# Patient Record
Sex: Female | Born: 1945 | Race: White | Hispanic: No | Smoking: Never smoker
Health system: Southern US, Community
[De-identification: ages and names within clinical notes are randomized; demographics above are authoritative.]

## PROBLEM LIST (undated history)

## (undated) DIAGNOSIS — R32 Unspecified urinary incontinence: Secondary | ICD-10-CM

## (undated) DIAGNOSIS — F329 Major depressive disorder, single episode, unspecified: Secondary | ICD-10-CM

## (undated) DIAGNOSIS — G43909 Migraine, unspecified, not intractable, without status migrainosus: Secondary | ICD-10-CM

## (undated) DIAGNOSIS — Z8052 Family history of malignant neoplasm of bladder: Secondary | ICD-10-CM

## (undated) DIAGNOSIS — F419 Anxiety disorder, unspecified: Secondary | ICD-10-CM

## (undated) DIAGNOSIS — I10 Essential (primary) hypertension: Secondary | ICD-10-CM

## (undated) DIAGNOSIS — N390 Urinary tract infection, site not specified: Secondary | ICD-10-CM

## (undated) DIAGNOSIS — R300 Dysuria: Secondary | ICD-10-CM

## (undated) DIAGNOSIS — Z803 Family history of malignant neoplasm of breast: Secondary | ICD-10-CM

## (undated) DIAGNOSIS — I82409 Acute embolism and thrombosis of unspecified deep veins of unspecified lower extremity: Secondary | ICD-10-CM

## (undated) DIAGNOSIS — Z86718 Personal history of other venous thrombosis and embolism: Secondary | ICD-10-CM

## (undated) DIAGNOSIS — F32A Depression, unspecified: Secondary | ICD-10-CM

## (undated) DIAGNOSIS — C763 Malignant neoplasm of pelvis: Secondary | ICD-10-CM

## (undated) HISTORY — DX: Anxiety disorder, unspecified: F41.9

## (undated) HISTORY — DX: Acute embolism and thrombosis of unspecified deep veins of unspecified lower extremity: I82.409

## (undated) HISTORY — PX: TONSILECTOMY, ADENOIDECTOMY, BILATERAL MYRINGOTOMY AND TUBES: SHX2538

## (undated) HISTORY — DX: Urinary tract infection, site not specified: N39.0

## (undated) HISTORY — DX: Major depressive disorder, single episode, unspecified: F32.9

## (undated) HISTORY — DX: Unspecified urinary incontinence: R32

## (undated) HISTORY — DX: Dysuria: R30.0

## (undated) HISTORY — DX: Personal history of other venous thrombosis and embolism: Z86.718

## (undated) HISTORY — DX: Malignant neoplasm of pelvis: C76.3

## (undated) HISTORY — DX: Migraine, unspecified, not intractable, without status migrainosus: G43.909

## (undated) HISTORY — DX: Depression, unspecified: F32.A

## (undated) HISTORY — DX: Family history of malignant neoplasm of breast: Z80.3

## (undated) HISTORY — DX: Family history of malignant neoplasm of bladder: Z80.52

---

## 2008-01-03 HISTORY — PX: KNEE SURGERY: SHX244

## 2008-02-23 ENCOUNTER — Emergency Department: Payer: Self-pay | Admitting: Internal Medicine

## 2009-11-01 ENCOUNTER — Ambulatory Visit: Payer: Self-pay | Admitting: Family Medicine

## 2011-05-25 ENCOUNTER — Ambulatory Visit: Payer: Self-pay | Admitting: Family Medicine

## 2012-12-04 ENCOUNTER — Ambulatory Visit: Payer: Self-pay | Admitting: Family Medicine

## 2012-12-13 ENCOUNTER — Ambulatory Visit: Payer: Self-pay | Admitting: Family Medicine

## 2012-12-19 ENCOUNTER — Encounter: Payer: Self-pay | Admitting: General Surgery

## 2012-12-19 ENCOUNTER — Ambulatory Visit (INDEPENDENT_AMBULATORY_CARE_PROVIDER_SITE_OTHER): Payer: 59 | Admitting: General Surgery

## 2012-12-19 VITALS — BP 190/100 | HR 80 | Resp 14 | Ht 63.0 in | Wt 161.0 lb

## 2012-12-19 DIAGNOSIS — R920 Mammographic microcalcification found on diagnostic imaging of breast: Secondary | ICD-10-CM

## 2012-12-19 NOTE — Progress Notes (Signed)
Patient ID: Kathy Morton, female   DOB: [birth year], 67 y.o.   MRN: [id number]  Chief Complaint  Patient presents with  . Breast Problem    mammogram    HPI Kathy Morton is a 67 y.o. female.  who presents for a breast evaluation. The most recent mammogram was done on 12-13-12. Her previous mammogram was 2011. Patient does not perform regular self breast checks and does not get regular mammograms done.  She states she can't feel anything in the left breast and they told her it was calcium deposits.  HPI  Past Medical History  Diagnosis Date  . Anxiety   . Depression   . Migraine     Past Surgical History  Procedure Laterality Date  . Knee surgery Left 2010    Family History  Problem Relation Age of Onset  . Breast cancer Maternal Grandmother 80  . Heart attack Father   . Cancer Mother     bladder    Social History History  Substance Use Topics  . Smoking status: Never Smoker   . Smokeless tobacco: Never Used  . Alcohol Use: No    No Known Allergies  Current Outpatient Prescriptions  Medication Sig Dispense Refill  . clonazePAM (KLONOPIN) 1 MG tablet Take 1 tablet by mouth daily.      Marland Kitchen FLUoxetine (PROZAC) 20 MG tablet Take 1 tablet by mouth daily at 2 am.      . nitrofurantoin, macrocrystal-monohydrate, (MACROBID) 100 MG capsule Take 1 capsule by mouth daily at 3 pm.      . polyethylene glycol powder (GLYCOLAX/MIRALAX) powder       . QUEtiapine (SEROQUEL) 100 MG tablet Take 1 tablet by mouth daily at 3 pm.      . SUMAtriptan (IMITREX) 100 MG tablet Take 100 mg by mouth every 2 (two) hours as needed for migraine or headache. May repeat in 2 hours if headache persists or recurs.       No current facility-administered medications for this visit.    Review of Systems Review of Systems  Constitutional: Negative.   Respiratory: Negative.   Cardiovascular: Negative.     Blood pressure 190/100, pulse 80, resp. rate 14, height 5\' 3"  (1.6 m), weight 161 lb  (73.029 kg).  Physical Exam Physical Exam  Constitutional: She is oriented to person, place, and time. She appears well-developed and well-nourished.  Eyes: Conjunctivae are normal. No scleral icterus.  Neck: Neck supple.  Cardiovascular: Normal rate, regular rhythm and normal heart sounds.   Pulmonary/Chest: Effort normal and breath sounds normal. Right breast exhibits no inverted nipple, no mass, no nipple discharge, no skin change and no tenderness. Left breast exhibits no inverted nipple, no mass, no nipple discharge, no skin change and no tenderness.  Lymphadenopathy:    She has no cervical adenopathy.    She has no axillary adenopathy.  Neurological: She is alert and oriented to person, place, and time.  Skin: Skin is warm and dry.    Data Reviewed Mammogram reviewed shows new cluster of mildly suspicious of calcifications left breast.  Assessment    Physical exam is unremarkable.     Plan   Discussed stereotactic biopsy.  The stereotactic procedure was reviewed with the patient. The potential for bleeding, infection and pain was reviewed. At this time, the benefits outweigh the risk, and the patient is amenable to proceed.       SANKAR,SEEPLAPUTHUR G 12/20/2012, 5:49 AM

## 2012-12-19 NOTE — Patient Instructions (Signed)
Stereotactic Breast Biopsy   A stereotactic breast biopsy is a procedure in which mammography is used in the collection of a sample of breast tissue. Mammography is a type of X-ray exam of the breasts that produces an image called a mammogram. The mammogram allows your health care provider to precisely locate the area of the breast from which a tissue sample will be taken. The tissue is then examined under a microscope to see if cancerous cells are present. A breast biopsy is done when:   · A lump, abnormality, or mass is seen in the breast on a breast X-ray (mammogram).    · Small calcium deposits (calcifications) are seen in the breast.    · The shape or appearance of the breasts changes.    · The shape or appearance of the nipples changes. You may have unusual or bloody discharge coming from the nipples, or you may have crusting, retraction, or dimpling of the nipples.  A breast biopsy can indicate if you need surgery or other treatment.    LET YOUR HEALTH CARE PROVIDER KNOW ABOUT:  · Any allergies you have.  · All medicines you are taking, including vitamins, herbs, eye drops, creams, and over-the-counter medicines.  · Previous problems you or members of your family have had with the use of anesthetics.  · Any blood disorders you have.  · Previous surgeries you have had.  · Medical conditions you have.  RISKS AND COMPLICATIONS  Generally, stereotactic breast biopsy is a safe procedure. However, as with any procedure, complications can occur. Possible complications include:  · Infection at the needle-insertion site.    · Bleeding or bruising after surgery.  · The breast may become altered or deformed as a result of the procedure.  · The needle may go through the chest wall into the lung area.    BEFORE THE PROCEDURE  · Wear a supportive bra to the procedure.  · You will be asked to remove jewelry, dentures, eyeglasses, metal objects, or clothing that might interfere with the X-ray images. You may want to leave  some of these objects at home.  · Arrange for someone to drive you home after the procedure if desired.  PROCEDURE   A stereotactic breast biopsy is done while you are awake. During the procedure, relax as much as possible. Let your health care provider know if you are uncomfortable, anxious, or in pain. Usually, the only discomfort felt during the procedure is caused by staying in one position for the length of the procedure. This discomfort can be reduced by carefully placed cushions.  Most of the time the biopsy is done using a table with openings on it. You will be asked to lie facedown on the table and place your breasts through the openings. Your breast is compressed between metal plates to get good X-ray images. Your skin will be cleaned, and a numbing medicine (local anesthetic) will be injected. A small cut (incision) will be made in your breast. The tip of the biopsy needle will be directed through the incision. Several small pieces of suspicious tissue will be taken. Then, a final set of X-ray images will be obtained. If they show that the suspicious tissue has been mostly or completely removed, a small clip will be left at the biopsy site. This is done so that the biopsy site can be easily located if the results of the biopsy show that the tissue is cancerous.   After the procedure, the incision will be stitched (sutured) or taped and covered   with a bandage (dressing). Your health care provider may apply a pressure dressing and an ice pack to prevent bleeding and swelling in the breast.   A stereotactic breast biopsy can take 30 minutes or more.  AFTER THE PROCEDURE   If you are doing well and have no problems, you will be allowed to go home.   Document Released: 09/17/2002 Document Revised: 08/21/2012 Document Reviewed: 07/18/2012  [employer]® Patient Information ©2014 [employer].

## 2012-12-20 ENCOUNTER — Encounter: Payer: Self-pay | Admitting: General Surgery

## 2012-12-20 ENCOUNTER — Other Ambulatory Visit: Payer: Self-pay | Admitting: *Deleted

## 2012-12-20 DIAGNOSIS — R920 Mammographic microcalcification found on diagnostic imaging of breast: Secondary | ICD-10-CM

## 2012-12-20 NOTE — Progress Notes (Signed)
Patient has been scheduled for a left breast stereotactic biopsy at [facility] for 01-06-13 at 2 pm. She will check-in at the [facility] at 1:30 pm. This patient is aware of date, time, and instructions. Patient verbalizes understanding.

## 2012-12-25 ENCOUNTER — Ambulatory Visit: Payer: Self-pay | Admitting: General Surgery

## 2013-01-06 ENCOUNTER — Ambulatory Visit: Payer: Self-pay | Admitting: General Surgery

## 2013-01-06 DIAGNOSIS — R920 Mammographic microcalcification found on diagnostic imaging of breast: Secondary | ICD-10-CM

## 2013-01-06 HISTORY — PX: BREAST BIOPSY: SHX20

## 2013-01-07 LAB — PATHOLOGY REPORT

## 2013-01-08 ENCOUNTER — Telehealth: Payer: Self-pay | Admitting: *Deleted

## 2013-01-08 ENCOUNTER — Encounter: Payer: Self-pay | Admitting: General Surgery

## 2013-01-08 NOTE — Telephone Encounter (Signed)
Notified patient as instructed, patient pleased. Discussed follow-up appointments, patient agrees. Placed in recalls.  

## 2013-01-08 NOTE — Telephone Encounter (Signed)
Message copied by Carson Myrtle on Wed Jan 08, 2013  4:00 PM ------      Message from: Christene Lye      Created: Wed Jan 08, 2013  2:26 PM       Please let pt pt know the pathology was normal. Set up left breast diag mammogram in 5 mos-appt after ------

## 2013-01-13 ENCOUNTER — Ambulatory Visit (INDEPENDENT_AMBULATORY_CARE_PROVIDER_SITE_OTHER): Payer: 59 | Admitting: *Deleted

## 2013-01-13 DIAGNOSIS — R920 Mammographic microcalcification found on diagnostic imaging of breast: Secondary | ICD-10-CM

## 2013-01-13 NOTE — Progress Notes (Signed)
Patient here today for follow up post left breast biopsy. Dressing removed, steristrip in place and patient aware it may come off in one week. Large bruising noted. The patient is aware that a heating pad may be used for comfort as needed.  Aware of pathology. Patient placed in recalls for follow up in June.

## 2013-06-24 ENCOUNTER — Encounter: Payer: Self-pay | Admitting: General Surgery

## 2013-07-02 ENCOUNTER — Ambulatory Visit: Payer: 59 | Admitting: General Surgery

## 2013-07-08 ENCOUNTER — Ambulatory Visit (INDEPENDENT_AMBULATORY_CARE_PROVIDER_SITE_OTHER): Payer: 59 | Admitting: General Surgery

## 2013-07-08 ENCOUNTER — Encounter: Payer: Self-pay | Admitting: General Surgery

## 2013-07-08 VITALS — BP 126/72 | HR 74 | Resp 12 | Ht 63.0 in | Wt 171.0 lb

## 2013-07-08 DIAGNOSIS — R920 Mammographic microcalcification found on diagnostic imaging of breast: Secondary | ICD-10-CM

## 2013-07-08 NOTE — Progress Notes (Signed)
Patient ID: Kathy Morton, female   DOB: March 30, 1945, 68 y.o.   MRN: [id number]  Chief Complaint  Patient presents with  . Follow-up    mammogram    HPI Kathy Morton is a 68 y.o. female who presents for a breast evaluation. The most recent left breast mammogram was done on 06/20/13. Patient does perform regular self breast checks and gets regular mammograms done.  60mos ago she has core vbiopsy left breast-benign finding of sclerosing adenosis  HPI  Past Medical History  Diagnosis Date  . Anxiety   . Depression   . Migraine     Past Surgical History  Procedure Laterality Date  . Knee surgery Left 2010    Family History  Problem Relation Age of Onset  . Breast cancer Maternal Grandmother 80  . Heart attack Father   . Cancer Mother     bladder    Social History History  Substance Use Topics  . Smoking status: Never Smoker   . Smokeless tobacco: Never Used  . Alcohol Use: No    No Known Allergies  Current Outpatient Prescriptions  Medication Sig Dispense Refill  . clonazePAM (KLONOPIN) 1 MG tablet Take 1 tablet by mouth daily.      Marland Kitchen FLUoxetine (PROZAC) 20 MG tablet Take 1 tablet by mouth daily at 2 am.      . nitrofurantoin, macrocrystal-monohydrate, (MACROBID) 100 MG capsule Take 1 capsule by mouth daily at 3 pm.      . polyethylene glycol powder (GLYCOLAX/MIRALAX) powder       . QUEtiapine (SEROQUEL) 100 MG tablet Take 1 tablet by mouth daily at 3 pm.      . SUMAtriptan (IMITREX) 100 MG tablet Take 100 mg by mouth every 2 (two) hours as needed for migraine or headache. May repeat in 2 hours if headache persists or recurs.       No current facility-administered medications for this visit.    Review of Systems Review of Systems  Constitutional: Negative.   Respiratory: Negative.   Cardiovascular: Negative.     Blood pressure 126/72, pulse 74, resp. rate 12, height 5\' 3"  (1.6 m), weight 171 lb (77.565 kg).  Physical Exam Physical Exam   Constitutional: She is oriented to person, place, and time. She appears well-nourished.  Eyes: Conjunctivae are normal. No scleral icterus.  Neck: Neck supple.  Cardiovascular: Normal rate, regular rhythm and normal heart sounds.   Pulmonary/Chest: Effort normal and breath sounds normal. Right breast exhibits no inverted nipple, no mass, no nipple discharge, no skin change and no tenderness. Left breast exhibits no inverted nipple, no mass, no nipple discharge, no skin change and no tenderness.  Abdominal: Soft. Bowel sounds are normal. There is no tenderness.  Lymphadenopathy:    She has no cervical adenopathy.    She has no axillary adenopathy.  Neurological: She is alert and oriented to person, place, and time.  Skin: Skin is warm and dry.    Data Reviewed Mammogram reviewed. Stable.  Assessment    Stable exam, FCD, remote FH of breast cancer    Plan    The patient has been asked to return to the office in six month with a bilateral diagnotic mammogram.       Holleigh Crihfield G 07/08/2013, 3:38 PM

## 2013-07-08 NOTE — Patient Instructions (Signed)
The patient has been asked to return to the office in six month with a bilateral diagnotic mammogram.

## 2013-09-01 ENCOUNTER — Ambulatory Visit: Payer: Self-pay | Admitting: Internal Medicine

## 2013-11-03 ENCOUNTER — Encounter: Payer: Self-pay | Admitting: General Surgery

## 2014-02-09 ENCOUNTER — Encounter: Payer: Self-pay | Admitting: General Surgery

## 2014-02-09 ENCOUNTER — Ambulatory Visit (INDEPENDENT_AMBULATORY_CARE_PROVIDER_SITE_OTHER): Payer: Medicare Other | Admitting: General Surgery

## 2014-02-09 VITALS — BP 132/78 | HR 78 | Resp 15 | Ht 63.0 in | Wt 183.0 lb

## 2014-02-09 DIAGNOSIS — Z803 Family history of malignant neoplasm of breast: Secondary | ICD-10-CM

## 2014-02-09 DIAGNOSIS — N6022 Fibroadenosis of left breast: Secondary | ICD-10-CM

## 2014-02-09 NOTE — Patient Instructions (Signed)
Continue self breast exams. Call office for any new breast issues or concerns. 

## 2014-02-09 NOTE — Progress Notes (Signed)
Patient ID: Kathy Morton, female   DOB: 08/13/1945, 69 y.o.   MRN: [id number]  Chief Complaint  Patient presents with  . Follow-up    mammogram     HPI Kathy Morton is a 69 y.o. female  who presents for a breast evaluation. The most recent mammogram was done on 02/06/14. Patient does perform regular self breast checks and gets regular mammograms done. She reports no new breast problems.     HPI  Past Medical History  Diagnosis Date  . Anxiety   . Depression   . Migraine     Past Surgical History  Procedure Laterality Date  . Knee surgery Left 2010    torn meniscus  . Breast biopsy Left 01/06/13    benign finding of sclerosing adenosis    Family History  Problem Relation Age of Onset  . Breast cancer Maternal Grandmother 80  . Heart attack Father   . Cancer Mother     bladder    Social History History  Substance Use Topics  . Smoking status: Never Smoker   . Smokeless tobacco: Never Used  . Alcohol Use: No    No Known Allergies  Current Outpatient Prescriptions  Medication Sig Dispense Refill  . clonazePAM (KLONOPIN) 1 MG tablet Take 1 tablet by mouth daily.    Marland Kitchen FLUoxetine (PROZAC) 20 MG tablet Take 1 tablet by mouth daily at 2 am.    . glucosamine-chondroitin 500-400 MG tablet Take 1 tablet by mouth daily.    . QUEtiapine (SEROQUEL) 100 MG tablet Take 1 tablet by mouth daily at 3 pm.    . SUMAtriptan (IMITREX) 100 MG tablet Take 100 mg by mouth every 2 (two) hours as needed for migraine or headache. May repeat in 2 hours if headache persists or recurs.     No current facility-administered medications for this visit.    Review of Systems Review of Systems  Constitutional: Negative.   HENT: Negative.   Respiratory: Negative.     Blood pressure 132/78, pulse 78, resp. rate 15, height 5\' 3"  (1.6 m), weight 183 lb (83.008 kg).  Physical Exam Physical Exam  Constitutional: She is oriented to person, place, and time. She appears well-developed and  well-nourished.  Eyes: Conjunctivae are normal. No scleral icterus.  Neck: Neck supple. No thyromegaly present.  Cardiovascular: Normal rate, regular rhythm and normal heart sounds.   Pulmonary/Chest: Effort normal and breath sounds normal. Right breast exhibits no inverted nipple, no mass, no nipple discharge, no skin change and no tenderness. Left breast exhibits no inverted nipple, no mass, no nipple discharge, no skin change and no tenderness.  Abdominal: Soft. Bowel sounds are normal.  Lymphadenopathy:    She has no cervical adenopathy.    She has no axillary adenopathy.  Neurological: She is alert and oriented to person, place, and time.  Skin: Skin is warm and dry.    Data Reviewed Mammogram reviewed  Assessment    Stable Exam. 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 Family history of breast CA.    Plan    Schedule follow up in one year with screening mammogram.        SANKAR,SEEPLAPUTHUR G 02/09/2014, 11:18 AM

## 2014-02-10 ENCOUNTER — Encounter: Payer: Self-pay | Admitting: General Surgery

## 2014-04-27 ENCOUNTER — Ambulatory Visit (INDEPENDENT_AMBULATORY_CARE_PROVIDER_SITE_OTHER): Payer: Medicare Other

## 2014-04-27 ENCOUNTER — Ambulatory Visit (INDEPENDENT_AMBULATORY_CARE_PROVIDER_SITE_OTHER): Payer: Medicare Other | Admitting: Podiatry

## 2014-04-27 VITALS — BP 176/94 | HR 68 | Resp 16 | Ht 64.0 in | Wt 179.0 lb

## 2014-04-27 DIAGNOSIS — M722 Plantar fascial fibromatosis: Secondary | ICD-10-CM

## 2014-04-27 MED ORDER — METHYLPREDNISOLONE 4 MG PO TBPK: ORAL_TABLET | ORAL | Status: DC

## 2014-04-27 MED ORDER — MELOXICAM 15 MG PO TABS: 15.0000 mg | ORAL_TABLET | Freq: Every day | ORAL | Status: DC

## 2014-04-27 NOTE — Patient Instructions (Signed)

## 2014-04-27 NOTE — Progress Notes (Signed)
   Subjective:    Patient ID: Kathy Morton, female    DOB: [birth year], 69 y.o.   MRN: [id number]  HPI right plantar heel pain , it has been going on for about 3-4 weeks now, came on suddenly.  Had a heel spur thirty years ago. Have old orthotics.     Review of Systems  All other systems reviewed and are negative.      Objective:   Physical Exam: I have reviewed her past mental history medications allergies surgery social history and review of systems. Pulses are strongly palpable bilateral. Neurologic sensorium is intact percent lasting monofilament. Deep tendon reflexes are intact bilaterally muscle strength is 5 over 5 dorsiflexion plantar flexors and inverters everters all intrinsic musculature is intact. Orthopedic evaluation demonstrates all joints distal to the ankle full range of motion without crepitation. Cutaneous evaluation of Mr. supple well-hydrated cutis. She has pain on palpation medial calcaneal tubercle of the right heel. Radiographs confirm soft tissue increase in density of plantar fascial calcaneal insertion site.        Assessment & Plan:  Assessment: Plantar fasciitis right.  Plan: We discussed etiology pathology conservative versus surgical therapies. We discussed appropriate shoe gear stretching exercises ice therapy issue gear modifications area we injected the right heel today with Kenalog and local anesthetic. Placed her in my splint and a plantar fascial brace. Her on a Medrol Dosepak to be followed by meloxicam. I'll follow-up with her in 1 month.

## 2014-11-09 ENCOUNTER — Encounter: Payer: Self-pay | Admitting: Family Medicine

## 2014-11-09 ENCOUNTER — Ambulatory Visit (INDEPENDENT_AMBULATORY_CARE_PROVIDER_SITE_OTHER): Payer: Medicare Other | Admitting: Family Medicine

## 2014-11-09 VITALS — BP 154/104 | HR 93 | Temp 98.3°F | Resp 16 | Ht 64.0 in | Wt 182.6 lb

## 2014-11-09 DIAGNOSIS — R030 Elevated blood-pressure reading, without diagnosis of hypertension: Secondary | ICD-10-CM

## 2014-11-09 DIAGNOSIS — G43909 Migraine, unspecified, not intractable, without status migrainosus: Secondary | ICD-10-CM | POA: Insufficient documentation

## 2014-11-09 DIAGNOSIS — Z7189 Other specified counseling: Secondary | ICD-10-CM

## 2014-11-09 DIAGNOSIS — Z23 Encounter for immunization: Secondary | ICD-10-CM

## 2014-11-09 DIAGNOSIS — F4310 Post-traumatic stress disorder, unspecified: Secondary | ICD-10-CM | POA: Diagnosis not present

## 2014-11-09 DIAGNOSIS — Z8249 Family history of ischemic heart disease and other diseases of the circulatory system: Secondary | ICD-10-CM | POA: Diagnosis not present

## 2014-11-09 DIAGNOSIS — G43709 Chronic migraine without aura, not intractable, without status migrainosus: Secondary | ICD-10-CM | POA: Diagnosis not present

## 2014-11-09 DIAGNOSIS — Z7689 Persons encountering health services in other specified circumstances: Secondary | ICD-10-CM

## 2014-11-09 DIAGNOSIS — E669 Obesity, unspecified: Secondary | ICD-10-CM | POA: Diagnosis not present

## 2014-11-09 DIAGNOSIS — Z86718 Personal history of other venous thrombosis and embolism: Secondary | ICD-10-CM | POA: Insufficient documentation

## 2014-11-09 DIAGNOSIS — E6609 Other obesity due to excess calories: Secondary | ICD-10-CM | POA: Insufficient documentation

## 2014-11-09 DIAGNOSIS — R5382 Chronic fatigue, unspecified: Secondary | ICD-10-CM | POA: Diagnosis not present

## 2014-11-09 DIAGNOSIS — IMO0001 Reserved for inherently not codable concepts without codable children: Secondary | ICD-10-CM

## 2014-11-09 DIAGNOSIS — Z6833 Body mass index (BMI) 33.0-33.9, adult: Secondary | ICD-10-CM | POA: Insufficient documentation

## 2014-11-09 MED ORDER — CLONAZEPAM 1 MG PO TABS: 1.0000 mg | ORAL_TABLET | Freq: Every day | ORAL | Status: DC

## 2014-11-09 NOTE — Patient Instructions (Signed)
Your goal blood pressure is 140/90 Work on low salt/sodium diet - goal <2.5gm (1,500mg ) per day. Eat a diet high in fruits/vegetables and whole grains.  Look into mediterranean and DASH diet. Goal activity is 174min/wk of moderate intensity exercise.  This can be split into 30 minute chunks.  If you are not at this level, you can start with smaller 10-15 min increments and slowly build up activity. Look at [email or web address] for more resources
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
## 2014-11-09 NOTE — Assessment & Plan Note (Signed)
Continue current imitrex PRN for migraines.

## 2014-11-09 NOTE — Assessment & Plan Note (Signed)
40 years ago post partum. No additional issues.
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
## 2014-11-09 NOTE — Assessment & Plan Note (Signed)
Discussed weight loss strategies. Pt declined [facility]. Pt would like weight loss medication- risks benefits reviewed. Check TSH, lipid, B12.

## 2014-11-12 ENCOUNTER — Telehealth: Payer: Self-pay | Admitting: Family Medicine

## 2014-11-12 LAB — TSH: TSH: 2.16 u[IU]/mL (ref 0.450–4.500)

## 2014-11-12 LAB — COMPREHENSIVE METABOLIC PANEL
A/G RATIO: 1.8 (ref 1.1–2.5)
ALBUMIN: 4.7 g/dL (ref 3.6–4.8)
ALK PHOS: 112 IU/L (ref 39–117)
ALT: 14 IU/L (ref 0–32)
AST: 19 IU/L (ref 0–40)
BUN/Creatinine Ratio: 17 (ref 11–26)
BUN: 13 mg/dL (ref 8–27)
Bilirubin Total: <0.2 mg/dL (ref 0.0–1.2)
CHLORIDE: 100 mmol/L (ref 97–106)
CO2: 21 mmol/L (ref 18–29)
Calcium: 9.6 mg/dL (ref 8.7–10.3)
Creatinine, Ser: 0.76 mg/dL (ref 0.57–1.00)
GFR calc Af Amer: 93 mL/min/{1.73_m2} (ref >59)
GFR calc non Af Amer: 80 mL/min/{1.73_m2} (ref >59)
GLOBULIN, TOTAL: 2.6 g/dL (ref 1.5–4.5)
Glucose: 95 mg/dL (ref 65–99)
Potassium: 4 mmol/L (ref 3.5–5.2)
SODIUM: 141 mmol/L (ref 136–144)
Total Protein: 7.3 g/dL (ref 6.0–8.5)

## 2014-11-12 LAB — LIPID PANEL
CHOLESTEROL TOTAL: 242 mg/dL — AB (ref 100–199)
Chol/HDL Ratio: 4.5 ratio units — ABNORMAL HIGH (ref 0.0–4.4)
HDL: 54 mg/dL (ref >39)
LDL Calculated: 145 mg/dL — ABNORMAL HIGH (ref 0–99)
Triglycerides: 215 mg/dL — ABNORMAL HIGH (ref 0–149)
VLDL Cholesterol Cal: 43 mg/dL — ABNORMAL HIGH (ref 5–40)

## 2014-11-12 LAB — VITAMIN D 25 HYDROXY (VIT D DEFICIENCY, FRACTURES): Vit D, 25-Hydroxy: 29.4 ng/mL — ABNORMAL LOW (ref 30.0–100.0)

## 2014-11-12 LAB — VITAMIN B12: Vitamin B-12: 261 pg/mL (ref 211–946)

## 2014-11-12 NOTE — Telephone Encounter (Signed)
Pt requested a call explaining her lab results in detail.  Her call back number is [phone]

## 2014-11-12 NOTE — Telephone Encounter (Signed)
Explain cholesterol # to patient thoroughly. She was concerned and called back for more detail.

## 2014-12-07 ENCOUNTER — Ambulatory Visit: Payer: Medicare Other | Admitting: Family Medicine

## 2014-12-14 ENCOUNTER — Encounter: Payer: Self-pay | Admitting: Family Medicine

## 2014-12-14 ENCOUNTER — Telehealth: Payer: Self-pay | Admitting: Family Medicine

## 2014-12-14 ENCOUNTER — Ambulatory Visit (INDEPENDENT_AMBULATORY_CARE_PROVIDER_SITE_OTHER): Payer: Medicare Other | Admitting: Family Medicine

## 2014-12-14 VITALS — BP 160/98 | HR 92 | Temp 98.2°F | Resp 16 | Ht 64.0 in | Wt 180.2 lb

## 2014-12-14 DIAGNOSIS — E669 Obesity, unspecified: Secondary | ICD-10-CM | POA: Diagnosis not present

## 2014-12-14 DIAGNOSIS — G4700 Insomnia, unspecified: Secondary | ICD-10-CM

## 2014-12-14 DIAGNOSIS — K5909 Other constipation: Secondary | ICD-10-CM

## 2014-12-14 DIAGNOSIS — I10 Essential (primary) hypertension: Secondary | ICD-10-CM | POA: Diagnosis not present

## 2014-12-14 DIAGNOSIS — E785 Hyperlipidemia, unspecified: Secondary | ICD-10-CM | POA: Insufficient documentation

## 2014-12-14 DIAGNOSIS — K5900 Constipation, unspecified: Secondary | ICD-10-CM | POA: Diagnosis not present

## 2014-12-14 MED ORDER — LIRAGLUTIDE -WEIGHT MANAGEMENT 18 MG/3ML ~~LOC~~ SOPN: 3.0000 mg | PEN_INJECTOR | Freq: Every day | SUBCUTANEOUS | Status: DC

## 2014-12-14 MED ORDER — MELATONIN 3 MG PO CAPS: 2.0000 | ORAL_CAPSULE | Freq: Every day | ORAL | Status: DC

## 2014-12-14 MED ORDER — ATORVASTATIN CALCIUM 20 MG PO TABS: 20.0000 mg | ORAL_TABLET | Freq: Every day | ORAL | Status: DC

## 2014-12-14 MED ORDER — LUBIPROSTONE 24 MCG PO CAPS: 24.0000 ug | ORAL_CAPSULE | Freq: Two times a day (BID) | ORAL | Status: DC

## 2014-12-14 MED ORDER — LISINOPRIL 10 MG PO TABS: 10.0000 mg | ORAL_TABLET | Freq: Every day | ORAL | Status: DC

## 2014-12-14 NOTE — Progress Notes (Signed)
Subjective:    Patient ID: Kathy Morton, female    DOB: 08-13-1945, 69 y.o.   MRN: [id number]  HPI: Kathy Morton is a 69 y.o. female presenting on 12/14/2014 for Hypertension; Hyperlipidemia; and Obesity   Hypertension This is a new problem. The current episode started 1 to 4 weeks ago. The problem has been waxing and waning since onset. Pertinent negatives include no anxiety, chest pain, headaches, neck pain, orthopnea, palpitations, peripheral edema, shortness of breath or sweats. Risk factors for coronary artery disease include dyslipidemia and post-menopausal state. Past treatments include nothing. There is no history of chronic renal disease.  Hyperlipidemia This is a new problem. Recent lipid tests were reviewed and are high. Exacerbating diseases include obesity. She has no history of chronic renal disease or diabetes. Pertinent negatives include no chest pain, focal sensory loss, focal weakness, leg pain, myalgias or shortness of breath. She is currently on no antihyperlipidemic treatment. There are no compliance problems.  Risk factors for coronary artery disease include post-menopausal.    Pt presents for follow-up of BP. She has been checking BP at home- anywhere from 170/100 to 108-76.  Avg diastolic 123456. No CP, SOB, visual changes.   Obesity: 40 lb weight gain in the past few years. Is struggling with diet and exercise to keep the weight off. TSH is normal. B12 is noraml but low. SHe is exercising regularly and recently bought a treadmill.   Cholesterol medication- discuss starting statin. Cholesterol is high.  Chronic constipation- taking miralax. Would like to discuss amitiza to help with symptoms. 1 Bm every 2-3 days with use of miralax daily.  Insomnia- early morning awakenings. Is trying sleep hygiene.    Past Medical History  Diagnosis Date  . Anxiety   . Depression   . Migraine   . History of blood clots   . Incontinence     Current Outpatient  Prescriptions on File Prior to Visit  Medication Sig  . clonazePAM (KLONOPIN) 1 MG tablet Take 1 tablet (1 mg total) by mouth daily.  . polyethylene glycol powder (GLYCOLAX/MIRALAX) powder TAKE 17 GRAM MIXED WITH 8 OZ. WATER, JUICE, SODA, COFFEE OR TEA BY ORAL ROUTE ONCE DAILY  . QUEtiapine (SEROQUEL) 100 MG tablet Take 1 tablet by mouth daily at 3 pm.  . SUMAtriptan (IMITREX) 100 MG tablet Take 100 mg by mouth every 2 (two) hours as needed for migraine or headache. May repeat in 2 hours if headache persists or recurs.  . valACYclovir (VALTREX) 1000 MG tablet Take 2,000 mg by mouth every 12 (twelve) hours.   No current facility-administered medications on file prior to visit.    Review of Systems  Constitutional: Positive for unexpected weight change. Negative for fever and chills.  HENT: Negative.   Respiratory: Negative for shortness of breath.   Cardiovascular: Negative for chest pain, palpitations, orthopnea and leg swelling.  Gastrointestinal: Positive for constipation. Negative for abdominal pain and abdominal distention.  Endocrine: Negative for cold intolerance, heat intolerance, polydipsia, polyphagia and polyuria.  Genitourinary: Negative.   Musculoskeletal: Negative.  Negative for myalgias and neck pain.  Neurological: Negative for dizziness, focal weakness, numbness and headaches.  Psychiatric/Behavioral: Positive for sleep disturbance.   Per HPI unless specifically indicated above     Objective:    BP 160/98 mmHg  Pulse 92  Temp(Src) 98.2 F (36.8 C) (Oral)  Resp 16  Ht 5\' 4"  (1.626 m)  Wt 180 lb 3.2 oz (81.738 kg)  BMI 30.92 kg/m2  Wt Readings  from Last 3 Encounters:  12/14/14 180 lb 3.2 oz (81.738 kg)  11/09/14 182 lb 9.6 oz (82.827 kg)  04/27/14 179 lb (81.194 kg)    Physical Exam  Constitutional: She is oriented to person, place, and time. She appears well-developed and well-nourished.  HENT:  Head: Normocephalic and atraumatic.  Neck: Neck supple.    Cardiovascular: Normal rate, regular rhythm and normal heart sounds.  Exam reveals no gallop and no friction rub.   No murmur heard. Pulmonary/Chest: Effort normal and breath sounds normal. She has no wheezes. She exhibits no tenderness.  Abdominal: Soft. Normal appearance and bowel sounds are normal. She exhibits no distension and no mass. There is no tenderness. There is no rebound and no guarding.  Musculoskeletal: Normal range of motion. She exhibits no edema or tenderness.  Lymphadenopathy:    She has no cervical adenopathy.  Neurological: She is alert and oriented to person, place, and time.  Skin: Skin is warm and dry.   Results for orders placed or performed in visit on 11/09/14  Lipid panel  Result Value Ref Range   Cholesterol, Total 242 (H) 100 - 199 mg/dL   Triglycerides 215 (H) 0 - 149 mg/dL   HDL 54 >39 mg/dL   VLDL Cholesterol Cal 43 (H) 5 - 40 mg/dL   LDL Calculated 145 (H) 0 - 99 mg/dL   Chol/HDL Ratio 4.5 (H) 0.0 - 4.4 ratio units  Comprehensive metabolic panel  Result Value Ref Range   Glucose 95 65 - 99 mg/dL   BUN 13 8 - 27 mg/dL   Creatinine, Ser 0.76 0.57 - 1.00 mg/dL   GFR calc non Af Amer 80 >59 mL/min/1.73   GFR calc Af Amer 93 >59 mL/min/1.73   BUN/Creatinine Ratio 17 11 - 26   Sodium 141 136 - 144 mmol/L   Potassium 4.0 3.5 - 5.2 mmol/L   Chloride 100 97 - 106 mmol/L   CO2 21 18 - 29 mmol/L   Calcium 9.6 8.7 - 10.3 mg/dL   Total Protein 7.3 6.0 - 8.5 g/dL   Albumin 4.7 3.6 - 4.8 g/dL   Globulin, Total 2.6 1.5 - 4.5 g/dL   Albumin/Globulin Ratio 1.8 1.1 - 2.5   Bilirubin Total <0.2 0.0 - 1.2 mg/dL   Alkaline Phosphatase 112 39 - 117 IU/L   AST 19 0 - 40 IU/L   ALT 14 0 - 32 IU/L  TSH  Result Value Ref Range   TSH 2.160 0.450 - 4.500 uIU/mL  Vit D  25 hydroxy (rtn osteoporosis monitoring)  Result Value Ref Range   Vit D, 25-Hydroxy 29.4 (L) 30.0 - 100.0 ng/mL  Vitamin B12  Result Value Ref Range   Vitamin B-12 261 211 - 946 pg/mL       Assessment & Plan:   Problem List Items Addressed This Visit      Cardiovascular and Mediastinum   Hypertension - Primary    Start lisinopril today 10mg . Pt will continue to monitor BP at home. DASH diet and exercise reviewed.  Alarm symptoms reviewed. RTC 6 weeks- check BMET      Relevant Medications   lisinopril (PRINIVIL,ZESTRIL) 10 MG tablet   atorvastatin (LIPITOR) 20 MG tablet     Digestive   Chronic constipation    Trial of Amitza to help with constipation.       Relevant Medications   lubiprostone (AMITIZA) 24 MCG capsule     Other   Obesity    Trial of Saxenda for weight  loss x 6 weeks. Encouraged oral vitamin B12 and vitamin D for energy booster.  Consider referral to [facility].       Relevant Medications   Liraglutide -Weight Management (SAXENDA) 18 MG/3ML SOPN   Hyperlipidemia    Start Statin today. Risks and benefits reviewed. Diet and lifestyle changes reviewed.  Side effects of statins reviewed. Check Lipid and CMET in 6 weeks.       Relevant Medications   lisinopril (PRINIVIL,ZESTRIL) 10 MG tablet   atorvastatin (LIPITOR) 20 MG tablet   Insomnia    Continue seroquel to help with sleep. Augment with melatonin to reduce early morning awakenings.  Consider low dose anti-depressant to help with insomnia. Suspect underlying depression/anxiety.          Meds ordered this encounter  Medications  . lisinopril (PRINIVIL,ZESTRIL) 10 MG tablet    Sig: Take 1 tablet (10 mg total) by mouth daily.    Dispense:  90 tablet    Refill:  3    Order Specific Question:  Supervising Provider    Answer:  Arlis Porta [phone]  . atorvastatin (LIPITOR) 20 MG tablet    Sig: Take 1 tablet (20 mg total) by mouth daily.    Dispense:  90 tablet    Refill:  3    Order Specific Question:  Supervising Provider    Answer:  Arlis Porta [phone]  . Liraglutide -Weight Management (SAXENDA) 18 MG/3ML SOPN    Sig: Inject 3 mg into the skin daily.     Dispense:  18 mL    Refill:  11    Order Specific Question:  Supervising Provider    Answer:  Arlis Porta [phone]  . lubiprostone (AMITIZA) 24 MCG capsule    Sig: Take 1 capsule (24 mcg total) by mouth 2 (two) times daily with a meal.    Dispense:  60 capsule    Refill:  11    Order Specific Question:  Supervising Provider    Answer:  Arlis Porta [phone]  . Melatonin 3 MG CAPS    Sig: Take 2 capsules (6 mg total) by mouth daily. Take 1-2 capsules at bedtime.    Refill:  0    Order Specific Question:  Supervising Provider    Answer:  Arlis Porta [phone]      Follow up plan: Return in about 6 weeks (around 01/25/2015) for BP check, hyperlipidemia. Marland Kitchen

## 2014-12-14 NOTE — Assessment & Plan Note (Signed)
Continue seroquel to help with sleep. Augment with melatonin to reduce early morning awakenings.  Consider low dose anti-depressant to help with insomnia. Suspect underlying depression/anxiety.

## 2014-12-14 NOTE — Assessment & Plan Note (Signed)
Start lisinopril today 10mg . Pt will continue to monitor BP at home. DASH diet and exercise reviewed.  Alarm symptoms reviewed. RTC 6 weeks- check BMET

## 2014-12-14 NOTE — Assessment & Plan Note (Signed)
Start Statin today. Risks and benefits reviewed. Diet and lifestyle changes reviewed.  Side effects of statins reviewed. Check Lipid and CMET in 6 weeks.

## 2014-12-14 NOTE — Patient Instructions (Addendum)
[email or web address]  Your goal blood pressure is 150/90. Work on low salt/sodium diet - goal <1.5gm (1,500mg ) per day. Eat a diet high in fruits/vegetables and whole grains.  Look into mediterranean and DASH diet. Goal activity is 152min/wk of moderate intensity exercise.  This can be split into 30 minute chunks.  If you are not at this level, you can start with smaller 10-15 min increments and slowly build up activity. Look at [place].org for more resources  Please seek immediate medical attention at ER or Urgent Care if you develop: Chest pain, pressure or tightness. Shortness of breath accompanied by nausea or diaphoresis Visual changes Numbness or tingling on one side of the body Facial droop Altered mental status Or any concerning symptoms.    Try Melatonin for sleep- take 1-2 capsules at bedtime.

## 2014-12-14 NOTE — Telephone Encounter (Signed)
Pt has questions about the weight loss injection the you prescribed.  Please call [phone]

## 2014-12-14 NOTE — Assessment & Plan Note (Signed)
Trial of Amitza to help with constipation.

## 2014-12-14 NOTE — Assessment & Plan Note (Signed)
Trial of Saxenda for weight loss x 6 weeks. Encouraged oral vitamin B12 and vitamin D for energy booster.  Consider referral to [facility].

## 2014-12-15 NOTE — Telephone Encounter (Signed)
Pt called in regards to the copay for [place] it was $1000. Have told her unfortunately there is nothing we can do to help get her co-pay reduced. Encouraged her to work with pharmacy help desk with her insurance. Thanks! AK

## 2014-12-15 NOTE — Telephone Encounter (Signed)
Called pt. LMTCB 

## 2014-12-18 NOTE — Telephone Encounter (Signed)
Called pt to inform her that coverage for Saxenda was denied.

## 2014-12-22 ENCOUNTER — Encounter: Payer: Self-pay | Admitting: *Deleted

## 2015-02-01 ENCOUNTER — Ambulatory Visit (INDEPENDENT_AMBULATORY_CARE_PROVIDER_SITE_OTHER): Payer: Medicare Other | Admitting: Family Medicine

## 2015-02-01 VITALS — BP 133/85 | HR 99 | Temp 98.5°F | Resp 16 | Ht 64.0 in | Wt 183.0 lb

## 2015-02-01 DIAGNOSIS — I10 Essential (primary) hypertension: Secondary | ICD-10-CM | POA: Diagnosis not present

## 2015-02-01 DIAGNOSIS — E669 Obesity, unspecified: Secondary | ICD-10-CM

## 2015-02-01 MED ORDER — NALTREXONE-BUPROPION HCL ER 8-90 MG PO TB12: ORAL_TABLET | ORAL | Status: DC

## 2015-02-01 NOTE — Assessment & Plan Note (Signed)
Controlled in office. Check BMET for potassium levels. Recheck 3 mos.

## 2015-02-01 NOTE — Patient Instructions (Addendum)
Let's try the contrave for weight loss.  Week one: 1 pill in the AM Week two: 1 in AM and 1 in PM Week three: 2 pills in the AM, 1 pill in the PM Week four: 2 pills in the AM, 2 pills in the PM.     Please try to meet the goal of 150 minutes of exercise per week.  This is generally 30-40 minutes of moderate activity 3-4 times per week. Consider a calorie goal of 1800 per day. Eat mainly fruits, vegetables, and lean proteins (chicken or fish).  A great resource for healthy living and weight loss is [employer]gov

## 2015-02-01 NOTE — Progress Notes (Signed)
Subjective:    Patient ID: Kathy Morton, female    DOB: [birth year], 70 y.o.   MRN: [id number]  HPI: Kathy Morton is a 70 y.o. female presenting on 02/01/2015 for Hypertension and Obesity   HPI  Pt presents for hypertension follow-up. Overall doing well on 10mg  daily. No side effects. No CP, SOB,   She is very concerned about her weight. She wanted to try a weight loss medication at her last visit but she could not afford it. She has declined to go to [facility] multiple times. Exercise- 1 mile walking daily on treadmill. Diet: She is not trying anything for diet. When she does try- soup for dinner, salad for lunch, and toast for breakfast. Goal is 160 lbs. She is interested in weight loss medication.  No history of seizures.    Past Medical History  Diagnosis Date  . Anxiety   . Depression   . Migraine   . History of blood clots   . Incontinence     Current Outpatient Prescriptions on File Prior to Visit  Medication Sig  . atorvastatin (LIPITOR) 20 MG tablet Take 1 tablet (20 mg total) by mouth daily.  . clonazePAM (KLONOPIN) 1 MG tablet Take 1 tablet (1 mg total) by mouth daily.  Marland Kitchen lisinopril (PRINIVIL,ZESTRIL) 10 MG tablet Take 1 tablet (10 mg total) by mouth daily.  Marland Kitchen lubiprostone (AMITIZA) 24 MCG capsule Take 1 capsule (24 mcg total) by mouth 2 (two) times daily with a meal.  . Melatonin 3 MG CAPS Take 2 capsules (6 mg total) by mouth daily. Take 1-2 capsules at bedtime.  . polyethylene glycol powder (GLYCOLAX/MIRALAX) powder TAKE 17 GRAM MIXED WITH 8 OZ. WATER, JUICE, SODA, COFFEE OR TEA BY ORAL ROUTE ONCE DAILY  . QUEtiapine (SEROQUEL) 100 MG tablet Take 1 tablet by mouth daily at 3 pm.  . SUMAtriptan (IMITREX) 100 MG tablet Take 100 mg by mouth every 2 (two) hours as needed for migraine or headache. May repeat in 2 hours if headache persists or recurs.  . valACYclovir (VALTREX) 1000 MG tablet Take 2,000 mg by mouth every 12 (twelve) hours.   No current  facility-administered medications on file prior to visit.    Review of Systems  Constitutional: Negative for fever and chills.  HENT: Negative.   Respiratory: Negative for cough, chest tightness and wheezing.   Cardiovascular: Negative for chest pain and leg swelling.  Gastrointestinal: Negative for nausea, vomiting, abdominal pain, diarrhea and constipation.  Endocrine: Negative.  Negative for cold intolerance, heat intolerance, polydipsia, polyphagia and polyuria.  Genitourinary: Negative for dysuria and difficulty urinating.  Musculoskeletal: Negative.   Neurological: Negative for dizziness, light-headedness and numbness.  Psychiatric/Behavioral: Negative.    Per HPI unless specifically indicated above     Objective:    BP 133/85 mmHg  Pulse 99  Temp(Src) 98.5 F (36.9 C) (Oral)  Resp 16  Ht 5\' 4"  (1.626 m)  Wt 183 lb (83.008 kg)  BMI 31.40 kg/m2  Wt Readings from Last 3 Encounters:  02/01/15 183 lb (83.008 kg)  12/14/14 180 lb 3.2 oz (81.738 kg)  11/09/14 182 lb 9.6 oz (82.827 kg)    Physical Exam Results for orders placed or performed in visit on 11/09/14  Lipid panel  Result Value Ref Range   Cholesterol, Total 242 (H) 100 - 199 mg/dL   Triglycerides 215 (H) 0 - 149 mg/dL   HDL 54 >39 mg/dL   VLDL Cholesterol Cal 43 (H) 5 - 40 mg/dL  LDL Calculated 145 (H) 0 - 99 mg/dL   Chol/HDL Ratio 4.5 (H) 0.0 - 4.4 ratio units  Comprehensive metabolic panel  Result Value Ref Range   Glucose 95 65 - 99 mg/dL   BUN 13 8 - 27 mg/dL   Creatinine, Ser 0.76 0.57 - 1.00 mg/dL   GFR calc non Af Amer 80 >59 mL/min/1.73   GFR calc Af Amer 93 >59 mL/min/1.73   BUN/Creatinine Ratio 17 11 - 26   Sodium 141 136 - 144 mmol/L   Potassium 4.0 3.5 - 5.2 mmol/L   Chloride 100 97 - 106 mmol/L   CO2 21 18 - 29 mmol/L   Calcium 9.6 8.7 - 10.3 mg/dL   Total Protein 7.3 6.0 - 8.5 g/dL   Albumin 4.7 3.6 - 4.8 g/dL   Globulin, Total 2.6 1.5 - 4.5 g/dL   Albumin/Globulin Ratio 1.8 1.1 -  2.5   Bilirubin Total <0.2 0.0 - 1.2 mg/dL   Alkaline Phosphatase 112 39 - 117 IU/L   AST 19 0 - 40 IU/L   ALT 14 0 - 32 IU/L  TSH  Result Value Ref Range   TSH 2.160 0.450 - 4.500 uIU/mL  Vit D  25 hydroxy (rtn osteoporosis monitoring)  Result Value Ref Range   Vit D, 25-Hydroxy 29.4 (L) 30.0 - 100.0 ng/mL  Vitamin B12  Result Value Ref Range   Vitamin B-12 261 211 - 946 pg/mL      Assessment & Plan:   Problem List Items Addressed This Visit      Cardiovascular and Mediastinum   Hypertension    Controlled in office. Check BMET for potassium levels. Recheck 3 mos.       Relevant Orders   Basic Metabolic Panel (BMET)     Other   Obesity - Primary    Pt would like to try Contrave for weight loss. Discussed risks vs. Benefits. Encouraged diet and exercise changes.  Dosing reviewed. Pt to follow-up in 6 weeks.       Relevant Medications   Naltrexone-Bupropion HCl ER 8-90 MG TB12      Meds ordered this encounter  Medications  . Naltrexone-Bupropion HCl ER 8-90 MG TB12    Sig: Take 1 tab in the AM for 1 week. Take 2 tabs in the AM for 1 week. Take 2 in the AM a 1 PM for 1 week. Take 2 in the AM and 2 in the PM.    Dispense:  120 tablet    Refill:  1    Order Specific Question:  Supervising Provider    Answer:  Arlis Porta [phone]      Follow up plan: Return in about 5 weeks (around 03/08/2015) for weight loss. Marland Kitchen

## 2015-02-01 NOTE — Assessment & Plan Note (Signed)
Pt would like to try Contrave for weight loss. Discussed risks vs. Benefits. Encouraged diet and exercise changes.  Dosing reviewed. Pt to follow-up in 6 weeks.

## 2015-02-08 ENCOUNTER — Telehealth: Payer: Self-pay

## 2015-02-08 MED ORDER — POLYETHYLENE GLYCOL 3350 17 GM/SCOOP PO POWD: 17.0000 g | Freq: Every day | ORAL | Status: DC

## 2015-02-08 NOTE — Telephone Encounter (Signed)
Medication sent. Please let patient know it is there. Thanks!

## 2015-02-08 NOTE — Telephone Encounter (Signed)
Patient called requesting generic miralax to be sent to [facility]. Please advise patient.  She stated that she requested this and it was denied.

## 2015-02-09 ENCOUNTER — Encounter: Payer: Self-pay | Admitting: General Surgery

## 2015-02-15 ENCOUNTER — Ambulatory Visit (INDEPENDENT_AMBULATORY_CARE_PROVIDER_SITE_OTHER): Payer: Medicare Other | Admitting: General Surgery

## 2015-02-15 ENCOUNTER — Encounter: Payer: Self-pay | Admitting: General Surgery

## 2015-02-15 VITALS — BP 124/72 | HR 70 | Resp 16 | Ht 64.0 in | Wt 178.0 lb

## 2015-02-15 DIAGNOSIS — N6022 Fibroadenosis of left breast: Secondary | ICD-10-CM | POA: Diagnosis not present

## 2015-02-15 DIAGNOSIS — Z803 Family history of malignant neoplasm of breast: Secondary | ICD-10-CM | POA: Diagnosis not present

## 2015-02-15 NOTE — Patient Instructions (Signed)
Patient to return as needed. Continue self breast exams. Call office for any new breast issues or concerns.'  

## 2015-02-15 NOTE — Progress Notes (Signed)
Patient ID: Kathy Morton, female   DOB: 05/15/1945, 70 y.o.   MRN: [id number]  Chief Complaint  Patient presents with  . Follow-up    mammogram    HPI Kathy Morton is a 70 y.o. female.  who presents for a breast evaluation. The most recent mammogram was done on 02-08-15. Patient does perform regular self breast checks and gets regular mammograms done.   I have reviewed the history of present illness with the patient. HPI  Past Medical History  Diagnosis Date  . Anxiety   . Depression   . Migraine   . History of blood clots   . Incontinence     Past Surgical History  Procedure Laterality Date  . Knee surgery Left 2010    torn meniscus  . 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 Tonsilectomy, adenoidectomy, bilateral myringotomy and tubes      Family History  Problem Relation Age of Onset  . Breast cancer Maternal Grandmother 80  . Heart attack Father   . Cancer Mother     bladder    Social History Social History  Substance Use Topics  . Smoking status: Never Smoker   . Smokeless tobacco: Never Used  . Alcohol Use: No    No Known Allergies  Current Outpatient Prescriptions  Medication Sig Dispense Refill  . atorvastatin (LIPITOR) 20 MG tablet Take 1 tablet (20 mg total) by mouth daily. 90 tablet 3  . clonazePAM (KLONOPIN) 1 MG tablet Take 1 tablet (1 mg total) by mouth daily. 60 tablet 0  . lisinopril (PRINIVIL,ZESTRIL) 10 MG tablet Take 1 tablet (10 mg total) by mouth daily. 90 tablet 3  . Melatonin 3 MG CAPS Take 2 capsules (6 mg total) by mouth daily. Take 1-2 capsules at bedtime.  0  . Naltrexone-Bupropion HCl ER 8-90 MG TB12 Take 1 tab in the AM for 1 week. Take 2 tabs in the AM for 1 week. Take 2 in the AM a 1 PM for 1 week. Take 2 in the AM and 2 in the PM. 120 tablet 1  . polyethylene glycol powder (GLYCOLAX/MIRALAX) powder Take 17 g by mouth daily. 3350 g 11  . QUEtiapine (SEROQUEL) 100 MG tablet Take 1 tablet by mouth  daily at 3 pm.    . SUMAtriptan (IMITREX) 100 MG tablet Take 100 mg by mouth every 2 (two) hours as needed for migraine or headache. May repeat in 2 hours if headache persists or recurs.    . valACYclovir (VALTREX) 1000 MG tablet Take 2,000 mg by mouth every 12 (twelve) hours.  0   No current facility-administered medications for this visit.    Review of Systems Review of Systems  Constitutional: Negative.   Respiratory: Negative.   Cardiovascular: Negative.     Blood pressure 124/72, pulse 70, resp. rate 16, height 5\' 4"  (1.626 m), weight 178 lb (80.74 kg).  Physical Exam Physical Exam  Constitutional: She is oriented to person, place, and time. She appears well-developed and well-nourished.  Eyes: Conjunctivae are normal. No scleral icterus.  Neck: Neck supple.  Cardiovascular: Normal rate, regular rhythm and normal heart sounds.   Pulmonary/Chest: Effort normal and breath sounds normal. Right breast exhibits no inverted nipple, no mass, no nipple discharge and no skin change. Left breast exhibits no inverted nipple, no mass, no nipple discharge, no skin change and no tenderness.  Abdominal: Soft. Bowel sounds are normal. There is no tenderness.  Lymphadenopathy:  She has no cervical adenopathy.    She has no axillary adenopathy.  Neurological: She is alert and oriented to person, place, and time.  Skin: Skin is warm and dry.    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 Call office for any new breast issues or concerns.  PCP:  Luciana Axe This information has been scribed by Sebastian Ache.    SANKAR,SEEPLAPUTHUR G 02/15/2015, 1:51 PM

## 2015-03-03 ENCOUNTER — Ambulatory Visit (INDEPENDENT_AMBULATORY_CARE_PROVIDER_SITE_OTHER): Payer: Medicare Other | Admitting: Family Medicine

## 2015-03-03 VITALS — BP 140/72 | HR 77 | Temp 97.8°F | Resp 16 | Ht 64.0 in | Wt 174.0 lb

## 2015-03-03 DIAGNOSIS — N390 Urinary tract infection, site not specified: Secondary | ICD-10-CM

## 2015-03-03 DIAGNOSIS — R319 Hematuria, unspecified: Secondary | ICD-10-CM | POA: Diagnosis not present

## 2015-03-03 LAB — POCT URINALYSIS DIPSTICK
BILIRUBIN UA: NEGATIVE
Glucose, UA: NEGATIVE
Ketones, UA: NEGATIVE
PH UA: 5
PROTEIN UA: NEGATIVE
Spec Grav, UA: 1.015
UROBILINOGEN UA: NEGATIVE

## 2015-03-03 MED ORDER — NITROFURANTOIN MONOHYD MACRO 100 MG PO CAPS: 100.0000 mg | ORAL_CAPSULE | Freq: Two times a day (BID) | ORAL | Status: DC

## 2015-03-03 NOTE — Patient Instructions (Signed)

## 2015-03-03 NOTE — Progress Notes (Signed)
Subjective:    Patient ID: Kathy Morton, female    DOB: 1945/11/06, 70 y.o.   MRN: [id number]  HPI: Kathy Morton is a 70 y.o. female presenting on 03/03/2015 for Urinary Tract Infection   HPI  Pt presents for possible UTI. Symptoms began with burning when she voids about 2-3 weeks ago.  No pelvic or flank pain. Has been taking AZO standard to help with pain- has been controlling her symptoms. Has tried increasing fluids. Reporting no freuquency or hesitancy. No blood in urine. No foul smell. Dark yellow. No fevers, no nausea or vomiting.   Past Medical History  Diagnosis Date  . Anxiety   . Depression   . Migraine   . History of blood clots   . Incontinence     Current Outpatient Prescriptions on File Prior to Visit  Medication Sig  . atorvastatin (LIPITOR) 20 MG tablet Take 1 tablet (20 mg total) by mouth daily.  . clonazePAM (KLONOPIN) 1 MG tablet Take 1 tablet (1 mg total) by mouth daily.  Marland Kitchen lisinopril (PRINIVIL,ZESTRIL) 10 MG tablet Take 1 tablet (10 mg total) by mouth daily.  . Melatonin 3 MG CAPS Take 2 capsules (6 mg total) by mouth daily. Take 1-2 capsules at bedtime.  . Naltrexone-Bupropion HCl ER 8-90 MG TB12 Take 1 tab in the AM for 1 week. Take 2 tabs in the AM for 1 week. Take 2 in the AM a 1 PM for 1 week. Take 2 in the AM and 2 in the PM.  . polyethylene glycol powder (GLYCOLAX/MIRALAX) powder Take 17 g by mouth daily.  . QUEtiapine (SEROQUEL) 100 MG tablet Take 1 tablet by mouth daily at 3 pm.  . SUMAtriptan (IMITREX) 100 MG tablet Take 100 mg by mouth every 2 (two) hours as needed for migraine or headache. May repeat in 2 hours if headache persists or recurs.  . valACYclovir (VALTREX) 1000 MG tablet Take 2,000 mg by mouth every 12 (twelve) hours.   No current facility-administered medications on file prior to visit.    Review of Systems  Constitutional: Negative for fever and chills.  Respiratory: Negative for chest tightness, shortness of breath and  wheezing.   Cardiovascular: Negative for chest pain, palpitations and leg swelling.  Gastrointestinal: Negative for nausea, vomiting and abdominal pain.  Genitourinary: Positive for dysuria and urgency. Negative for frequency, hematuria, flank pain, vaginal bleeding, vaginal discharge, difficulty urinating and vaginal pain.   Per HPI unless specifically indicated above     Objective:    BP 140/72 mmHg  Pulse 77  Temp(Src) 97.8 F (36.6 C) (Oral)  Resp 16  Ht 5\' 4"  (1.626 m)  Wt 174 lb (78.926 kg)  BMI 29.85 kg/m2  Wt Readings from Last 3 Encounters:  03/03/15 174 lb (78.926 kg)  02/15/15 178 lb (80.74 kg)  02/01/15 183 lb (83.008 kg)    Physical Exam  Constitutional: She is oriented to person, place, and time. She appears well-developed and well-nourished. No distress.  HENT:  Head: Normocephalic and atraumatic.  Cardiovascular: Normal rate and regular rhythm.  Exam reveals no gallop and no friction rub.   No murmur heard. Pulmonary/Chest: Effort normal and breath sounds normal. No respiratory distress.  Abdominal: Soft. Normal appearance. There is no splenomegaly or hepatomegaly. There is no tenderness. There is no CVA tenderness.  Neurological: She is alert and oriented to person, place, and time. No cranial nerve deficit. Coordination normal.  Skin: She is not diaphoretic.  Psychiatric: Her behavior is  normal.   Results for orders placed or performed in visit on 03/03/15  POCT urinalysis dipstick  Result Value Ref Range   Color, UA dark yellow    Clarity, UA cloudy    Glucose, UA negative    Bilirubin, UA negative    Ketones, UA negative    Spec Grav, UA 1.015    Blood, UA trace    pH, UA 5.0    Protein, UA negative    Urobilinogen, UA negative    Nitrite, UA large    Leukocytes, UA large (3+) (A) Negative      Assessment & Plan:   Problem List Items Addressed This Visit    None    Visit Diagnoses    Urinary tract infection with hematuria, site unspecified     -  Primary    Treat empirically for UTI given nitrites, leuks, and blood. UC pending. Macrobid given- CrCl 85.83 ml/min per Cockcroft Gault equation. Chose to avoid cipro in older adult. Alarm symptoms reviewed. Increase fluids. Continue AZO at home. Return if not improving.     Relevant Medications    nitrofurantoin, macrocrystal-monohydrate, (MACROBID) 100 MG capsule    Other Relevant Orders    POCT urinalysis dipstick (Completed)    Urine Culture       Meds ordered this encounter  Medications  . nitrofurantoin, macrocrystal-monohydrate, (MACROBID) 100 MG capsule    Sig: Take 1 capsule (100 mg total) by mouth 2 (two) times daily.    Dispense:  14 capsule    Refill:  0    Order Specific Question:  Supervising Provider    Answer:  Arlis Porta [id number]      Follow up plan: Return if symptoms worsen or fail to improve.

## 2015-03-06 LAB — URINE CULTURE

## 2015-03-11 LAB — BASIC METABOLIC PANEL
BUN/Creatinine Ratio: 16 (ref 11–26)
BUN: 11 mg/dL (ref 8–27)
CHLORIDE: 96 mmol/L (ref 96–106)
CO2: 19 mmol/L (ref 18–29)
CREATININE: 0.69 mg/dL (ref 0.57–1.00)
Calcium: 9.3 mg/dL (ref 8.7–10.3)
GFR calc Af Amer: 102 mL/min/{1.73_m2} (ref >59)
GFR calc non Af Amer: 88 mL/min/{1.73_m2} (ref >59)
GLUCOSE: 101 mg/dL — AB (ref 65–99)
POTASSIUM: 4.6 mmol/L (ref 3.5–5.2)
SODIUM: 135 mmol/L (ref 134–144)

## 2015-03-15 ENCOUNTER — Encounter: Payer: Self-pay | Admitting: Family Medicine

## 2015-03-15 ENCOUNTER — Ambulatory Visit (INDEPENDENT_AMBULATORY_CARE_PROVIDER_SITE_OTHER): Payer: Medicare Other | Admitting: Family Medicine

## 2015-03-15 VITALS — BP 128/82 | HR 88 | Temp 98.4°F | Resp 16 | Ht 64.0 in | Wt 169.0 lb

## 2015-03-15 DIAGNOSIS — G43709 Chronic migraine without aura, not intractable, without status migrainosus: Secondary | ICD-10-CM | POA: Diagnosis not present

## 2015-03-15 DIAGNOSIS — E669 Obesity, unspecified: Secondary | ICD-10-CM

## 2015-03-15 DIAGNOSIS — N3001 Acute cystitis with hematuria: Secondary | ICD-10-CM

## 2015-03-15 MED ORDER — SUMATRIPTAN SUCCINATE 100 MG PO TABS: ORAL_TABLET | ORAL | Status: DC

## 2015-03-15 NOTE — Assessment & Plan Note (Signed)
Refill imitrex. Pt is using about 3 per month. Plan to start prophylaxis if use increases.

## 2015-03-15 NOTE — Patient Instructions (Signed)
We will plan on following-up in 6 weeks on the contrave to determine how long we want to continue. We will also do a follow-up urine culture that day.   For voiding: Try to ensure you void every 3-4 hours to prevent a UTI. Drink plenty of fluids.

## 2015-03-15 NOTE — Assessment & Plan Note (Signed)
14 lb weight loss on contrave so far. She feels well and is doing well on medication. Encouraged pt to void every 3-4 hours due to feeling she voids less on Contrave.  Plan to continue for 6 weeks and follow-up at 12 week mark. D/C if weight loss of less than 5% body weight.

## 2015-03-15 NOTE — Progress Notes (Signed)
Subjective:    Patient ID: Kathy Morton, female    DOB: Oct 26, 1945, 70 y.o.   MRN: [id number]  HPI: Kathy Morton is a 70 y.o. female presenting on 03/15/2015 for Weight Check   HPI  Pt presents for follow-up weight loss. Has started contrave on 02/01/15.  Loss of 14lbs since starting medications. Doing well on Contrave- not eating as much. Some side effects- doesn't feel she voids as often. No blurred vision. No SI. Trying to eat 3 healthy meals and walking for exercise. Would like to continue for a full 12 weeks despite cost of medication.   Migraines: Requeting refill on imitrex. Last migraine on Saturday. Migraines- Using about 3 per month. Sometimes has to do 2 hours. No aura with migraines. Less severe. L sided pulsing pain. Nausea and photophobia.   Finishing Keflex for UTI. Will need follow-up UA/UC after therapy. Symptoms have resolved.  Mom has history of bladder cancer.   Past Medical History  Diagnosis Date  . Anxiety   . Depression   . Migraine   . History of blood clots   . Incontinence     Current Outpatient Prescriptions on File Prior to Visit  Medication Sig  . atorvastatin (LIPITOR) 20 MG tablet Take 1 tablet (20 mg total) by mouth daily.  . clonazePAM (KLONOPIN) 1 MG tablet Take 1 tablet (1 mg total) by mouth daily.  Marland Kitchen lisinopril (PRINIVIL,ZESTRIL) 10 MG tablet Take 1 tablet (10 mg total) by mouth daily.  . Melatonin 3 MG CAPS Take 2 capsules (6 mg total) by mouth daily. Take 1-2 capsules at bedtime.  . Naltrexone-Bupropion HCl ER 8-90 MG TB12 Take 1 tab in the AM for 1 week. Take 2 tabs in the AM for 1 week. Take 2 in the AM a 1 PM for 1 week. Take 2 in the AM and 2 in the PM.  . polyethylene glycol powder (GLYCOLAX/MIRALAX) powder Take 17 g by mouth daily.  . QUEtiapine (SEROQUEL) 100 MG tablet Take 1 tablet by mouth daily at 3 pm.  . valACYclovir (VALTREX) 1000 MG tablet Take 2,000 mg by mouth every 12 (twelve) hours.   No current facility-administered  medications on file prior to visit.    Review of Systems  Constitutional: Positive for activity change and appetite change. Negative for fever, chills and unexpected weight change.  HENT: Negative.   Respiratory: Negative for cough, chest tightness and wheezing.   Cardiovascular: Negative for chest pain and leg swelling.  Gastrointestinal: Negative for nausea, vomiting, abdominal pain, diarrhea and constipation.  Endocrine: Negative.  Negative for cold intolerance, heat intolerance, polydipsia, polyphagia and polyuria.  Genitourinary: Positive for decreased urine volume. Negative for dysuria, frequency, hematuria, difficulty urinating and pelvic pain.       Feels she voids less on contrave   Musculoskeletal: Negative.   Neurological: Negative for dizziness, light-headedness and numbness.  Psychiatric/Behavioral: Negative.    Per HPI unless specifically indicated above     Objective:    BP 128/82 mmHg  Pulse 88  Temp(Src) 98.4 F (36.9 C) (Oral)  Resp 16  Ht 5\' 4"  (1.626 m)  Wt 169 lb (76.658 kg)  BMI 28.99 kg/m2  Wt Readings from Last 3 Encounters:  03/15/15 169 lb (76.658 kg)  03/03/15 174 lb (78.926 kg)  02/15/15 178 lb (80.74 kg)    Physical Exam  Constitutional: She is oriented to person, place, and time. She appears well-developed and well-nourished.  HENT:  Head: Normocephalic and atraumatic.  Neck: Neck  supple.  Cardiovascular: Normal rate, regular rhythm and normal heart sounds.  Exam reveals no gallop and no friction rub.   No murmur heard. Pulmonary/Chest: Effort normal and breath sounds normal. She has no wheezes. She exhibits no tenderness.  Abdominal: Soft. Normal appearance and bowel sounds are normal. She exhibits no distension and no mass. There is no tenderness. There is no rebound and no guarding.  Musculoskeletal: Normal range of motion. She exhibits no edema or tenderness.  Lymphadenopathy:    She has no cervical adenopathy.  Neurological: She is  alert and oriented to person, place, and time.  Skin: Skin is warm and dry.   Results for orders placed or performed in visit on 03/03/15  Urine Culture  Result Value Ref Range   Urine Culture, Routine Final report (A)    Urine Culture result 1 Klebsiella pneumoniae (A)    ANTIMICROBIAL SUSCEPTIBILITY Comment   POCT urinalysis dipstick  Result Value Ref Range   Color, UA dark yellow    Clarity, UA cloudy    Glucose, UA negative    Bilirubin, UA negative    Ketones, UA negative    Spec Grav, UA 1.015    Blood, UA trace    pH, UA 5.0    Protein, UA negative    Urobilinogen, UA negative    Nitrite, UA large    Leukocytes, UA large (3+) (A) Negative      Assessment & Plan:   Problem List Items Addressed This Visit      Cardiovascular and Mediastinum   Migraines    Refill imitrex. Pt is using about 3 per month. Plan to start prophylaxis if use increases.       Relevant Medications   SUMAtriptan (IMITREX) 100 MG tablet     Other   Obesity - Primary    14 lb weight loss on contrave so far. She feels well and is doing well on medication. Encouraged pt to void every 3-4 hours due to feeling she voids less on Contrave.  Plan to continue for 6 weeks and follow-up at 12 week mark. D/C if weight loss of less than 5% body weight.        Other Visit Diagnoses    Acute cystitis with hematuria        Symptoms have resolved. Plan for follow-up UA to ensure hematuria resolved. Given Mom's symptoms of bladder cancer.        Meds ordered this encounter  Medications  . cephALEXin (KEFLEX) 500 MG capsule    Sig:   . SUMAtriptan (IMITREX) 100 MG tablet    Sig: Take 1/2 tablet at the onset of the headache. May repeat in 2 hours if need to resolve symptoms.    Dispense:  9 tablet    Refill:  11    Order Specific Question:  Supervising Provider    Answer:  Arlis Porta [phone]      Follow up plan: Return in about 6 weeks (around 04/26/2015) for Weight loss. Marland Kitchen

## 2015-04-06 ENCOUNTER — Other Ambulatory Visit: Payer: Self-pay | Admitting: Family Medicine

## 2015-04-12 ENCOUNTER — Other Ambulatory Visit: Payer: Self-pay | Admitting: Family Medicine

## 2015-04-21 ENCOUNTER — Telehealth: Payer: Self-pay | Admitting: Family Medicine

## 2015-04-21 NOTE — Telephone Encounter (Signed)
Patient made aware that this would be refilled on 04/26/15.

## 2015-04-21 NOTE — Telephone Encounter (Signed)
Pt needs a refill on klonopin sent to [facility].  Dosage should be 2 tablets daily.  Last time if was written for 1 daily.  Her call back number is [phone]

## 2015-04-21 NOTE — Telephone Encounter (Signed)
Please inform the patient we cannot electronically send refills for controlled medications and I typically do not fill them without an appt.She has an appt already on 4/24- I can refill it then. Thanks! AK

## 2015-04-26 ENCOUNTER — Ambulatory Visit (INDEPENDENT_AMBULATORY_CARE_PROVIDER_SITE_OTHER): Payer: Medicare Other | Admitting: Family Medicine

## 2015-04-26 ENCOUNTER — Encounter: Payer: Self-pay | Admitting: Family Medicine

## 2015-04-26 VITALS — BP 140/68 | HR 80 | Temp 98.8°F | Resp 16 | Ht 64.0 in | Wt 170.0 lb

## 2015-04-26 DIAGNOSIS — G4700 Insomnia, unspecified: Secondary | ICD-10-CM

## 2015-04-26 DIAGNOSIS — F4310 Post-traumatic stress disorder, unspecified: Secondary | ICD-10-CM

## 2015-04-26 DIAGNOSIS — N390 Urinary tract infection, site not specified: Secondary | ICD-10-CM

## 2015-04-26 DIAGNOSIS — E669 Obesity, unspecified: Secondary | ICD-10-CM | POA: Diagnosis not present

## 2015-04-26 LAB — POCT URINALYSIS DIPSTICK
BILIRUBIN UA: NEGATIVE
GLUCOSE UA: NEGATIVE
KETONES UA: NEGATIVE
PH UA: 5
Protein, UA: NEGATIVE
Spec Grav, UA: 1.015
Urobilinogen, UA: NEGATIVE

## 2015-04-26 MED ORDER — CLONAZEPAM 1 MG PO TABS: 1.0000 mg | ORAL_TABLET | Freq: Two times a day (BID) | ORAL | Status: DC | PRN

## 2015-04-26 MED ORDER — QUETIAPINE FUMARATE 100 MG PO TABS: 100.0000 mg | ORAL_TABLET | Freq: Every day | ORAL | Status: DC

## 2015-04-26 NOTE — Assessment & Plan Note (Signed)
Great effect with contrave but due to plateau, will taper off today. Pt instructed on how to taper off.  Encouraged continued diet changes and starting a formal exercise program.

## 2015-04-26 NOTE — Progress Notes (Signed)
Subjective:    Patient ID: Kathy Morton, female    DOB: [birth year], 70 y.o.   MRN: [id number]  HPI: Kathy Morton is a 70 y.o. female presenting on 04/26/2015 for Obesity   HPI  Pt presents for recheck of obesity. She has completed 3 mos of contrave. Doing well. Weight has plateaued. Starting weight is 183 down to 170. Drinking mainly water but still drinks diet sodas occasionally.   Urine- follow-up UA today from her previous UTI. Occasional burning but thought to be related to diet sodas. Noticed a foul odor to urine since starting contrave. Urine feels more concentrated. No low back pain or fever. Has history of chronic UTIs when she was younger.  Pt also requesting a refill on her PRN clonazepam. She takes for history of PTSD. Takes occasional. Last fill was 02/02/2015 of 60 tablets. Some days she takes 2 and some days she take 0. Feels anxious as times. No SI or HI.   Past Medical History  Diagnosis Date  . Anxiety   . Depression   . Migraine   . History of blood clots   . Incontinence     Current Outpatient Prescriptions on File Prior to Visit  Medication Sig  . atorvastatin (LIPITOR) 20 MG tablet Take 1 tablet (20 mg total) by mouth daily.  . cephALEXin (KEFLEX) 500 MG capsule   . CONTRAVE 8-90 MG TB12 TAKE 1TAB IN AM X7DAYS,2TABS IN AM X7DAYS,2TABS IN AM &1TAB AT 1PM X7DAYS,2 TABS IN AM AND 2TABS PM  . lisinopril (PRINIVIL,ZESTRIL) 10 MG tablet Take 1 tablet (10 mg total) by mouth daily.  . Melatonin 3 MG CAPS Take 2 capsules (6 mg total) by mouth daily. Take 1-2 capsules at bedtime.  . polyethylene glycol powder (GLYCOLAX/MIRALAX) powder Take 17 g by mouth daily.  . SUMAtriptan (IMITREX) 100 MG tablet Take 1/2 tablet at the onset of the headache. May repeat in 2 hours if need to resolve symptoms.  . valACYclovir (VALTREX) 1000 MG tablet Take 2,000 mg by mouth every 12 (twelve) hours.   No current facility-administered medications on file prior to visit.     Review of Systems  Constitutional: Negative for fever and chills.  HENT: Negative.   Respiratory: Negative for cough, chest tightness and wheezing.   Cardiovascular: Negative for chest pain and leg swelling.  Gastrointestinal: Negative for nausea, vomiting, abdominal pain, diarrhea and constipation.  Endocrine: Negative.  Negative for cold intolerance, heat intolerance, polydipsia, polyphagia and polyuria.  Genitourinary: Positive for dysuria. Negative for urgency, hematuria, decreased urine volume, vaginal discharge, difficulty urinating and pelvic pain.  Musculoskeletal: Negative.   Neurological: Negative for dizziness, light-headedness and numbness.  Psychiatric/Behavioral: Negative for suicidal ideas and sleep disturbance. The patient is nervous/anxious.    Per HPI unless specifically indicated above     Objective:    BP 140/68 mmHg  Pulse 80  Temp(Src) 98.8 F (37.1 C) (Oral)  Resp 16  Ht 5\' 4"  (1.626 m)  Wt 170 lb (77.111 kg)  BMI 29.17 kg/m2  Wt Readings from Last 3 Encounters:  04/26/15 170 lb (77.111 kg)  03/15/15 169 lb (76.658 kg)  03/03/15 174 lb (78.926 kg)    GAD 7 : Generalized Anxiety Score 04/26/2015  Nervous, Anxious, on Edge 2  Control/stop worrying 2  Worry too much - different things 0  Trouble relaxing 1  Restless 0  Easily annoyed or irritable 2  Afraid - awful might happen 0  Total GAD 7 Score 7  Anxiety  Difficulty Somewhat difficult    Physical Exam  Constitutional: She is oriented to person, place, and time. She appears well-developed and well-nourished.  HENT:  Head: Normocephalic and atraumatic.  Neck: Neck supple.  Cardiovascular: Normal rate, regular rhythm and normal heart sounds.  Exam reveals no gallop and no friction rub.   No murmur heard. Pulmonary/Chest: Effort normal and breath sounds normal. She has no wheezes. She exhibits no tenderness.  Abdominal: Soft. Normal appearance and bowel sounds are normal. She exhibits no  distension and no mass. There is no tenderness. There is no rebound, no guarding and no CVA tenderness.  Musculoskeletal: Normal range of motion. She exhibits no edema or tenderness.  Lymphadenopathy:    She has no cervical adenopathy.  Neurological: She is alert and oriented to person, place, and time.  Skin: Skin is warm and dry.  Psychiatric: She has a normal mood and affect. Her behavior is normal. Judgment and thought content normal.   Results for orders placed or performed in visit on 04/26/15  POCT Urinalysis Dipstick  Result Value Ref Range   Color, UA yellow    Clarity, UA clear    Glucose, UA negative    Bilirubin, UA negative    Ketones, UA negative    Spec Grav, UA 1.015    Blood, UA trace    pH, UA 5.0    Protein, UA negative    Urobilinogen, UA negative    Nitrite, UA large    Leukocytes, UA large (3+) (A) Negative      Assessment & Plan:   Problem List Items Addressed This Visit      Other   PTSD (post-traumatic stress disorder)    Renewed PRN clonazepam. Pt not willing to take daily medication for anxiety or depression at this point. Have discussed the need if symptoms become more frequent.       Relevant Medications   clonazePAM (KLONOPIN) 1 MG tablet   Obesity    Great effect with contrave but due to plateau, will taper off today. Pt instructed on how to taper off.  Encouraged continued diet changes and starting a formal exercise program.       Insomnia   Relevant Medications   QUEtiapine (SEROQUEL) 100 MG tablet    Other Visit Diagnoses    Chronic UTI    -  Primary    Still leukocytes and trace blood. Culture today. Trial of probiotic to help urine. Reduce coffee and tea. Consider Urology consult. Not treating today due to no overwhelming signs of infection.     Relevant Orders    POCT Urinalysis Dipstick (Completed)    CULTURE, URINE COMPREHENSIVE       Meds ordered this encounter  Medications  . QUEtiapine (SEROQUEL) 100 MG tablet    Sig:  Take 1 tablet (100 mg total) by mouth at bedtime.    Dispense:  90 tablet    Refill:  3    Order Specific Question:  Supervising Provider    Answer:  Arlis Porta [phone]  . clonazePAM (KLONOPIN) 1 MG tablet    Sig: Take 1 tablet (1 mg total) by mouth 2 (two) times daily as needed for anxiety.    Dispense:  60 tablet    Refill:  1    Order Specific Question:  Supervising Provider    Answer:  Arlis Porta [id number]      Follow up plan: Return in about 3 months (around 07/26/2015) for Hypertension. Marland Kitchen

## 2015-04-26 NOTE — Assessment & Plan Note (Signed)
Renewed PRN clonazepam. Pt not willing to take daily medication for anxiety or depression at this point. Have discussed the need if symptoms become more frequent.

## 2015-04-26 NOTE — Patient Instructions (Signed)
Great job on your [employer] loss!  I think we can stop taking the contrave at this point. To taper off:   Week 1: 2 AM and 1 PM Week 2: 1 AM and 1 PM Week 3: 1 AM and  0PM Week 4: STOP.  Urine- we will culture your urine to see if anything grows. Try adding a probiotic to see if that helps. Drink plenty of water.  Please call if you have symptoms.

## 2015-04-29 LAB — CULTURE, URINE COMPREHENSIVE

## 2015-05-10 ENCOUNTER — Telehealth: Payer: Self-pay | Admitting: Family Medicine

## 2015-05-10 DIAGNOSIS — N390 Urinary tract infection, site not specified: Secondary | ICD-10-CM

## 2015-05-10 NOTE — Telephone Encounter (Signed)
Patient states on Friday she had frequency and burning. Due to mothers bladder cancer she prefers to just go to urologist. Patient prefers to be seen in [place] though.

## 2015-05-10 NOTE — Telephone Encounter (Signed)
Pt  Called states that she need a referral to  Urological pt call back # is [phone]

## 2015-05-10 NOTE — Telephone Encounter (Signed)
Will do. Referral will be placed for [facility].

## 2015-05-10 NOTE — Telephone Encounter (Signed)
Will someone please call and discuss with patient. Had been mentioned at least visit with recurrent UTI symptoms. At last phone call her symptoms had resolved. Has anything changed? Our current plan was to recheck urine at next visit- and if she had persistent blood outside of infection to send to urology. I am happy to send her now if she prefer's but I just want to make sure nothing has changed. Thanks! AK

## 2015-05-18 ENCOUNTER — Ambulatory Visit (INDEPENDENT_AMBULATORY_CARE_PROVIDER_SITE_OTHER): Payer: Medicare Other | Admitting: Urology

## 2015-05-18 ENCOUNTER — Encounter: Payer: Self-pay | Admitting: Urology

## 2015-05-18 VITALS — BP 146/83 | HR 96 | Ht 64.0 in | Wt 176.5 lb

## 2015-05-18 DIAGNOSIS — R300 Dysuria: Secondary | ICD-10-CM

## 2015-05-18 DIAGNOSIS — N390 Urinary tract infection, site not specified: Secondary | ICD-10-CM | POA: Diagnosis not present

## 2015-05-18 DIAGNOSIS — N952 Postmenopausal atrophic vaginitis: Secondary | ICD-10-CM

## 2015-05-18 LAB — URINALYSIS, COMPLETE
BILIRUBIN UA: NEGATIVE
GLUCOSE, UA: NEGATIVE
KETONES UA: NEGATIVE
NITRITE UA: NEGATIVE
Protein, UA: NEGATIVE
RBC, UA: NEGATIVE
UUROB: 0.2 mg/dL (ref 0.2–1.0)
pH, UA: 6 (ref 5.0–7.5)

## 2015-05-18 LAB — BLADDER SCAN AMB NON-IMAGING: SCAN RESULT: 37

## 2015-05-18 LAB — MICROSCOPIC EXAMINATION
Epithelial Cells (non renal): NONE SEEN /hpf (ref 0–10)
RBC, UA: NONE SEEN /hpf (ref 0–?)

## 2015-05-18 NOTE — Progress Notes (Signed)
05/18/2015 10:16 AM   Kathy Morton [birth year] [id number]  Referring provider: Luciana Axe, NP [name], [name] [address]  Chief Complaint  Patient presents with  . Recurrent UTI    referred by Kathy Sorrow MD    HPI: Patient is a 70 year old Caucasian female with persistent dysuria who presents today as a referral from her primary care provider, Kathy Overton Mam, NP, for further evaluation and management.  Patient states that for the last 3 months she has had persistent intermittent dysuria. She states it does not occur with every void, but when it does occur, she has a sensation in her bladder and she knows when she goes to void it is going to be very uncomfortable.  She did have a positive urinary tract infection on 04/26/2015 with Klebsiella pneumonia.  She was treated with an antibiotic, but the symptoms did not resolve.  She has a remote history of gross hematuria with a urinary tract infection, but she does not endorse any recent gross hematuria.  She states that she does have a remote history of recurrent urinary tract infections, but this is when she was younger and sexually active.  She is taking AZO to relieve the dysuria.  Her UA today is unremarkable. Her PVR was 37 mL.  Her baseline urinary symptoms consist of frequency, urgency, nocturia 1 and incontinence.  She is having both stress and urinary incontinence.  She does not routinely wear incontinence pads, but she will wear a pad and she knows she will be away from a bathroom for a long period of time.  She does not have a personal history of kidney stone disease.  She is not close with her family, so she is unsure if there is a family history of kidney stone disease. Her mother did pass away with bladder cancer and her mother had similar symptoms.  She is not a smoker, but she has been exposed to secondhand smoke in the past for 22 years. 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   PMH: Past Medical History  Diagnosis Date  . Anxiety   . Depression   . Migraine   . History of blood clots   . Incontinence   . Recurrent UTI   . DVT of leg (deep venous thrombosis) [facility])     Surgical History: Past Surgical History  Procedure Laterality Date  . Knee surgery Left 2010    torn meniscus  . 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 Tonsilectomy, adenoidectomy, bilateral myringotomy and tubes      Home Medications:    Medication List       This list is accurate as of: 05/18/15 10:16 AM.  Always use your most recent med list.               atorvastatin 20 MG tablet  Commonly known as:  LIPITOR  Take 1 tablet (20 mg total) by mouth daily.     cephALEXin 500 MG capsule  Commonly known as:  KEFLEX  Reported on 05/18/2015     clonazePAM 1 MG tablet  Commonly known as:  KLONOPIN  Take 1 tablet (1 mg total) by mouth 2 (two) times daily as needed for anxiety.     CONTRAVE 8-90 MG Tb12  Generic drug:  Naltrexone-Bupropion HCl ER  TAKE 1TAB IN AM X7DAYS,2TABS IN AM X7DAYS,2TABS IN AM &1TAB AT 1PM X7DAYS,2 TABS IN AM  AND 2TABS PM     lisinopril 10 MG tablet  Commonly known as:  PRINIVIL,ZESTRIL  Take 1 tablet (10 mg total) by mouth daily.     Melatonin 3 MG Caps  Take 2 capsules (6 mg total) by mouth daily. Take 1-2 capsules at bedtime.     polyethylene glycol powder powder  Commonly known as:  GLYCOLAX/MIRALAX  Take 17 g by mouth daily.     QUEtiapine 100 MG tablet  Commonly known as:  SEROQUEL  Take 1 tablet (100 mg total) by mouth at bedtime.     SUMAtriptan 100 MG tablet  Commonly known as:  IMITREX  Take 1/2 tablet at the onset of the headache. May repeat in 2 hours if need to resolve symptoms.     valACYclovir 1000 MG tablet  Commonly known as:  VALTREX  Take 2,000 mg by mouth every 12 (twelve) hours.        Allergies: No Known Allergies  Family History: Family History  Problem Relation  Age of Onset  . Breast cancer Maternal Grandmother 80  . Heart attack Father   . Cancer Mother     bladder  . Kidney disease Neg Hx     Social History:  reports that she has never smoked. She has never used smokeless tobacco. She reports that she does not drink alcohol or use illicit drugs.  ROS: UROLOGY Frequent Urination?: Yes Hard to postpone urination?: Yes Burning/pain with urination?: Yes Get up at night to urinate?: Yes Leakage of urine?: Yes Urine stream starts and stops?: No Trouble starting stream?: No Do you have to strain to urinate?: No Blood in urine?: No Urinary tract infection?: Yes Sexually transmitted disease?: No Injury to kidneys or bladder?: No Painful intercourse?: Yes Weak stream?: No Currently pregnant?: No Vaginal bleeding?: No Last menstrual period?: n  Gastrointestinal Nausea?: No Vomiting?: No Indigestion/heartburn?: No Diarrhea?: No Constipation?: Yes  Constitutional Fever: No Night sweats?: No Weight loss?: No Fatigue?: Yes  Skin Skin rash/lesions?: No Itching?: No  Eyes Blurred vision?: No Double vision?: No  Ears/Nose/Throat Sore throat?: No Sinus problems?: Yes  Hematologic/Lymphatic Swollen glands?: No Easy bruising?: Yes  Cardiovascular Leg swelling?: No Chest pain?: No  Respiratory Cough?: No Shortness of breath?: No  Endocrine Excessive thirst?: No  Musculoskeletal Back pain?: No Joint pain?: Yes  Neurological Headaches?: Yes Dizziness?: No  Psychologic Depression?: No Anxiety?: Yes  Physical Exam: BP 146/83 mmHg  Pulse 96  Ht 5\' 4"  (1.626 m)  Wt 176 lb 8 oz (80.06 kg)  BMI 30.28 kg/m2  Constitutional: Well nourished. Alert and oriented, No acute distress. HEENT: [place] AT, moist mucus membranes. Trachea midline, no masses. Cardiovascular: No clubbing, cyanosis, or edema. Respiratory: Normal respiratory effort, no increased work of breathing. GI: Abdomen is soft, non tender, non distended,  no abdominal masses. Liver and spleen not palpable.  No hernias appreciated.  Stool sample for occult testing is not indicated.   GU: No CVA tenderness.  No bladder fullness or masses.  Atrophic external genitalia, normal pubic hair distribution, no lesions.  Normal urethral meatus, no lesions, no prolapse, no discharge.   Urethral caruncle is noted.   No bladder fullness, tenderness or masses. Normal vagina mucosa, good estrogen effect, no discharge, no lesions, good pelvic support, Grade II cystocele is noted.  Rectocele is noted.  No cervical motion tenderness.  Uterus is freely mobile and non-fixed.  No adnexal/parametria masses or tenderness noted.  Anus and perineum are without rashes or lesions.  Skin: No rashes, bruises or suspicious lesions. Lymph: No cervical or inguinal adenopathy. Neurologic: Grossly intact, no focal deficits, moving all 4 extremities. Psychiatric: Normal mood and affect.  Laboratory Data:  Lab Results  Component Value Date   CREATININE 0.69 03/10/2015     Lab Results  Component Value Date   TSH 2.160 11/11/2014       Component Value Date/Time   CHOL 242* 11/11/2014 1624   HDL 54 11/11/2014 1624   CHOLHDL 4.5* 11/11/2014 1624   LDLCALC 145* 11/11/2014 1624    Lab Results  Component Value Date   AST 19 11/11/2014   Lab Results  Component Value Date   ALT 14 11/11/2014   Urinalysis Results for orders placed or performed in visit on 05/18/15  Microscopic Examination  Result Value Ref Range   WBC, UA 0-5 0 -  5 /hpf   RBC, UA None seen 0 -  2 /hpf   Epithelial Cells (non renal) None seen 0 - 10 /hpf   Bacteria, UA Few (A) None seen/Few   Yeast, UA Present (A) None seen  Urinalysis, Complete  Result Value Ref Range   Specific Gravity, UA <1.005 (L) 1.005 - 1.030   pH, UA 6.0 5.0 - 7.5   Color, UA Yellow Yellow   Appearance Ur Clear Clear   Leukocytes, UA 1+ (A) Negative   Protein, UA Negative Negative/Trace   Glucose, UA Negative Negative    Ketones, UA Negative Negative   RBC, UA Negative Negative   Bilirubin, UA Negative Negative   Urobilinogen, Ur 0.2 0.2 - 1.0 mg/dL   Nitrite, UA Negative Negative   Microscopic Examination See below:   BLADDER SCAN AMB NON-IMAGING  Result Value Ref Range   Scan Result 37     Pertinent Imaging: Results for ALYONNA, KOPPER (MRN [id number]) as of 05/18/2015 10:05  Ref. Range 05/18/2015 09:39  Scan Result Unknown 37    Assessment & Plan:   1. Dysuria:   Patient with persistent intermittent dysuria.  The symptoms may be the result of a stone trying to pass or a bladder stone.  We will obtain a CT scan of the abdomen and pelvis without contrast to evaluate for urological stones.  - Urinalysis, Complete - BLADDER SCAN AMB NON-IMAGING  2. Vaginal atrophy:   Patient is not experiencing vaginal burning or itching at this time.  She also does not have a history of recurrent UTI's.  We will hold on prescribing vaginal estrogen cream at this time.  We will continue to monitor.    Return for CT scan report.  These notes generated with voice recognition software. I apologize for typographical errors.  Kathy Morton, [facility] [address] [place],  [address] [phone]

## 2015-05-22 DIAGNOSIS — N952 Postmenopausal atrophic vaginitis: Secondary | ICD-10-CM | POA: Insufficient documentation

## 2015-05-26 ENCOUNTER — Ambulatory Visit
Admission: RE | Admit: 2015-05-26 | Discharge: 2015-05-26 | Disposition: A | Discharge status: Home / Self Care | Payer: Medicare Other | Source: Ambulatory Visit | Attending: Urology | Admitting: Urology

## 2015-05-26 DIAGNOSIS — M5136 Other intervertebral disc degeneration, lumbar region: Secondary | ICD-10-CM | POA: Insufficient documentation

## 2015-05-26 DIAGNOSIS — R300 Dysuria: Secondary | ICD-10-CM | POA: Insufficient documentation

## 2015-05-26 DIAGNOSIS — R911 Solitary pulmonary nodule: Secondary | ICD-10-CM | POA: Insufficient documentation

## 2015-06-07 ENCOUNTER — Ambulatory Visit (INDEPENDENT_AMBULATORY_CARE_PROVIDER_SITE_OTHER): Payer: Medicare Other | Admitting: Urology

## 2015-06-07 ENCOUNTER — Encounter: Payer: Self-pay | Admitting: Urology

## 2015-06-07 VITALS — BP 153/74 | HR 90 | Ht 64.0 in | Wt 176.0 lb

## 2015-06-07 DIAGNOSIS — N952 Postmenopausal atrophic vaginitis: Secondary | ICD-10-CM

## 2015-06-07 DIAGNOSIS — R300 Dysuria: Secondary | ICD-10-CM | POA: Diagnosis not present

## 2015-06-07 NOTE — Progress Notes (Signed)
10:40 AM   Kathy Morton 15-Oct-1945 [id number]  Referring provider: Luciana Axe, NP [place], [place] [address]  Chief Complaint  Patient presents with  . Results    CT scan    HPI: Patient is a 70 year old Caucasian female who presents today to discuss the results of her CT scan.    Background history Patient with persistent dysuria who presented to [place] a referral from her primary care provider, Amy Overton Mam, NP, for further evaluation and management.  Patient states that for the last 3 months she has had persistent intermittent dysuria. She states it does not occur with every void, but when it does occur, she has a sensation in her bladder and she knows when she goes to void it is going to be very uncomfortable.  She did have a positive urinary tract infection on 04/26/2015 with Klebsiella pneumonia.  She was treated with an antibiotic, but the symptoms did not resolve.  She has a remote history of gross hematuria with a urinary tract infection, but she does not endorse any recent gross hematuria.  She states that she does have a remote history of recurrent urinary tract infections, but this is when she was younger and sexually active.  She is taking AZO to relieve the dysuria.  Her UA today was unremarkable. Her PVR was 37 mL.  Her baseline urinary symptoms consist of frequency, urgency, nocturia 1 and incontinence.  She is having both stress and urinary incontinence.  She does not routinely wear incontinence pads, but she will wear a pad and she knows she will be away from a bathroom for a long period of time.  She does not have a personal history of kidney stone disease.  She is not close with her family, so she is unsure if there is a family history of kidney stone disease. Her mother did pass away with bladder cancer and her mother had similar symptoms.  She is not a smoker, but she has been exposed to secondhand smoke in the past for 22 years. She did work at a  [employer] and was exposed to chemicals.  Today, patient is not experiencing dysuria.  She states that the dysuria abated prior to the CT scan.  Her UA is unremarkable.   CT abd/pelvis w/o performed on 05/26/2015 noted no acute findings within the abdomen or pelvis.  I have personally reviewed the films.    PMH: Past Medical History  Diagnosis Date  . Anxiety   . Depression   . Migraine   . History of blood clots   . Incontinence   . Recurrent UTI   . DVT of leg (deep venous thrombosis) ([facility])   . Dysuria     Surgical History: Past Surgical History  Procedure Laterality Date  . Knee surgery Left 2010    torn meniscus  . 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 Tonsilectomy, adenoidectomy, bilateral myringotomy and tubes      Home Medications:    Medication List       This list is accurate as of: 06/07/15 10:40 AM.  Always use your most recent med list.               atorvastatin 20 MG tablet  Commonly known as:  LIPITOR  Take 1 tablet (20 mg total) by mouth daily.     cephALEXin 500 MG capsule  Commonly known as:  KEFLEX  Reported on 06/07/2015     clonazePAM 1 MG tablet  Commonly known as:  KLONOPIN  Take 1 tablet (1 mg total) by mouth 2 (two) times daily as needed for anxiety.     CONTRAVE 8-90 MG Tb12  Generic drug:  Naltrexone-Bupropion HCl ER  TAKE 1TAB IN AM X7DAYS,2TABS IN AM X7DAYS,2TABS IN AM &1TAB AT 1PM X7DAYS,2 TABS IN AM AND 2TABS PM     lisinopril 10 MG tablet  Commonly known as:  PRINIVIL,ZESTRIL  Take 1 tablet (10 mg total) by mouth daily.     Melatonin 3 MG Caps  Take 2 capsules (6 mg total) by mouth daily. Take 1-2 capsules at bedtime.     polyethylene glycol powder powder  Commonly known as:  GLYCOLAX/MIRALAX  Take 17 g by mouth daily.     QUEtiapine 100 MG tablet  Commonly known as:  SEROQUEL  Take 1 tablet (100 mg total) by mouth at bedtime.     SUMAtriptan 100 MG tablet  Commonly known as:  IMITREX    Take 1/2 tablet at the onset of the headache. May repeat in 2 hours if need to resolve symptoms.     valACYclovir 1000 MG tablet  Commonly known as:  VALTREX  Take 2,000 mg by mouth every 12 (twelve) hours.        Allergies: No Known Allergies  Family History: Family History  Problem Relation Age of Onset  . Breast cancer Maternal Grandmother 80  . Heart attack Father   . Cancer Mother     bladder  . Kidney disease Neg Hx     Social History:  reports that she has never smoked. She has never used smokeless tobacco. She reports that she does not drink alcohol or use illicit drugs.  ROS: UROLOGY Frequent Urination?: Yes Hard to postpone urination?: No Burning/pain with urination?: Yes Get up at night to urinate?: Yes Leakage of urine?: No Urine stream starts and stops?: No Trouble starting stream?: No Do you have to strain to urinate?: No Blood in urine?: No Urinary tract infection?: Yes Sexually transmitted disease?: No Injury to kidneys or bladder?: No Painful intercourse?: No Weak stream?: No Currently pregnant?: No Vaginal bleeding?: No Last menstrual period?: n  Gastrointestinal Nausea?: No Vomiting?: No Indigestion/heartburn?: No Diarrhea?: No Constipation?: Yes  Constitutional Fever: No Night sweats?: No Weight loss?: No Fatigue?: No  Skin Skin rash/lesions?: No Itching?: No  Eyes Blurred vision?: No Double vision?: No  Ears/Nose/Throat Sore throat?: No Sinus problems?: No  Hematologic/Lymphatic Swollen glands?: No Easy bruising?: No  Cardiovascular Leg swelling?: No Chest pain?: No  Respiratory Cough?: No Shortness of breath?: No  Endocrine Excessive thirst?: No  Musculoskeletal Back pain?: No Joint pain?: No  Neurological Headaches?: Yes Dizziness?: No  Psychologic Depression?: No Anxiety?: Yes  Physical Exam: BP 153/74 mmHg  Pulse 90  Ht 5\' 4"  (1.626 m)  Wt 176 lb (79.833 kg)  BMI 30.20 kg/m2   Constitutional: Well nourished. Alert and oriented, No acute distress. HEENT: [place] AT, moist mucus membranes. Trachea midline, no masses. Cardiovascular: No clubbing, cyanosis, or edema. Respiratory: Normal respiratory effort, no increased work of breathing. Skin: No rashes, bruises or suspicious lesions. Lymph: No cervical or inguinal adenopathy. Neurologic: Grossly intact, no focal deficits, moving all 4 extremities. Psychiatric: Normal mood and affect.  Laboratory Data:  Lab Results  Component Value Date   CREATININE 0.69 03/10/2015   Lab Results  Component Value Date   TSH 2.160 11/11/2014  Component Value Date/Time   CHOL 242* 11/11/2014 1624   HDL 54 11/11/2014 1624   CHOLHDL 4.5* 11/11/2014 1624   LDLCALC 145* 11/11/2014 1624    Lab Results  Component Value Date   AST 19 11/11/2014   Lab Results  Component Value Date   ALT 14 11/11/2014   Urinalysis Results for orders placed or performed in visit on 06/07/15  Microscopic Examination  Result Value Ref Range   WBC, UA 11-30 (A) 0 -  5 /hpf   RBC, UA 0-2 0 -  2 /hpf   Epithelial Cells (non renal) 0-10 0 - 10 /hpf   Bacteria, UA None seen None seen/Few  Urinalysis, Complete  Result Value Ref Range   Specific Gravity, UA 1.010 1.005 - 1.030   pH, UA 6.0 5.0 - 7.5   Color, UA Yellow Yellow   Appearance Ur Hazy (A) Clear   Leukocytes, UA 2+ (A) Negative   Protein, UA Negative Negative/Trace   Glucose, UA Negative Negative   Ketones, UA Negative Negative   RBC, UA Trace (A) Negative   Bilirubin, UA Negative Negative   Urobilinogen, Ur 0.2 0.2 - 1.0 mg/dL   Nitrite, UA Negative Negative   Microscopic Examination See below:     Pertinent Imaging: CLINICAL DATA: Recurrent urinary tract infection. Hematuria.  EXAM: CT ABDOMEN AND PELVIS WITHOUT CONTRAST  TECHNIQUE: Multidetector CT imaging of the abdomen and pelvis was performed following the standard protocol without IV  contrast.  COMPARISON: None  FINDINGS: Lower chest: 4 mm left lower lobe nodule is identified, image 1 of series 4.  Hepatobiliary: No focal liver abnormality identified. There is a stone within the gallbladder measuring 1.9 cm.  Pancreas: No inflammation or mass identified.  Spleen: The spleen appears normal.  Adrenals/Urinary Tract: Normal adrenal glands. The kidneys are both unremarkable. No kidney stones or hydronephrosis identified. Urinary bladder appears normal.  Stomach/Bowel: The stomach is within normal limits. The small bowel loops have a normal course and caliber. No obstruction. Normal appearance of the colon.  Vascular/Lymphatic: Atherosclerosis noted. No aneurysm. No enlarged retroperitoneal or mesenteric adenopathy. No enlarged pelvic or inguinal lymph nodes.  Reproductive: The uterus and adnexal structures are on unremarkable.  Other: There is no ascites or focal fluid collections within the abdomen or pelvis. Small fat containing right inguinal hernia is identified.  Musculoskeletal: No aggressive lytic or sclerotic bone lesions identified. Degenerative disc disease is identified at the L2-3 level.  IMPRESSION: 1. No acute findings within the abdomen or pelvis. 2. Degenerative disc disease. 3. 4 mm left lower lobe lung nodule. No follow-up needed if patient is low-risk. Non-contrast chest CT can be considered in 12 months if patient is high-risk. This recommendation follows the consensus statement: Guidelines for Management of Incidental Pulmonary Nodules Detected on CT Images:From the Fleischner Society 2017; published online before print (10.1148/radiol.IJ:2314499).   Electronically Signed  By: Kerby Moors M.D.  On: 05/26/2015 15:24   Assessment & Plan:   1. Dysuria:   Resolved.  CT did not demonstrate any stones.  She will contact our office if she should experience any future dysuria.    - Urinalysis, Complete  2.  Vaginal atrophy:   Patient is not experiencing vaginal burning or itching at this time.  She also does not have a history of recurrent UTI's.  We will hold on prescribing vaginal estrogen cream at this time.  We will continue to monitor.    Return if symptoms worsen or fail to improve.  These  notes generated with voice recognition software. I apologize for typographical errors.  Zara Council, [facility] [address] [place], [place] [address] [phone]

## 2015-06-15 ENCOUNTER — Telehealth: Payer: Self-pay | Admitting: Family Medicine

## 2015-06-15 NOTE — Telephone Encounter (Signed)
Pt. Have requested that you call her at [phone] states that she took the Contrave and lost 18 lbs but have gained half of that back. She wanted to know if she could take Phentermine (cheaper)

## 2015-06-17 ENCOUNTER — Telehealth: Payer: Self-pay | Admitting: Family Medicine

## 2015-06-17 MED ORDER — NALTREXONE-BUPROPION HCL ER 8-90 MG PO TB12: ORAL_TABLET | ORAL | Status: DC

## 2015-06-17 NOTE — Telephone Encounter (Signed)
Spoke with patient because of her blood pressure, we would need to add a new BP medication before trying phentermine. Pt would like to try contrave again. Will pick up here in the office today.

## 2015-06-29 NOTE — Telephone Encounter (Signed)
Error

## 2015-07-14 LAB — URINALYSIS, COMPLETE
Bilirubin, UA: NEGATIVE
GLUCOSE, UA: NEGATIVE
KETONES UA: NEGATIVE
NITRITE UA: NEGATIVE
Protein, UA: NEGATIVE
SPEC GRAV UA: 1.01 (ref 1.005–1.030)
Urobilinogen, Ur: 0.2 mg/dL (ref 0.2–1.0)
pH, UA: 6 (ref 5.0–7.5)

## 2015-07-14 LAB — MICROSCOPIC EXAMINATION: Bacteria, UA: NONE SEEN

## 2015-07-15 ENCOUNTER — Telehealth: Payer: Self-pay | Admitting: Family Medicine

## 2015-07-15 DIAGNOSIS — F4310 Post-traumatic stress disorder, unspecified: Secondary | ICD-10-CM

## 2015-07-15 NOTE — Telephone Encounter (Signed)
Pt needs a refill on clonazepam.  She would like to pick it up tomorrow at 4:00.  Her call back number is [phone]

## 2015-07-16 MED ORDER — CLONAZEPAM 1 MG PO TABS: 1.0000 mg | ORAL_TABLET | Freq: Two times a day (BID) | ORAL | Status: DC | PRN

## 2015-07-16 NOTE — Telephone Encounter (Signed)
Please let pt know her refill is upfront and she will need an appt for further refills when she is out of refills on this one. Thanks! AK

## 2015-07-16 NOTE — Telephone Encounter (Signed)
Patient aware.[facility] 

## 2015-07-26 ENCOUNTER — Ambulatory Visit (INDEPENDENT_AMBULATORY_CARE_PROVIDER_SITE_OTHER): Payer: Medicare Other | Admitting: Family Medicine

## 2015-07-26 ENCOUNTER — Ambulatory Visit: Payer: Medicare Other | Admitting: Family Medicine

## 2015-07-26 VITALS — BP 123/80 | HR 77 | Temp 98.1°F | Ht 64.0 in | Wt 168.0 lb

## 2015-07-26 DIAGNOSIS — F4310 Post-traumatic stress disorder, unspecified: Secondary | ICD-10-CM | POA: Diagnosis not present

## 2015-07-26 DIAGNOSIS — E663 Overweight: Secondary | ICD-10-CM | POA: Diagnosis not present

## 2015-07-26 DIAGNOSIS — I10 Essential (primary) hypertension: Secondary | ICD-10-CM

## 2015-07-26 MED ORDER — CLONAZEPAM 1 MG PO TABS: 1.0000 mg | ORAL_TABLET | Freq: Two times a day (BID) | ORAL | 3 refills | Status: DC | PRN

## 2015-07-26 NOTE — Assessment & Plan Note (Signed)
Controlled. Continue current regimen. 

## 2015-07-26 NOTE — Progress Notes (Signed)
Subjective:    Patient ID: Kathy Morton, female    DOB: 1945/04/29, 70 y.o.   MRN: [id number]  HPI: Kathy TRAUGHBER is a 70 y.o. female presenting on 07/26/2015 for Hypertension (follow up)   HPI  Pt presents to follow-up on weight and anxiety. She needs refills on clonazepam today.  Taking contrave for weight loss. Has lost 8lbs. Is using home treadmill. Walking 1 mile per day. Diet- trying to eat less. Has stopped diet drinks. Is drinking lots of water. Gained back the weight. Goal of weight is 150.  Anxiety: Tries not to take clonazepam twice daily. Only takes on really bad days. Spends a lot of time alone.  CT scan for kidneys showed a lung nodule- never a smoker. 2nd hand smoke exposure. Father and step-father.   Past Medical History:  Diagnosis Date  . Anxiety   . Depression   . DVT of leg (deep venous thrombosis) ([place])   . Dysuria   . History of blood clots   . Incontinence   . Migraine   . Recurrent UTI     Current Outpatient Prescriptions on File Prior to Visit  Medication Sig  . atorvastatin (LIPITOR) 20 MG tablet Take 1 tablet (20 mg total) by mouth daily.  . cephALEXin (KEFLEX) 500 MG capsule Reported on 06/07/2015  . lisinopril (PRINIVIL,ZESTRIL) 10 MG tablet Take 1 tablet (10 mg total) by mouth daily.  . Melatonin 3 MG CAPS Take 2 capsules (6 mg total) by mouth daily. Take 1-2 capsules at bedtime.  . Naltrexone-Bupropion HCl ER 8-90 MG TB12 Take 1 tablet daily in the AM x 1 week; Take 1 tablets AM and 1 tablet PM x 1 week; Take 2 tablets AM and 1 PM x 1 week; Take 2 tablets AM and 2 tablets PM.  . polyethylene glycol powder (GLYCOLAX/MIRALAX) powder Take 17 g by mouth daily.  . QUEtiapine (SEROQUEL) 100 MG tablet Take 1 tablet (100 mg total) by mouth at bedtime.  . SUMAtriptan (IMITREX) 100 MG tablet Take 1/2 tablet at the onset of the headache. May repeat in 2 hours if need to resolve symptoms.  . valACYclovir (VALTREX) 1000 MG tablet Take 2,000 mg by mouth  every 12 (twelve) hours.   No current facility-administered medications on file prior to visit.     Review of Systems  Constitutional: Negative for chills and fever.  HENT: Negative.   Respiratory: Negative for cough, chest tightness and wheezing.   Cardiovascular: Negative for chest pain and leg swelling.  Gastrointestinal: Negative for abdominal pain, constipation, diarrhea, nausea and vomiting.  Endocrine: Negative.  Negative for cold intolerance, heat intolerance, polydipsia, polyphagia and polyuria.  Genitourinary: Negative for difficulty urinating and dysuria.  Musculoskeletal: Negative.   Neurological: Negative for dizziness, light-headedness and numbness.  Psychiatric/Behavioral: Negative.    Per HPI unless specifically indicated above     Objective:    BP 123/80 (BP Location: Right Arm, Patient Position: Sitting, Cuff Size: Normal)   Pulse 77   Temp 98.1 F (36.7 C) (Oral)   Ht 5\' 4"  (1.626 m)   Wt 168 lb (76.2 kg)   BMI 28.84 kg/m   Wt Readings from Last 3 Encounters:  07/26/15 168 lb (76.2 kg)  06/07/15 176 lb (79.8 kg)  05/18/15 176 lb 8 oz (80.1 kg)    Physical Exam  Constitutional: She is oriented to person, place, and time. She appears well-developed and well-nourished.  HENT:  Head: Normocephalic and atraumatic.  Neck: Neck supple.  Cardiovascular: Normal rate, regular rhythm and normal heart sounds.  Exam reveals no gallop and no friction rub.   No murmur heard. Pulmonary/Chest: Effort normal and breath sounds normal. She has no wheezes. She exhibits no tenderness.  Abdominal: Soft. Normal appearance and bowel sounds are normal. She exhibits no distension and no mass. There is no tenderness. There is no rebound and no guarding.  Musculoskeletal: Normal range of motion. She exhibits no edema or tenderness.  Lymphadenopathy:    She has no cervical adenopathy.  Neurological: She is alert and oriented to person, place, and time.  Skin: Skin is warm and  dry.   Results for orders placed or performed in visit on 06/07/15  Microscopic Examination  Result Value Ref Range   WBC, UA 11-30 (A) 0 - 5 /hpf   RBC, UA 0-2 0 - 2 /hpf   Epithelial Cells (non renal) 0-10 0 - 10 /hpf   Bacteria, UA None seen None seen/Few  Urinalysis, Complete  Result Value Ref Range   Specific Gravity, UA 1.010 1.005 - 1.030   pH, UA 6.0 5.0 - 7.5   Color, UA Yellow Yellow   Appearance Ur Hazy (A) Clear   Leukocytes, UA 2+ (A) Negative   Protein, UA Negative Negative/Trace   Glucose, UA Negative Negative   Ketones, UA Negative Negative   RBC, UA Trace (A) Negative   Bilirubin, UA Negative Negative   Urobilinogen, Ur 0.2 0.2 - 1.0 mg/dL   Nitrite, UA Negative Negative   Microscopic Examination See below:       Assessment & Plan:   Problem List Items Addressed This Visit      Cardiovascular and Mediastinum   Hypertension - Primary    Controlled. Continue current regimen.         Other   PTSD (post-traumatic stress disorder)    Renewed PRN clonazepam.       Relevant Medications   clonazePAM (KLONOPIN) 1 MG tablet   Overweight (BMI 25.0-29.9)    Continue Contrave. Review diet and lifestyle changes to help with weight loss. Encourage 10 minutes walking after every meal. Encouraged modified low-carb diet. Recheck 2 mos.        Other Visit Diagnoses   None.     Meds ordered this encounter  Medications  . DISCONTD: clonazePAM (KLONOPIN) 1 MG tablet    Sig: Take by mouth.  . clonazePAM (KLONOPIN) 1 MG tablet    Sig: Take 1 tablet (1 mg total) by mouth 2 (two) times daily as needed for anxiety.    Dispense:  60 tablet    Refill:  3    Order Specific Question:   Supervising Provider    Answer:   Arlis Porta [id number]      Follow up plan: Return in about 2 months (around 09/26/2015) for Weight check. Marland Kitchen

## 2015-07-26 NOTE — Patient Instructions (Signed)
Keep up the good work with your weight loss.  I would add 10 minutes after each meal walking if you can to help with added weight loss.   Please try to meet the goal of 150 minutes of exercise per week.  This is generally 30-40 minutes of moderate activity 3-4 times per week. Consider a calorie goal of 1800 per day.  Consider a modified low carb diet- pick one meal for lots of carbohydrates and then eat mainly vegetables and proteins for the other meals. Snacks should be vegetables, fruits, or nuts.  Eat mainly fruits, vegetables, and lean proteins (chicken or fish).  A great resource for healthy living and weight loss is Choosemyplate.gov

## 2015-07-26 NOTE — Assessment & Plan Note (Signed)
Continue Contrave. Review diet and lifestyle changes to help with weight loss. Encourage 10 minutes walking after every meal. Encouraged modified low-carb diet. Recheck 2 mos.

## 2015-07-26 NOTE — Assessment & Plan Note (Signed)
Renewed PRN clonazepam.

## 2015-08-06 ENCOUNTER — Telehealth: Payer: Self-pay

## 2015-08-06 NOTE — Telephone Encounter (Signed)
Patient called stating she is currently on Contrave for weight loss.  She is requesting to be on the cheaper weight loss medication that was discussed in her appointment.  Please advise

## 2015-09-23 IMAGING — MG MM DIGITAL SCREENING BILAT W/ CAD
1 series · 4 of 4 positions shown · non-contrast
Comparison: Previous Exam(s)

CLINICAL DATA: Screening.

EXAM:
DIGITAL SCREENING BILATERAL MAMMOGRAM WITH CAD

[R CC · right · 4 of 4 slices shown]
[im 1/4]
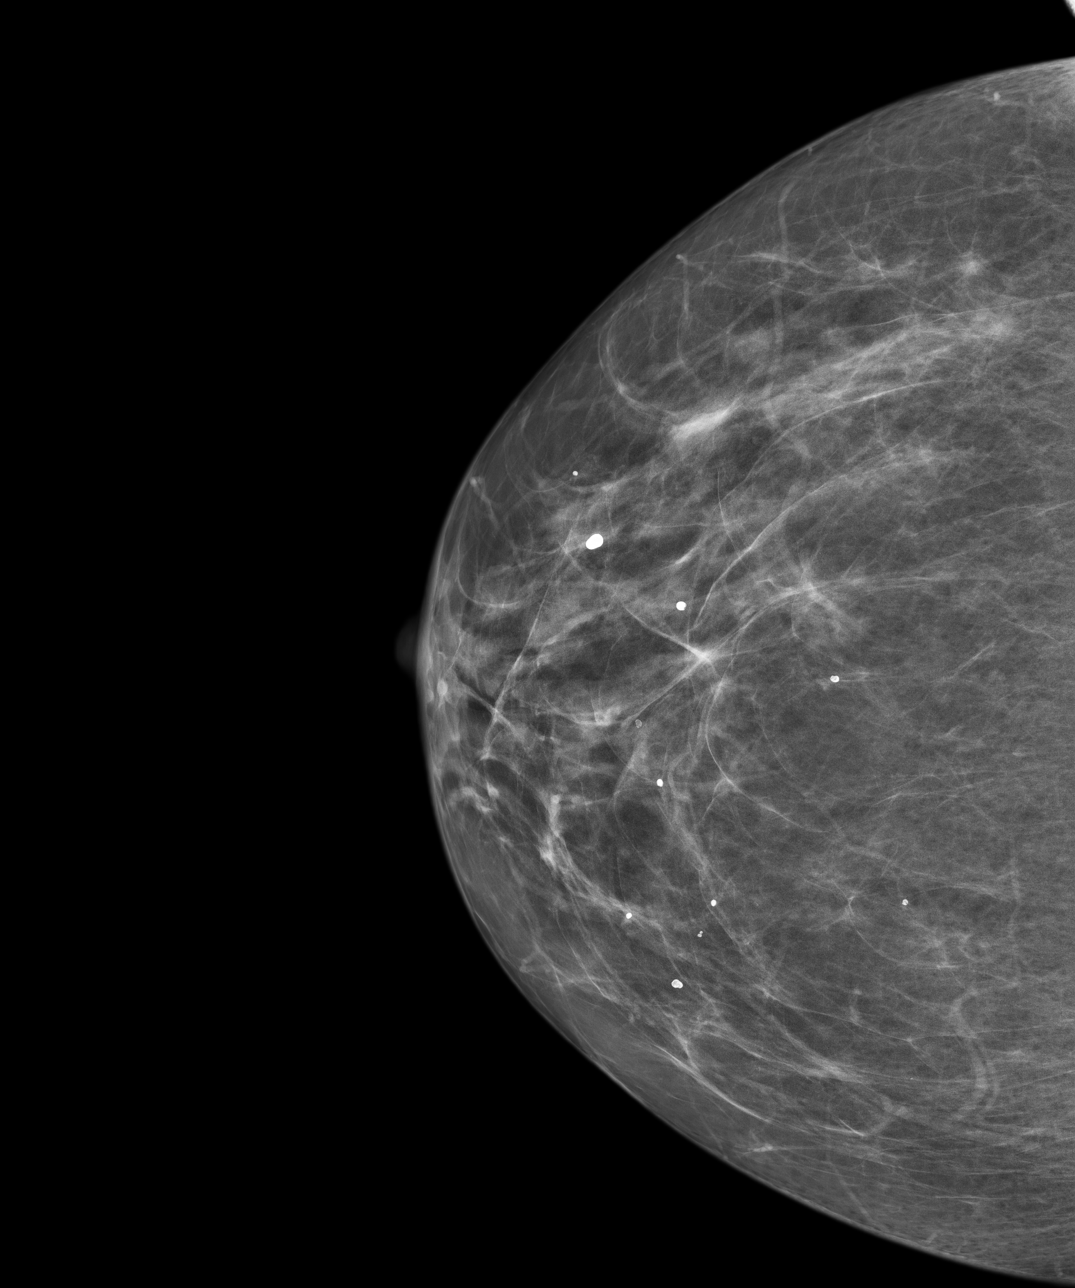
[im 2/4]
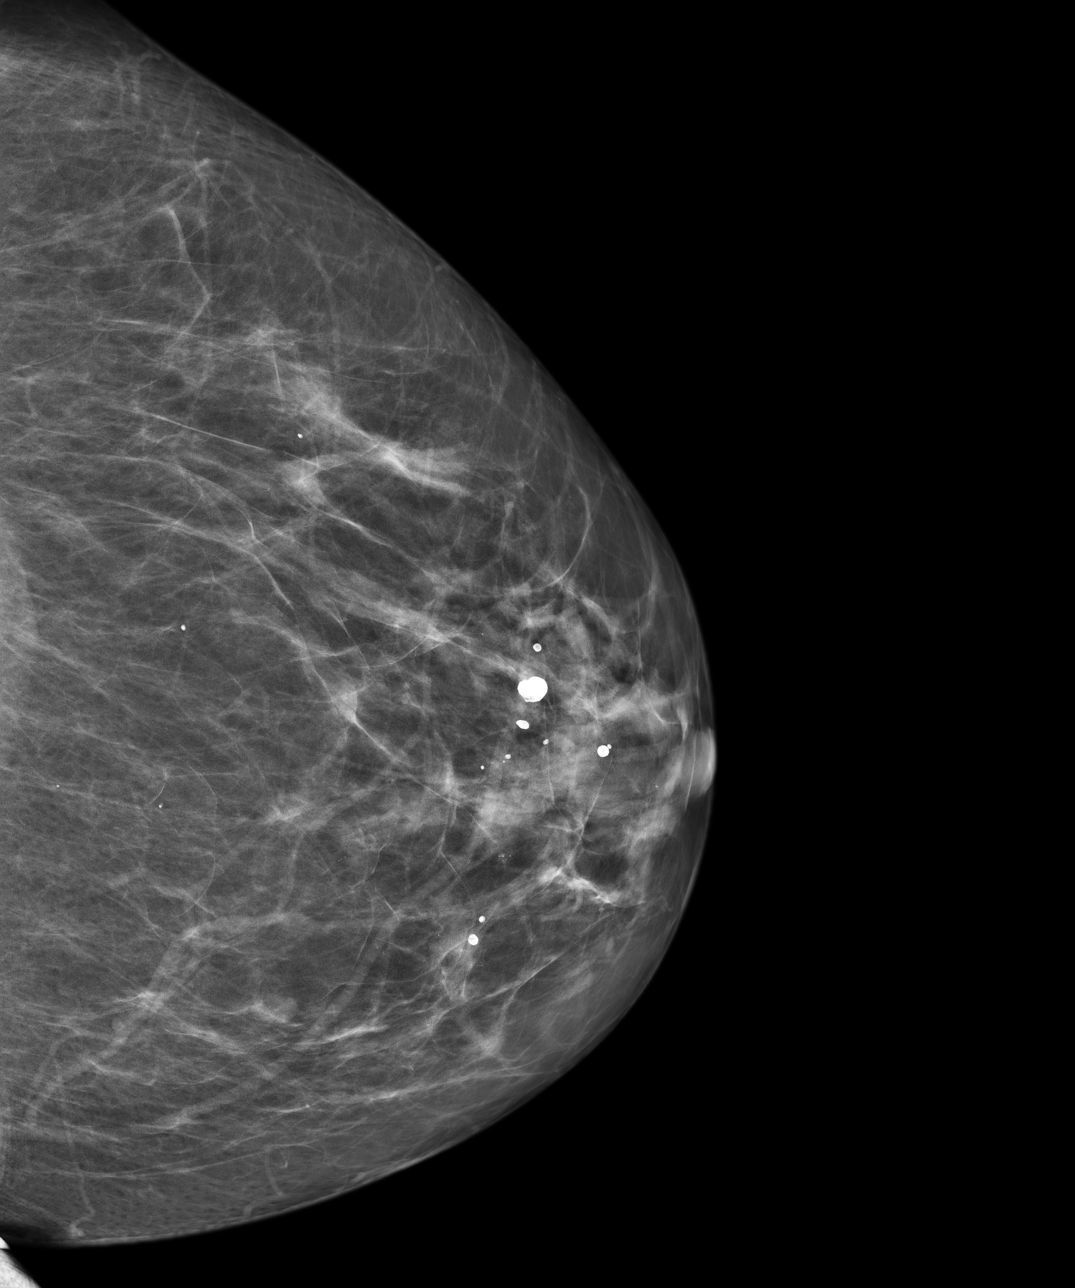
[im 3/4]
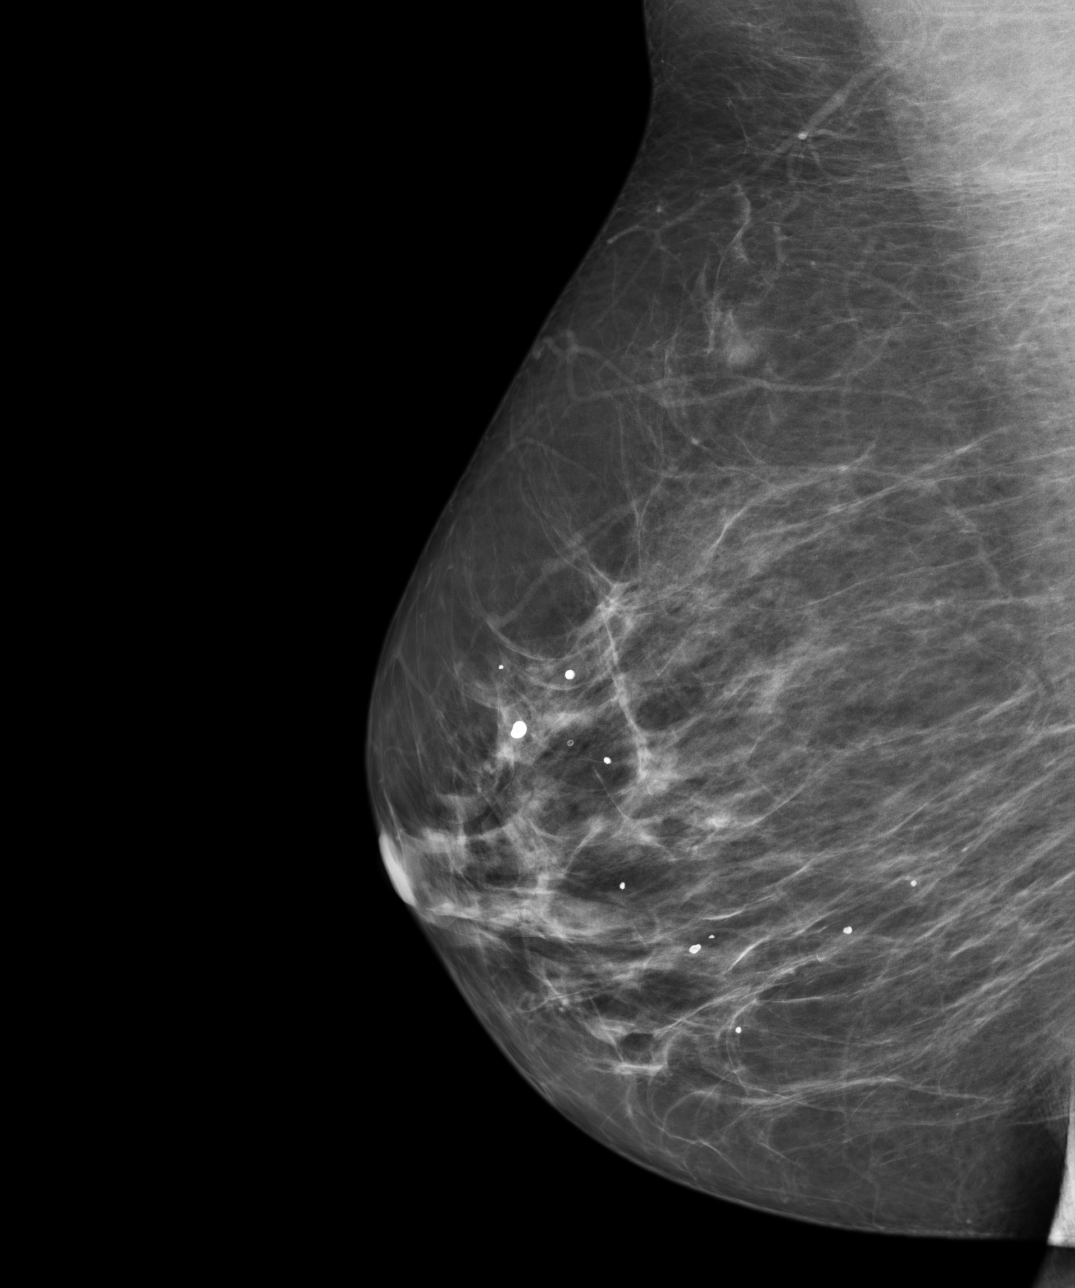
[im 4/4]
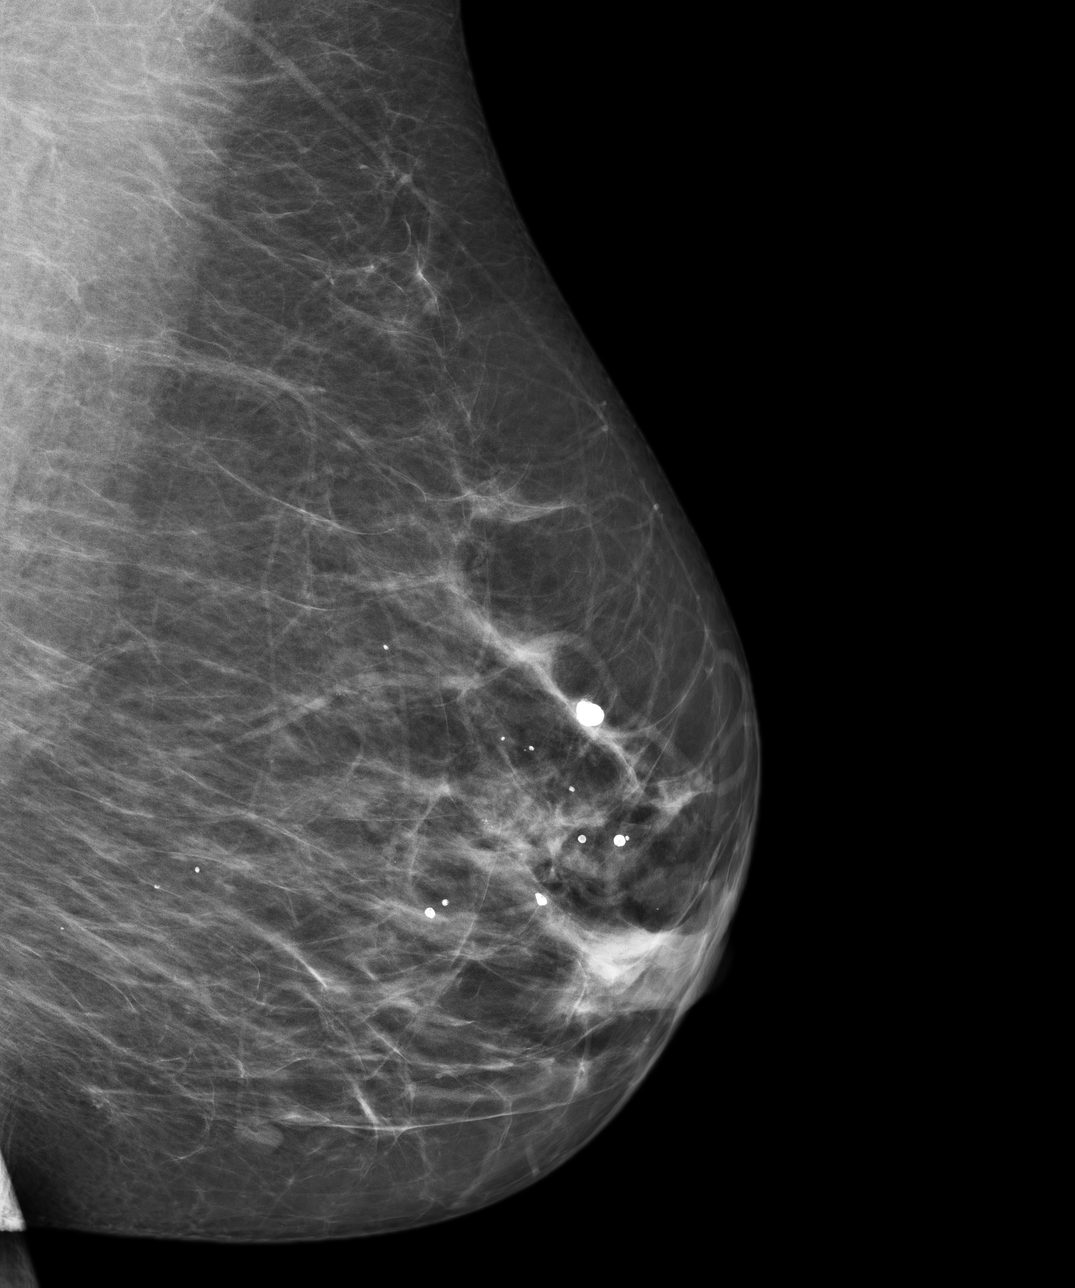

[4 of 4 positions shown; findings below may reference images not displayed]

ACR Breast Density Category b: There are scattered areas of
fibroglandular density.
FINDINGS: In the left breast, calcifications warrant further evaluation with
magnified views. In the right breast, no mass or malignant type
calcifications are identified. Images were processed with CAD.
IMPRESSION: Further evaluation is suggested for calcifications in the left
breast.

RECOMMENDATION:
Diagnostic mammogram of the left breast. (Code:[id number])

The patient will be contacted regarding the findings, and additional
imaging will be scheduled.

BI-RADS CATEGORY  0. Incomplete. Need additional imaging evaluation
and/or prior mammograms for comparison.

## 2015-09-27 ENCOUNTER — Ambulatory Visit (INDEPENDENT_AMBULATORY_CARE_PROVIDER_SITE_OTHER): Payer: Medicare Other | Admitting: Family Medicine

## 2015-09-27 ENCOUNTER — Encounter: Payer: Self-pay | Admitting: Family Medicine

## 2015-09-27 VITALS — BP 142/84 | HR 76 | Temp 99.1°F | Resp 16 | Ht 64.0 in | Wt 161.0 lb

## 2015-09-27 DIAGNOSIS — I10 Essential (primary) hypertension: Secondary | ICD-10-CM

## 2015-09-27 DIAGNOSIS — Z205 Contact with and (suspected) exposure to viral hepatitis: Secondary | ICD-10-CM | POA: Diagnosis not present

## 2015-09-27 DIAGNOSIS — E785 Hyperlipidemia, unspecified: Secondary | ICD-10-CM | POA: Diagnosis not present

## 2015-09-27 DIAGNOSIS — E663 Overweight: Secondary | ICD-10-CM | POA: Diagnosis not present

## 2015-09-27 DIAGNOSIS — Z1211 Encounter for screening for malignant neoplasm of colon: Secondary | ICD-10-CM

## 2015-09-27 LAB — BASIC METABOLIC PANEL WITH GFR
BUN: 9 mg/dL (ref 7–25)
CALCIUM: 9.4 mg/dL (ref 8.6–10.4)
CO2: 25 mmol/L (ref 20–31)
CREATININE: 0.83 mg/dL (ref 0.60–0.93)
Chloride: 103 mmol/L (ref 98–110)
GFR, EST NON AFRICAN AMERICAN: 72 mL/min (ref >=60)
GFR, Est African American: 83 mL/min (ref >=60)
GLUCOSE: 88 mg/dL (ref 65–99)
Potassium: 4.1 mmol/L (ref 3.5–5.3)
Sodium: 136 mmol/L (ref 135–146)

## 2015-09-27 LAB — LIPID PANEL
Cholesterol: 154 mg/dL (ref 125–200)
HDL: 58 mg/dL (ref >=46)
LDL CALC: 74 mg/dL (ref <130)
Total CHOL/HDL Ratio: 2.7 Ratio (ref <=5.0)
Triglycerides: 109 mg/dL (ref <150)
VLDL: 22 mg/dL (ref <30)

## 2015-09-27 NOTE — Assessment & Plan Note (Signed)
20lb weight loss since starting contrave. Doing well. Goal is 150. Would like to continue for 1 more month. Continued diet and lifestyle changes.

## 2015-09-27 NOTE — Assessment & Plan Note (Signed)
Encouraged pt to continue lisinopril today. Take daily. Check at local drug store to monitor Plan to recheck BP in 3 mos.

## 2015-09-27 NOTE — Progress Notes (Signed)
Subjective:    Patient ID: Kathy Morton, female    DOB: [birth year], 70 y.o.   MRN: [id number]  HPI: Kathy Morton is a 70 y.o. female presenting on 09/27/2015 for Weight Check (Rx refill CloneZepam)   HPI  Pt presents for weight check on Contrave. Doing well restarted in June. Starting weight was 188. Loss of 20lbs. Goal is 150bs. She would like to lose 12 more lbs. No side effects from the contrave. Is causing some constipation. Overall tolerating well.  Does not need refill on Clonazepam- has prescription for 4 mos supply. Refilled August 1- 52 pills left.  Hypertension: Skipped her medication today because her goal is to get off and wanted to determine how well her blood pressure was doing with out.  Would like to do the cologuard and have Hep C testing today.   Past Medical History:  Diagnosis Date  . Anxiety   . Depression   . DVT of leg (deep venous thrombosis) ([place])   . Dysuria   . History of blood clots   . Incontinence   . Migraine   . Recurrent UTI     Current Outpatient Prescriptions on File Prior to Visit  Medication Sig  . atorvastatin (LIPITOR) 20 MG tablet Take 1 tablet (20 mg total) by mouth daily.  . clonazePAM (KLONOPIN) 1 MG tablet Take 1 tablet (1 mg total) by mouth 2 (two) times daily as needed for anxiety.  Marland Kitchen lisinopril (PRINIVIL,ZESTRIL) 10 MG tablet Take 1 tablet (10 mg total) by mouth daily.  . Naltrexone-Bupropion HCl ER 8-90 MG TB12 Take 1 tablet daily in the AM x 1 week; Take 1 tablets AM and 1 tablet PM x 1 week; Take 2 tablets AM and 1 PM x 1 week; Take 2 tablets AM and 2 tablets PM.  . polyethylene glycol powder (GLYCOLAX/MIRALAX) powder Take 17 g by mouth daily.  . QUEtiapine (SEROQUEL) 100 MG tablet Take 1 tablet (100 mg total) by mouth at bedtime.  . SUMAtriptan (IMITREX) 100 MG tablet Take 1/2 tablet at the onset of the headache. May repeat in 2 hours if need to resolve symptoms.  . valACYclovir (VALTREX) 1000 MG tablet Take 2,000 mg by  mouth every 12 (twelve) hours.   No current facility-administered medications on file prior to visit.     Review of Systems  Constitutional: Negative for chills and fever.  HENT: Negative.   Respiratory: Negative for cough, chest tightness and wheezing.   Cardiovascular: Negative for chest pain and leg swelling.  Gastrointestinal: Negative for abdominal pain, constipation, diarrhea, nausea and vomiting.  Endocrine: Negative.  Negative for cold intolerance, heat intolerance, polydipsia, polyphagia and polyuria.  Genitourinary: Negative for difficulty urinating and dysuria.  Musculoskeletal: Negative.   Neurological: Negative for dizziness, light-headedness and numbness.  Psychiatric/Behavioral: Negative.    Per HPI unless specifically indicated above     Objective:    BP (!) 142/84 (BP Location: Left Arm)   Pulse 76   Temp 99.1 F (37.3 C) (Oral)   Resp 16   Ht 5\' 4"  (1.626 m)   Wt 161 lb (73 kg)   BMI 27.64 kg/m   Wt Readings from Last 3 Encounters:  09/27/15 161 lb (73 kg)  07/26/15 168 lb (76.2 kg)  06/07/15 176 lb (79.8 kg)    Physical Exam  Constitutional: She is oriented to person, place, and time. She appears well-developed and well-nourished.  HENT:  Head: Normocephalic and atraumatic.  Neck: Neck supple.  Cardiovascular: Normal  rate, regular rhythm and normal heart sounds.  Exam reveals no gallop and no friction rub.   No murmur heard. Pulmonary/Chest: Effort normal and breath sounds normal. She has no wheezes. She exhibits no tenderness.  Abdominal: Soft. Normal appearance and bowel sounds are normal. She exhibits no distension and no mass. There is no tenderness. There is no rebound and no guarding.  Musculoskeletal: Normal range of motion. She exhibits no edema or tenderness.  Lymphadenopathy:    She has no cervical adenopathy.  Neurological: She is alert and oriented to person, place, and time.  Skin: Skin is warm and dry.  Psychiatric: She has a normal  mood and affect. Her behavior is normal. Judgment and thought content normal.   Results for orders placed or performed in visit on 06/07/15  Microscopic Examination  Result Value Ref Range   WBC, UA 11-30 (A) 0 - 5 /hpf   RBC, UA 0-2 0 - 2 /hpf   Epithelial Cells (non renal) 0-10 0 - 10 /hpf   Bacteria, UA None seen None seen/Few  Urinalysis, Complete  Result Value Ref Range   Specific Gravity, UA 1.010 1.005 - 1.030   pH, UA 6.0 5.0 - 7.5   Color, UA Yellow Yellow   Appearance Ur Hazy (A) Clear   Leukocytes, UA 2+ (A) Negative   Protein, UA Negative Negative/Trace   Glucose, UA Negative Negative   Ketones, UA Negative Negative   RBC, UA Trace (A) Negative   Bilirubin, UA Negative Negative   Urobilinogen, Ur 0.2 0.2 - 1.0 mg/dL   Nitrite, UA Negative Negative   Microscopic Examination See below:       Assessment & Plan:   Problem List Items Addressed This Visit      Cardiovascular and Mediastinum   Hypertension    Encouraged pt to continue lisinopril today. Take daily. Check at local drug store to monitor Plan to recheck BP in 3 mos.        Relevant Orders   BASIC METABOLIC PANEL WITH GFR     Other   Hyperlipidemia    Recheck lipids today to determine efficacy of atorvastatin.       Relevant Orders   Lipid Profile   Overweight (BMI 25.0-29.9) - Primary    20lb weight loss since starting contrave. Doing well. Goal is 150. Would like to continue for 1 more month. Continued diet and lifestyle changes.        Other Visit Diagnoses    Screening for colon cancer       Ordered cologuard today.    Relevant Orders   Cologuard   Exposure to hepatitis C       Check antibody today.    Relevant Orders   Hepatitis C antibody, reflex      No orders of the defined types were placed in this encounter.     Follow up plan: Return in about 3 months (around 12/27/2015), or if symptoms worsen or fail to improve.

## 2015-09-27 NOTE — Assessment & Plan Note (Signed)
Recheck lipids today to determine efficacy of atorvastatin.

## 2015-09-27 NOTE — Patient Instructions (Signed)
Keep up the great work with your weight loss.   We will check some lab work today.   The cologuard will be sent to your house.

## 2015-09-28 LAB — HEPATITIS C ANTIBODY: HCV AB: NEGATIVE

## 2015-11-04 LAB — COLOGUARD: Cologuard: POSITIVE

## 2015-11-10 ENCOUNTER — Encounter: Payer: Self-pay | Admitting: Family Medicine

## 2015-11-12 ENCOUNTER — Encounter: Payer: Self-pay | Admitting: Family Medicine

## 2015-11-12 ENCOUNTER — Other Ambulatory Visit: Payer: Self-pay

## 2015-11-12 ENCOUNTER — Telehealth: Payer: Self-pay | Admitting: *Deleted

## 2015-11-12 ENCOUNTER — Ambulatory Visit (INDEPENDENT_AMBULATORY_CARE_PROVIDER_SITE_OTHER): Payer: Medicare Other | Admitting: Family Medicine

## 2015-11-12 VITALS — BP 159/84 | HR 79 | Temp 97.8°F | Resp 16 | Ht 64.0 in | Wt 163.0 lb

## 2015-11-12 DIAGNOSIS — K639 Disease of intestine, unspecified: Secondary | ICD-10-CM

## 2015-11-12 DIAGNOSIS — Z1211 Encounter for screening for malignant neoplasm of colon: Secondary | ICD-10-CM | POA: Diagnosis not present

## 2015-11-12 NOTE — Assessment & Plan Note (Signed)
Positive cologuard, age 70, without prior colonoscopy or other screening. No fam history colon CA - Referral placed to GI [facility]) for Diagnostic Colonoscopy given positive cologuard, briefly discussed potential timeline, bowel prep, and future discussions regarding this result and future colonoscopies, patient understood - Follow-up as planned

## 2015-11-12 NOTE — Telephone Encounter (Signed)
Patient called today and needs a colonoscopy screening done.  Patient also states she had a positive Cologuard result.  Patient states you can call her back  Her contact number is [phone]. Please advise. Thank you

## 2015-11-12 NOTE — Telephone Encounter (Signed)
Screening Colonoscopy Z12.11  [facility] 11/29/2015 Dr. Vicente Males [facility] Pre cert

## 2015-11-12 NOTE — Progress Notes (Signed)
Subjective:    Patient ID: Kathy Morton, female    DOB: December 24, 1945, 70 y.o.   MRN: [id number]  Kathy Morton is a 70 y.o. female presenting on 11/12/2015 for Referral (discuss cologuard results)  HPI  ABNORMAL COLOGUARD SCREENING / Abnormal Colon result at risk for Colon CA / CONSTIPATION - Today presents to discuss cologuard results and future colonoscopy. Patient reports had positive cologuard testing recently. This was her first colon cancer screening method, no prior colonoscopy, fecal occult, or cologuard. No known family history of colon cancer. She would like to proceed with colonoscopy referral, but was trying to avoid this if she didn't have to. - Additionally, reports chronic constipation for most of life, has been taking Miralax 1-2x weekly, stools variable sometimes hard dry other times soft and loose, however no significant bowel changes over past 6 months - Denies any significant fevers/chills, worsening abdominal pain or bloating, rectal bleeding or dark stools, early satiety, unintentional weight loss, sweats, mass   Social History  Substance Use Topics  . Smoking status: Never Smoker  . Smokeless tobacco: Never Used  . Alcohol use No    Review of Systems Per HPI unless specifically indicated above     Objective:    BP (!) 159/84 (BP Location: Right Arm, Patient Position: Sitting, Cuff Size: Normal)   Pulse 79   Temp 97.8 F (36.6 C) (Oral)   Resp 16   Ht 5\' 4"  (1.626 m)   Wt 163 lb (73.9 kg)   BMI 27.98 kg/m   Wt Readings from Last 3 Encounters:  11/12/15 163 lb (73.9 kg)  09/27/15 161 lb (73 kg)  07/26/15 168 lb (76.2 kg)    Physical Exam  Constitutional: She appears well-developed and well-nourished. No distress.  Well-appearing, comfortable, cooperative  HENT:  Head: Normocephalic and atraumatic.  Mouth/Throat: Oropharynx is clear and moist.  Cardiovascular: Normal rate, regular rhythm, normal heart sounds and intact distal pulses.   No  murmur heard. Pulmonary/Chest: Effort normal.  Abdominal: Soft. Bowel sounds are normal. She exhibits no distension and no mass. There is no tenderness.  Musculoskeletal: She exhibits no edema.  Neurological: She is alert.  Skin: Skin is warm and dry. She is not diaphoretic.  Nursing note and vitals reviewed.  Results for orders placed or performed in visit on 09/27/15  Cologuard  Result Value Ref Range   Cologuard Positive   BASIC METABOLIC PANEL WITH GFR  Result Value Ref Range   Sodium 136 135 - 146 mmol/L   Potassium 4.1 3.5 - 5.3 mmol/L   Chloride 103 98 - 110 mmol/L   CO2 25 20 - 31 mmol/L   Glucose, Bld 88 65 - 99 mg/dL   BUN 9 7 - 25 mg/dL   Creat 0.83 0.60 - 0.93 mg/dL   Calcium 9.4 8.6 - 10.4 mg/dL   GFR, Est African American 83 >=60 mL/min   GFR, Est Non African American 72 >=60 mL/min  Lipid Profile  Result Value Ref Range   Cholesterol 154 125 - 200 mg/dL   Triglycerides 109 <150 mg/dL   HDL 58 >=46 mg/dL   Total CHOL/HDL Ratio 2.7 <=5.0 Ratio   VLDL 22 <30 mg/dL   LDL Cholesterol 74 <130 mg/dL  Hepatitis C antibody  Result Value Ref Range   HCV Ab NEGATIVE NEGATIVE      Assessment & Plan:   Problem List Items Addressed This Visit    Colon cancer screening - Primary    Positive cologuard,  age 69, without prior colonoscopy or other screening. No fam history colon CA - Referral placed to GI [facility]) for Diagnostic Colonoscopy given positive cologuard, briefly discussed potential timeline, bowel prep, and future discussions regarding this result and future colonoscopies, patient understood - Follow-up as planned      Relevant Orders   Ambulatory referral to Gastroenterology    Other Visit Diagnoses    Abnormality of colon       Positive cologuard, needs diagnostic colonoscopy   Relevant Orders   Ambulatory referral to Gastroenterology      No orders of the defined types were placed in this encounter.     Follow up plan: Return  in about 3 months (around 02/12/2016) for blood pressure.  Nobie Putnam, DO [facility] 11/12/2015, 1:21 PM

## 2015-11-12 NOTE — Telephone Encounter (Signed)
Gastroenterology Pre-Procedure Review  Request Date: 11/29/2015 Requesting Physician: Dr. Vincenza Hews  PATIENT REVIEW QUESTIONS: The patient responded to the following health history questions as indicated:    1. Are you having any GI issues? Yes, constipation 2. Do you have a personal history of Polyps? no 3. Do you have a family history of Colon Cancer or Polyps? no 4. Diabetes Mellitus? no 5. Joint replacements in the past 12 months?no 6. Major health problems in the past 3 months?no 7. Any artificial heart valves, MVP, or defibrillator?no    MEDICATIONS & ALLERGIES:    Patient reports the following regarding taking any anticoagulation/antiplatelet therapy:   Plavix, Coumadin, Eliquis, Xarelto, Lovenox, Pradaxa, Brilinta, or Effient? no Aspirin? no  Patient confirms/reports the following medications:  Current Outpatient Prescriptions  Medication Sig Dispense Refill  . atorvastatin (LIPITOR) 20 MG tablet Take 1 tablet (20 mg total) by mouth daily. 90 tablet 3  . clonazePAM (KLONOPIN) 1 MG tablet Take 1 tablet (1 mg total) by mouth 2 (two) times daily as needed for anxiety. 60 tablet 3  . lisinopril (PRINIVIL,ZESTRIL) 10 MG tablet Take 1 tablet (10 mg total) by mouth daily. 90 tablet 3  . polyethylene glycol powder (GLYCOLAX/MIRALAX) powder Take 17 g by mouth daily. 3350 g 11  . QUEtiapine (SEROQUEL) 100 MG tablet Take 1 tablet (100 mg total) by mouth at bedtime. 90 tablet 3  . SUMAtriptan (IMITREX) 100 MG tablet Take 1/2 tablet at the onset of the headache. May repeat in 2 hours if need to resolve symptoms. 9 tablet 11  . valACYclovir (VALTREX) 1000 MG tablet Take 2,000 mg by mouth every 12 (twelve) hours.  0   No current facility-administered medications for this visit.     Patient confirms/reports the following allergies:  No Known Allergies  No orders of the defined types were placed in this encounter.   AUTHORIZATION INFORMATION Primary Insurance: 1D#: Group #:  Secondary  Insurance: 1D#: Group #:  SCHEDULE INFORMATION: Date: 11/29/2015 Time: Location: [facility]

## 2015-11-12 NOTE — Patient Instructions (Signed)
Thank you for coming in to clinic today.  1.  Referral placed. If you do not hear back in 1-2 weeks call their office first to check status  [facility] Location [address] [place], [place] [address] Phone: [phone]  [facility] [address] [place], [place] [address] Phone: [phone]  Lucilla Lame, MD Jonathon Bellows, MD (accepting new patients)  Please schedule a follow-up appointment with Dr. Parks Ranger as needed within 3 months for follow-up  If you have any other questions or concerns, please feel free to call the clinic or send a message through [place]. You may also schedule an earlier appointment if necessary.  Nobie Putnam, DO [facility]

## 2015-11-16 LAB — HM COLONOSCOPY

## 2015-11-16 NOTE — Telephone Encounter (Signed)
The notification/prior authorization case information was transmitted on 11/16/2015 at 1:56 PM CST. The notification/prior authorization reference number is [id number]

## 2015-11-24 ENCOUNTER — Encounter: Payer: Self-pay | Admitting: *Deleted

## 2015-11-29 ENCOUNTER — Ambulatory Visit: Payer: Medicare Other | Admitting: Anesthesiology

## 2015-11-29 ENCOUNTER — Encounter
Admission: RE | Disposition: A | Discharge status: Home / Self Care | Payer: Self-pay | Source: Ambulatory Visit | Attending: Gastroenterology

## 2015-11-29 ENCOUNTER — Ambulatory Visit
Admission: RE | Admit: 2015-11-29 | Discharge: 2015-11-29 | Disposition: A | Discharge status: Home / Self Care | Payer: Medicare Other | Source: Ambulatory Visit | Attending: Gastroenterology | Admitting: Gastroenterology

## 2015-11-29 ENCOUNTER — Encounter: Payer: Self-pay | Admitting: *Deleted

## 2015-11-29 DIAGNOSIS — Z79899 Other long term (current) drug therapy: Secondary | ICD-10-CM | POA: Diagnosis not present

## 2015-11-29 DIAGNOSIS — F419 Anxiety disorder, unspecified: Secondary | ICD-10-CM | POA: Insufficient documentation

## 2015-11-29 DIAGNOSIS — G43909 Migraine, unspecified, not intractable, without status migrainosus: Secondary | ICD-10-CM | POA: Insufficient documentation

## 2015-11-29 DIAGNOSIS — R195 Other fecal abnormalities: Secondary | ICD-10-CM | POA: Diagnosis not present

## 2015-11-29 DIAGNOSIS — I10 Essential (primary) hypertension: Secondary | ICD-10-CM | POA: Diagnosis not present

## 2015-11-29 DIAGNOSIS — K640 First degree hemorrhoids: Secondary | ICD-10-CM | POA: Diagnosis not present

## 2015-11-29 DIAGNOSIS — K648 Other hemorrhoids: Secondary | ICD-10-CM

## 2015-11-29 DIAGNOSIS — Z86718 Personal history of other venous thrombosis and embolism: Secondary | ICD-10-CM | POA: Insufficient documentation

## 2015-11-29 HISTORY — DX: Essential (primary) hypertension: I10

## 2015-11-29 HISTORY — PX: COLONOSCOPY WITH PROPOFOL: SHX5780

## 2015-11-29 SURGERY — COLONOSCOPY WITH PROPOFOL
Anesthesia: General

## 2015-11-29 MED ORDER — PHENYLEPHRINE HCL 10 MG/ML IJ SOLN
INTRAMUSCULAR | Status: DC | PRN
  Administered 2015-11-29 (×2): 100 ug via INTRAVENOUS

## 2015-11-29 MED ORDER — PROPOFOL 500 MG/50ML IV EMUL
INTRAVENOUS | Status: DC | PRN
  Administered 2015-11-29: 120 ug/kg/min via INTRAVENOUS

## 2015-11-29 MED ORDER — PROPOFOL 10 MG/ML IV BOLUS
INTRAVENOUS | Status: DC | PRN
  Administered 2015-11-29: 50 mg via INTRAVENOUS

## 2015-11-29 MED ORDER — LIDOCAINE HCL (CARDIAC) 20 MG/ML IV SOLN
INTRAVENOUS | Status: DC | PRN
  Administered 2015-11-29: 60 mg via INTRAVENOUS

## 2015-11-29 MED ORDER — SODIUM CHLORIDE 0.9 % IV SOLN
INTRAVENOUS | Status: DC
  Administered 2015-11-29: 08:00:00 via INTRAVENOUS

## 2015-11-29 NOTE — Transfer of Care (Signed)
Immediate Anesthesia Transfer of Care Note  Patient: Kathy Morton  Procedure(s) Performed: Procedure(s): COLONOSCOPY WITH PROPOFOL (N/A)  Patient Location: PACU  Anesthesia Type:General  Level of Consciousness: awake, oriented and patient cooperative  Airway & Oxygen Therapy: Patient Spontanous Breathing and Patient connected to nasal cannula oxygen  Post-op Assessment: Report given to RN, Post -op Vital signs reviewed and stable and Patient moving all extremities X 4  Post vital signs: Reviewed and stable  Last Vitals:  Vitals:   11/29/15 0717  BP: (!) 179/84  Pulse: 79  Resp: 14  Temp: 36.3 C    Last Pain:  Vitals:   11/29/15 0717  TempSrc: Tympanic         Complications: No apparent anesthesia complications

## 2015-11-29 NOTE — Op Note (Signed)
[facility] Patient Name: Kathy Morton Procedure Date: 11/29/2015 7:58 AM MRN: [id number] Account #: [id number] Date of Birth: 1945-08-08 Admit Type: Outpatient Age: 70 Room: [facility] ENDO ROOM 1 Gender: Female Note Status: Finalized Procedure:            Colonoscopy Indications:          Positive Cologuard test Providers:            Jonathon Bellows MD, MD Referring MD:         Leata Mouse (Referring MD) Medicines:            Monitored Anesthesia Care Complications:        No immediate complications. Procedure:            Pre-Anesthesia Assessment:                       - ASA Grade Assessment: II - A patient with mild                        systemic disease.                       - Prior to the procedure, a History and Physical was                        performed, and patient medications, allergies and                        sensitivities were reviewed. The patient's tolerance of                        previous anesthesia was reviewed.                       - The risks and benefits of the procedure and the                        sedation options and risks were discussed with the                        patient. All questions were answered and informed                        consent was obtained.                       After obtaining informed consent, the colonoscope was                        passed under direct vision. Throughout the procedure,                        the patient's blood pressure, pulse, and oxygen                        saturations were monitored continuously. The                        Colonoscope was introduced through the anus and                        advanced  to the the cecum, identified by the                        appendiceal orifice, IC valve and transillumination.                        The colonoscopy was performed with ease. The patient                        tolerated the procedure well. The quality of the bowel                   preparation was excellent. Findings:      The perianal and digital rectal examinations were normal.      Non-bleeding internal hemorrhoids were found during retroflexion. The       hemorrhoids were large and Grade I (internal hemorrhoids that do not       prolapse).      The exam was otherwise without abnormality on direct and retroflexion       views. Impression:           - Non-bleeding internal hemorrhoids.                       - The examination was otherwise normal on direct and                        retroflexion views.                       - No specimens collected. Recommendation:       - Discharge patient to home (with escort).                       - Resume previous diet today.                       - Repeat colonoscopy in 10 years for screening purposes.                       - Recommend fecal immunochemical test annually. Procedure Code(s):    --- Professional ---                       [phone], Colonoscopy, flexible; diagnostic, including                        collection of specimen(s) by brushing or washing, when                        performed (separate procedure) Diagnosis Code(s):    --- Professional ---                       K64.0, First degree hemorrhoids                       R19.5, Other fecal abnormalities CPT copyright 2016 American Medical Association. All rights reserved. The codes documented in this report are preliminary and upon coder review may  be revised to meet current compliance requirements. Jonathon Bellows, MD Jonathon Bellows MD, MD 11/29/2015 8:35:18 AM This report has been signed electronically. Number of Addenda: 0 Note Initiated On: 11/29/2015 7:58 AM Scope Withdrawal  Time: 0 hours 21 minutes 46 seconds  Total Procedure Duration: 0 hours 29 minutes 14 seconds       [facility]

## 2015-11-29 NOTE — Anesthesia Postprocedure Evaluation (Signed)
Anesthesia Post Note  Patient: Kathy Morton  Procedure(s) Performed: Procedure(s) (LRB): COLONOSCOPY WITH PROPOFOL (N/A)  Patient location during evaluation: Endoscopy Anesthesia Type: General Level of consciousness: awake and alert Pain management: pain level controlled Vital Signs Assessment: post-procedure vital signs reviewed and stable Respiratory status: spontaneous breathing and respiratory function stable Cardiovascular status: stable Anesthetic complications: no    Last Vitals:  Vitals:   11/29/15 0717 11/29/15 0837  BP: (!) 179/84 (!) 98/39  Pulse: 79 73  Resp: 14 15  Temp: 36.3 C (!) 36.1 C    Last Pain:  Vitals:   11/29/15 0837  TempSrc: Tympanic  PainSc: Asleep                 KEPHART,WILLIAM K

## 2015-11-29 NOTE — H&P (Signed)
Jonathon Bellows MD [address], [place], [place] [address] Phone: [phone] Fax : [phone]  Primary Care Physician:  Leata Mouse, NP Primary Gastroenterologist:  Dr. Jonathon Bellows   Pre-Procedure History & Physical: HPI:  Kathy Morton is a 70 y.o. female is here for an colonoscopy.   Past Medical History:  Diagnosis Date  . Anxiety   . Depression   . DVT of leg (deep venous thrombosis) ([place])   . Dysuria   . History of blood clots   . Hypertension   . Incontinence   . Migraine   . Recurrent UTI     Past Surgical History:  Procedure Laterality Date  . 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 KNEE SURGERY Left 2010   torn meniscus  . TONSILECTOMY, ADENOIDECTOMY, BILATERAL MYRINGOTOMY AND TUBES      Prior to Admission medications   Medication Sig Start Date End Date Taking? Authorizing Provider  atorvastatin (LIPITOR) 20 MG tablet Take 1 tablet (20 mg total) by mouth daily. 12/14/14  Yes Amy Overton Mam, NP  clonazePAM (KLONOPIN) 1 MG tablet Take 1 tablet (1 mg total) by mouth 2 (two) times daily as needed for anxiety. 07/26/15  Yes Amy Overton Mam, NP  lisinopril (PRINIVIL,ZESTRIL) 10 MG tablet Take 1 tablet (10 mg total) by mouth daily. 12/14/14  Yes Amy Overton Mam, NP  polyethylene glycol powder (GLYCOLAX/MIRALAX) powder Take 17 g by mouth daily. 02/08/15  Yes Amy Overton Mam, NP  QUEtiapine (SEROQUEL) 100 MG tablet Take 1 tablet (100 mg total) by mouth at bedtime. 04/26/15  Yes Amy Overton Mam, NP  SUMAtriptan (IMITREX) 100 MG tablet Take 1/2 tablet at the onset of the headache. May repeat in 2 hours if need to resolve symptoms. 03/15/15   Amy Overton Mam, NP  valACYclovir (VALTREX) 1000 MG tablet Take 2,000 mg by mouth every 12 (twelve) hours. 08/11/14   Historical Provider, MD    Allergies as of 11/12/2015  . (No Known Allergies)    Family History  Problem Relation Age of Onset  . Cancer Mother     bladder  . Heart  attack Father   . Breast cancer Maternal Grandmother 80  . Kidney disease Neg Hx     Social History   Social History  . Marital status: Divorced    Spouse name: N/A  . Number of children: N/A  . Years of education: N/A   Occupational History  . Not on file.   Social History Main Topics  . Smoking status: Never Smoker  . Smokeless tobacco: Never Used  . Alcohol use No  . Drug use: No  . Sexual activity: Not on file   Other Topics Concern  . Not on file   Social History Narrative  . No narrative on file    Review of Systems: See HPI, otherwise negative ROS  Physical Exam: BP (!) 179/84   Pulse 79   Temp 97.4 F (36.3 C) (Tympanic)   Resp 14   Ht 5\' 4"  (1.626 m)   Wt 163 lb (73.9 kg)   SpO2 100%   BMI 27.98 kg/m  General:   Alert,  pleasant and cooperative in NAD Head:  Normocephalic and atraumatic. Neck:  Supple; no masses or thyromegaly. Lungs:  Clear throughout to auscultation.    Heart:  Regular rate and rhythm. Abdomen:  Soft, nontender and nondistended. Normal bowel sounds, without guarding, and without rebound.   Neurologic:  Alert and  oriented x4;  grossly normal neurologically.  Impression/Plan: Kathy Morton is here for an colonoscopy to be performed for a positive cologuard test   Risks, benefits, limitations, and alternatives regarding  colonoscopy have been reviewed with the patient.  Questions have been answered.  All parties agreeable.   Jonathon Bellows, MD  11/29/2015, 7:57 AM

## 2015-11-29 NOTE — Anesthesia Preprocedure Evaluation (Signed)
Anesthesia Evaluation  Patient identified by MRN, date of birth, ID band Patient awake    Reviewed: Allergy & Precautions, NPO status , Patient's Chart, lab work & pertinent test results  History of Anesthesia Complications Negative for: history of anesthetic complications  Airway Mallampati: II       Dental   Pulmonary neg pulmonary ROS,           Cardiovascular hypertension, Pt. on medications + DVT (remote history)       Neuro/Psych Anxiety Depression negative neurological ROS     GI/Hepatic negative GI ROS, Neg liver ROS,   Endo/Other  negative endocrine ROS  Renal/GU negative Renal ROS     Musculoskeletal   Abdominal   Peds  Hematology negative hematology ROS (+)   Anesthesia Other Findings   Reproductive/Obstetrics                             Anesthesia Physical Anesthesia Plan  ASA: II  Anesthesia Plan: General   Post-op Pain Management:    Induction: Intravenous  Airway Management Planned: Nasal Cannula  Additional Equipment:   Intra-op Plan:   Post-operative Plan:   Informed Consent: I have reviewed the patients History and Physical, chart, labs and discussed the procedure including the risks, benefits and alternatives for the proposed anesthesia with the patient or authorized representative who has indicated his/her understanding and acceptance.     Plan Discussed with:   Anesthesia Plan Comments:         Anesthesia Quick Evaluation

## 2015-11-30 ENCOUNTER — Encounter: Payer: Self-pay | Admitting: Gastroenterology

## 2015-12-08 ENCOUNTER — Other Ambulatory Visit: Payer: Self-pay | Admitting: Family Medicine

## 2015-12-08 DIAGNOSIS — I10 Essential (primary) hypertension: Secondary | ICD-10-CM

## 2015-12-08 DIAGNOSIS — E785 Hyperlipidemia, unspecified: Secondary | ICD-10-CM

## 2015-12-08 MED ORDER — ATORVASTATIN CALCIUM 20 MG PO TABS: 20.0000 mg | ORAL_TABLET | Freq: Every day | ORAL | 3 refills | Status: DC

## 2015-12-08 MED ORDER — LISINOPRIL 10 MG PO TABS: 10.0000 mg | ORAL_TABLET | Freq: Every day | ORAL | 3 refills | Status: DC

## 2015-12-08 NOTE — Addendum Note (Signed)
Addended by: Devona Konig on: 12/08/2015 04:00 PM   Modules accepted: Orders

## 2015-12-22 ENCOUNTER — Encounter: Payer: Self-pay | Admitting: Family Medicine

## 2015-12-22 ENCOUNTER — Ambulatory Visit (INDEPENDENT_AMBULATORY_CARE_PROVIDER_SITE_OTHER): Payer: Medicare Other | Admitting: Family Medicine

## 2015-12-22 VITALS — BP 145/70 | HR 91 | Temp 98.9°F | Resp 16 | Ht 64.0 in | Wt 169.0 lb

## 2015-12-22 DIAGNOSIS — Z1211 Encounter for screening for malignant neoplasm of colon: Secondary | ICD-10-CM | POA: Diagnosis not present

## 2015-12-22 DIAGNOSIS — H8110 Benign paroxysmal vertigo, unspecified ear: Secondary | ICD-10-CM | POA: Diagnosis not present

## 2015-12-22 DIAGNOSIS — K648 Other hemorrhoids: Secondary | ICD-10-CM

## 2015-12-22 DIAGNOSIS — J0100 Acute maxillary sinusitis, unspecified: Secondary | ICD-10-CM

## 2015-12-22 MED ORDER — AMOXICILLIN-POT CLAVULANATE 875-125 MG PO TABS: 1.0000 | ORAL_TABLET | Freq: Two times a day (BID) | ORAL | 0 refills | Status: DC

## 2015-12-22 NOTE — Assessment & Plan Note (Signed)
Suspected BPPV (unspecified laterality, declined Youth worker in office today), possible trigger with Sinusitis - No other significant neurological findings or focal deficits  Plan: 1. Treat sinus with Augmentin 2. Handout given with Epley maneuver TID for 1-2 weeks until resolved 3. Trial OTC meclizine PRN for breakthrough symptoms 4. Return criteria, if not improved consider ENT vs vestibular PT referral

## 2015-12-22 NOTE — Progress Notes (Signed)
Subjective:    Patient ID: Kathy Morton, female    DOB: 08-18-1945, 70 y.o.   MRN: [id number]  Kathy Morton is a 70 y.o. female presenting on 12/22/2015 for Ear Pain and Dizziness (as the moves everything spins around onset 10 days as per pt )  Patient presents for a same day appointment.  HPI  SINUSITIS / EAR PAIN with VERTIGO: - Reports symptoms started about 1 week ago with sinus pressure and congestion, also with associated bilateral ear pressure and discomfort. Started to experience some symptoms of vertigo with room spinning, thought it was her inner ear problem, has had this before about 1 year ago lasted for 2 weeks similar circumstances with sinus infection and resolved with antibiotic treatment, has never done Epley maneuver or had vestibular rehab. For her sinuses, she tried mucinex which helped, did get some chest congestion without cough - Denies fever/chills, sweats, nausea, vomiting, lightheadedness, more constant dizziness, numbness, weakness, tingling, fall or injury   Social History  Substance Use Topics  . Smoking status: Never Smoker  . Smokeless tobacco: Never Used  . Alcohol use No    Review of Systems Per HPI unless specifically indicated above     Objective:    BP (!) 145/70   Pulse 91   Temp 98.9 F (37.2 C) (Oral)   Resp 16   Ht 5\' 4"  (1.626 m)   Wt 169 lb (76.7 kg)   SpO2 98%   BMI 29.01 kg/m   Wt Readings from Last 3 Encounters:  12/22/15 169 lb (76.7 kg)  11/29/15 163 lb (73.9 kg)  11/12/15 163 lb (73.9 kg)    Physical Exam  Constitutional: She appears well-developed and well-nourished. No distress.  Well-appearing, comfortable, cooperative  HENT:  Head: Normocephalic and atraumatic.  Frontal / maxillary sinuses mild tender bilateral. Nares mostly patent with some turbinate edema and congestion without purulence. Bilateral TMs clear without erythema, effusion or bulging. Oropharynx clear without erythema, exudates, edema or  asymmetry.  Patient declined Dix-Hallpike maneuver for vertigo testing, she did not want an episode of vertigo provoked for diagnostic purposes.  Eyes: Conjunctivae and EOM are normal. Pupils are equal, round, and reactive to light. Right eye exhibits no discharge. Left eye exhibits no discharge.  Neck: Normal range of motion. Neck supple.  Cardiovascular: Normal rate and intact distal pulses.   Pulmonary/Chest: Effort normal.  Lymphadenopathy:    She has no cervical adenopathy.  Neurological: She is alert. Coordination normal.  Skin: Skin is warm and dry. She is not diaphoretic.  Psychiatric: Her behavior is normal.  Nursing note and vitals reviewed.      Assessment & Plan:   Problem List Items Addressed This Visit    Benign paroxysmal positional vertigo    Suspected BPPV (unspecified laterality, declined Dix Hallpike in office today), possible trigger with Sinusitis - No other significant neurological findings or focal deficits  Plan: 1. Treat sinus with Augmentin 2. Handout given with Epley maneuver TID for 1-2 weeks until resolved 3. Trial OTC meclizine PRN for breakthrough symptoms 4. Return criteria, if not improved consider ENT vs vestibular PT referral       Other Visit Diagnoses    Acute non-recurrent maxillary sinusitis    -  Primary  Consistent with subacute maxillary/generalized sinusitis, likely initially viral URI component with worsening concern for bacterial infection, and complicated vertigo with possible inner ear problem.   Plan: 1. Start Augmentin 875-125mg  PO BID x 10 days 2. Finish Mucinex 7-10  days 3. May start Loratadine (Claritin) 10mg  daily and Flonase 2 sprays in each nostril daily for next 4-6 weeks, then may stop and use seasonally or as needed 4. Supportive care with nasal saline OTC, hydration, warm compresses 5. Return criteria reviewed   Relevant Medications   amoxicillin-clavulanate (AUGMENTIN) 875-125 MG tablet       Meds ordered this  encounter  Medications  . amoxicillin-clavulanate (AUGMENTIN) 875-125 MG tablet    Sig: Take 1 tablet by mouth 2 (two) times daily.    Dispense:  20 tablet    Refill:  0      Follow up plan: Return in about 4 weeks (around 01/19/2016), or if symptoms worsen or fail to improve, for vertigo.  Nobie Putnam, [facility] 12/22/2015, 10:46 AM

## 2015-12-22 NOTE — Patient Instructions (Signed)
Thank you for coming in to clinic today.  1. It sounds like you have a Sinusitis (Bacterial Infection) - this most likely started as an Upper Respiratory Virus that has settled into an infection. - Start Augmentin 1 pill twice daily (breakfast and dinner, with food and plenty of water) for 10 days, complete entire course, do not stop early even if feeling better - Consider starting OTC Loratadine (Claritin) 10mg  daily and Flonase 2 sprays in each nostril daily for next 4-6 weeks, then you may stop and use seasonally or as needed - Recommend to keep using Nasal Saline spray multiple times a day to help flush out congestion and clear sinuses - Improve hydration by drinking plenty of clear fluids (water, gatorade) to reduce secretions and thin congestion - Use mucinex up to 7-10 days   You have symptoms of Vertigo (Benign Paroxysmal Positional Vertigo) - This is commonly caused by inner ear fluid imbalance, sometimes can be worsened by allergies and sinus symptoms, otherwise it can occur randomly sometimes and we may never discover the exact cause. - To treat this, try the Epley Manuever (see diagrams/instructions below) at home up to 3 times a day for 1-2 weeks or until symptoms resolve - You may take Meclizine as needed up to 3 times a day for dizziness, this will not cure symptoms but may help. Caution may make you drowsy.  If you develop significant worsening episode with vertigo that does not improve and you get severe headache, loss of vision, arm or leg weakness, slurred speech, or other concerning symptoms please seek immediate medical attention at Emergency Department.  Please schedule a follow-up appointment with Dr Parks Ranger within 4 weeks if Vertigo not improving, and will consider Referral to Vestibular Rehab  See the next page for images describing the Epley  Manuever.     ----------------------------------------------------------------------------------------------------------------------        Please schedule a follow-up appointment with Dr. Parks Ranger in 2-4 weeks as needed if not improving vertigo  If you have any other questions or concerns, please feel free to call the clinic or send a message through [place]. You may also schedule an earlier appointment if necessary.  Nobie Putnam, DO [place]

## 2016-02-15 ENCOUNTER — Ambulatory Visit: Payer: Medicare Other | Admitting: General Surgery

## 2016-02-16 ENCOUNTER — Encounter: Payer: Self-pay | Admitting: General Surgery

## 2016-02-21 ENCOUNTER — Encounter: Payer: Self-pay | Admitting: General Surgery

## 2016-02-21 ENCOUNTER — Ambulatory Visit (INDEPENDENT_AMBULATORY_CARE_PROVIDER_SITE_OTHER): Payer: Medicare Other | Admitting: General Surgery

## 2016-02-21 VITALS — BP 124/70 | HR 74 | Resp 12 | Ht 64.0 in | Wt 170.0 lb

## 2016-02-21 DIAGNOSIS — N6022 Fibroadenosis of left breast: Secondary | ICD-10-CM

## 2016-02-21 DIAGNOSIS — Z803 Family history of malignant neoplasm of breast: Secondary | ICD-10-CM

## 2016-02-21 NOTE — Patient Instructions (Signed)
Patient to return to her PCP for yearly mammogram and breast checks.  

## 2016-02-21 NOTE — Progress Notes (Signed)
Patient ID: Kathy Morton, female   DOB: [birth year], 71 y.o.   MRN: [id number]  Chief Complaint  Patient presents with  . Follow-up    HPI Kathy Morton is a 71 y.o. female who presents for a breast evaluation. The most recent mammogram was done on 02/14/2016 .  Patient does perform regular self breast checks and has regular mammograms performed. No problems or complaints raised. I have reviewed the history of present illness with the patient.    HPI  Past Medical History:  Diagnosis Date  . Anxiety   . Depression   . DVT of leg (deep venous thrombosis) ([place])   . Dysuria   . History of blood clots   . Hypertension   . Incontinence   . Migraine   . Recurrent UTI     Past Surgical History:  Procedure Laterality Date  . 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 COLONOSCOPY WITH PROPOFOL N/A 11/29/2015   Procedure: COLONOSCOPY WITH PROPOFOL;  Surgeon: Jonathon Bellows, MD;  Location: [facility] ENDOSCOPY;  Service: Endoscopy;  Laterality: N/A;  . KNEE SURGERY Left 2010   torn meniscus  . TONSILECTOMY, ADENOIDECTOMY, BILATERAL MYRINGOTOMY AND TUBES      Family History  Problem Relation Age of Onset  . Cancer Mother     bladder  . Heart attack Father   . Breast cancer Maternal Grandmother 80  . Kidney disease Neg Hx     Social History Social History  Substance Use Topics  . Smoking status: Never Smoker  . Smokeless tobacco: Never Used  . Alcohol use No    No Known Allergies  Current Outpatient Prescriptions  Medication Sig Dispense Refill  . amoxicillin-clavulanate (AUGMENTIN) 875-125 MG tablet Take 1 tablet by mouth 2 (two) times daily. 20 tablet 0  . atorvastatin (LIPITOR) 20 MG tablet Take 1 tablet (20 mg total) by mouth daily. 90 tablet 3  . clonazePAM (KLONOPIN) 1 MG tablet Take 1 tablet (1 mg total) by mouth 2 (two) times daily as needed for anxiety. 60 tablet 3  . lisinopril (PRINIVIL,ZESTRIL) 10 MG tablet Take 1 tablet (10 mg total) by  mouth daily. 90 tablet 3  . polyethylene glycol powder (GLYCOLAX/MIRALAX) powder Take 17 g by mouth daily. 3350 g 11  . QUEtiapine (SEROQUEL) 100 MG tablet Take 1 tablet (100 mg total) by mouth at bedtime. 90 tablet 3  . SUMAtriptan (IMITREX) 100 MG tablet Take 1/2 tablet at the onset of the headache. May repeat in 2 hours if need to resolve symptoms. 9 tablet 11  . valACYclovir (VALTREX) 1000 MG tablet Take 2,000 mg by mouth every 12 (twelve) hours.  0   No current facility-administered medications for this visit.     Review of Systems Review of Systems  Constitutional: Negative.   Respiratory: Negative.   Cardiovascular: Negative.     Blood pressure 124/70, pulse 74, resp. rate 12, height 5\' 4"  (1.626 m), weight 170 lb (77.1 kg).  Physical Exam Physical Exam  Constitutional: She is oriented to person, place, and time. She appears well-developed and well-nourished.  Eyes: Conjunctivae are normal. No scleral icterus.  Neck: Neck supple.  Cardiovascular: Normal rate, regular rhythm and normal heart sounds.   Pulmonary/Chest: Effort normal and breath sounds normal. Right breast exhibits no inverted nipple, no mass, no nipple discharge, no skin change and no tenderness. Left breast exhibits no inverted nipple, no mass, no nipple discharge, no skin change and no tenderness.  Abdominal: Soft.  Bowel sounds are normal. There is no tenderness.  Lymphadenopathy:    She has no cervical adenopathy.    She has no axillary adenopathy.  Neurological: She is alert and oriented to person, place, and time.  Skin: Skin is warm and dry.    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    This information has been scribed by Gaspar Cola CMA.     Pearse Shiffler G 02/22/2016, 12:14 PM

## 2016-02-28 ENCOUNTER — Other Ambulatory Visit: Payer: Self-pay | Admitting: Obstetrics and Gynecology

## 2016-02-28 DIAGNOSIS — Z13820 Encounter for screening for osteoporosis: Secondary | ICD-10-CM

## 2016-02-28 DIAGNOSIS — M8580 Other specified disorders of bone density and structure, unspecified site: Secondary | ICD-10-CM

## 2016-02-29 ENCOUNTER — Other Ambulatory Visit: Payer: Self-pay | Admitting: Family Medicine

## 2016-02-29 DIAGNOSIS — F4310 Post-traumatic stress disorder, unspecified: Secondary | ICD-10-CM

## 2016-02-29 MED ORDER — CLONAZEPAM 1 MG PO TABS: 1.0000 mg | ORAL_TABLET | Freq: Two times a day (BID) | ORAL | 5 refills | Status: DC | PRN

## 2016-02-29 NOTE — Telephone Encounter (Signed)
Chronic PTSD / Anxiety, on clonazepam chronically takes 1mg  BID PRN, tries not to take twice daily every day by prior record, seen patient in office but not for this refill. Checked [place] CSRS today for past 1 year, appears appropriate only Clonazepam rx from prior PCP Amy Krebs, last filled 01/21/16. Phoned in refill Clonazepam 1mg  tabs take BID PRN anxiety #60, +5 refills for total 6 month or more supply.  Nobie Putnam, [facility] 02/29/2016, 2:23 PM

## 2016-03-07 ENCOUNTER — Telehealth: Payer: Self-pay | Admitting: Obstetrics and Gynecology

## 2016-03-07 NOTE — Telephone Encounter (Signed)
Pt aware of unsatisfactory pap smear results. She will RTO in 4 months for repeat pap smear.

## 2016-03-08 ENCOUNTER — Other Ambulatory Visit: Payer: Self-pay | Admitting: Family Medicine

## 2016-03-08 ENCOUNTER — Telehealth: Payer: Self-pay | Admitting: Family Medicine

## 2016-03-08 DIAGNOSIS — F5101 Primary insomnia: Secondary | ICD-10-CM

## 2016-03-08 DIAGNOSIS — G4700 Insomnia, unspecified: Secondary | ICD-10-CM

## 2016-03-08 MED ORDER — QUETIAPINE FUMARATE 100 MG PO TABS: 100.0000 mg | ORAL_TABLET | Freq: Every day | ORAL | 3 refills | Status: DC

## 2016-03-08 MED ORDER — POLYETHYLENE GLYCOL 3350 17 GM/SCOOP PO POWD: 17.0000 g | Freq: Every day | ORAL | 11 refills | Status: DC

## 2016-03-08 NOTE — Telephone Encounter (Signed)
Pt. Called requesting a refill on Quetiapine. Pt call back  # is [phone]

## 2016-03-08 NOTE — Progress Notes (Signed)
Duplicate encounter

## 2016-04-03 ENCOUNTER — Other Ambulatory Visit: Payer: Self-pay | Admitting: Family Medicine

## 2016-04-03 MED ORDER — CONTRAVE 8-90 MG PO TB12: 2.0000 | ORAL_TABLET | Freq: Two times a day (BID) | ORAL | 2 refills | Status: DC

## 2016-04-17 ENCOUNTER — Ambulatory Visit
Admission: RE | Admit: 2016-04-17 | Discharge: 2016-04-17 | Disposition: A | Discharge status: Home / Self Care | Payer: Medicare Other | Source: Ambulatory Visit | Attending: Obstetrics and Gynecology | Admitting: Obstetrics and Gynecology

## 2016-04-17 DIAGNOSIS — M8580 Other specified disorders of bone density and structure, unspecified site: Secondary | ICD-10-CM

## 2016-04-17 DIAGNOSIS — M8588 Other specified disorders of bone density and structure, other site: Secondary | ICD-10-CM | POA: Diagnosis not present

## 2016-04-17 DIAGNOSIS — Z13820 Encounter for screening for osteoporosis: Secondary | ICD-10-CM | POA: Diagnosis present

## 2016-04-26 ENCOUNTER — Telehealth: Payer: Self-pay | Admitting: Obstetrics and Gynecology

## 2016-04-26 DIAGNOSIS — M85851 Other specified disorders of bone density and structure, right thigh: Secondary | ICD-10-CM

## 2016-04-26 DIAGNOSIS — M85852 Other specified disorders of bone density and structure, left thigh: Principal | ICD-10-CM

## 2016-04-26 NOTE — Telephone Encounter (Signed)
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 Too many degenerative changes to use lumbar spine. T scores of hip improved from 2013 DEXA. Cont ca/Vit D/exercise.

## 2016-06-27 ENCOUNTER — Telehealth: Payer: Self-pay | Admitting: Family Medicine

## 2016-06-27 NOTE — Telephone Encounter (Signed)
Called pt to schedule Annual Wellness Visit with NHA  - knb  °

## 2016-08-22 ENCOUNTER — Ambulatory Visit (INDEPENDENT_AMBULATORY_CARE_PROVIDER_SITE_OTHER): Payer: Medicare Other

## 2016-08-22 VITALS — BP 110/60 | HR 74 | Temp 98.4°F | Resp 16 | Ht 62.0 in | Wt 174.4 lb

## 2016-08-22 DIAGNOSIS — Z0000 Encounter for general adult medical examination without abnormal findings: Secondary | ICD-10-CM

## 2016-08-22 NOTE — Progress Notes (Signed)
Subjective:   Kathy Morton is a 71 y.o. female who presents for Medicare Annual (Subsequent) preventive examination.  Review of Systems:  Cardiac Risk Factors include: advanced age (>33men, >78 women);obesity (BMI >30kg/m2);hypertension;dyslipidemia     Objective:     Vitals: BP 110/60 (BP Location: Right Arm, Patient Position: Sitting)   Pulse 74   Temp 98.4 F (36.9 C)   Resp 16   Ht 5\' 2"  (1.575 m)   Wt 174 lb 6.4 oz (79.1 kg)   BMI 31.90 kg/m   Body mass index is 31.9 kg/m.   Tobacco History  Smoking Status  . Never Smoker  Smokeless Tobacco  . Never Used     Counseling given: Not Answered   Past Medical History:  Diagnosis Date  . Anxiety   . Depression   . DVT of leg (deep venous thrombosis) ([place])   . Dysuria   . History of blood clots   . Hypertension   . Incontinence   . Migraine   . Recurrent UTI    Past Surgical History:  Procedure Laterality Date  . 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 COLONOSCOPY WITH PROPOFOL N/A 11/29/2015   Procedure: COLONOSCOPY WITH PROPOFOL;  Surgeon: Jonathon Bellows, MD;  Location: [facility] ENDOSCOPY;  Service: Endoscopy;  Laterality: N/A;  . KNEE SURGERY Left 2010   torn meniscus  . TONSILECTOMY, ADENOIDECTOMY, BILATERAL MYRINGOTOMY AND TUBES     Family History  Problem Relation Age of Onset  . Cancer Mother        bladder  . Heart attack Father   . Breast cancer Maternal Grandmother 80  . Kidney disease Neg Hx    History  Sexual Activity  . Sexual activity: Not on file    Outpatient Encounter Prescriptions as of 08/22/2016  Medication Sig  . atorvastatin (LIPITOR) 20 MG tablet Take 1 tablet (20 mg total) by mouth daily.  . clonazePAM (KLONOPIN) 1 MG tablet Take 1 tablet (1 mg total) by mouth 2 (two) times daily as needed for anxiety.  Marland Kitchen lisinopril (PRINIVIL,ZESTRIL) 10 MG tablet Take 1 tablet (10 mg total) by mouth daily.  . polyethylene glycol powder (GLYCOLAX/MIRALAX) powder  Take 17 g by mouth daily.  . QUEtiapine (SEROQUEL) 100 MG tablet Take 1 tablet (100 mg total) by mouth at bedtime.  . SUMAtriptan (IMITREX) 100 MG tablet Take 1/2 tablet at the onset of the headache. May repeat in 2 hours if need to resolve symptoms.  . valACYclovir (VALTREX) 1000 MG tablet Take 2,000 mg by mouth every 12 (twelve) hours.  . CONTRAVE 8-90 MG TB12 Take 2 tablets by mouth 2 (two) times daily. (Patient not taking: Reported on 08/22/2016)  . [DISCONTINUED] amoxicillin-clavulanate (AUGMENTIN) 875-125 MG tablet Take 1 tablet by mouth 2 (two) times daily.  . [DISCONTINUED] ESTRACE VAGINAL 0.1 MG/GM vaginal cream Place 1 g vaginally as directed.   No facility-administered encounter medications on file as of 08/22/2016.     Activities of Daily Living In your present state of health, do you have any difficulty performing the following activities: 08/22/2016 11/12/2015  Hearing? N N  Vision? N N  Difficulty concentrating or making decisions? N N  Walking or climbing stairs? N N  Dressing or bathing? N N  Doing errands, shopping? N N  Preparing Food and eating ? N -  Using the Toilet? N -  In the past six months, have you accidently leaked urine? Y -  Comment pads -  Do you have problems with loss of bowel control? N -  Managing your Medications? N -  Managing your Finances? N -  Housekeeping or managing your Housekeeping? N -  Some recent data might be hidden    Patient Care Team: Olin Hauser, DO as PCP - General (Family Medicine)    Assessment:     Exercise Activities and Dietary recommendations Current Exercise Habits: The patient does not participate in regular exercise at present  Goals    None     Fall Risk Fall Risk  08/22/2016 02/01/2015 11/09/2014 11/09/2014  Falls in the past year? No Yes - Yes  Number falls in past yr: - 1 1 1   Injury with Fall? - Yes No No  Comment - pt fell in garage and had shoulder pain but improving now - -   Depression  Screen PHQ 2/9 Scores 08/22/2016 02/01/2015 11/09/2014  PHQ - 2 Score 1 0 0  PHQ- 9 Score 6 - -     Cognitive Function     6CIT Screen 08/22/2016  What Year? 0 points  What month? 0 points  What time? 0 points  Count back from 20 0 points  Months in reverse 0 points  Repeat phrase 0 points  Total Score 0    Immunization History  Administered Date(s) Administered  . Influenza-Unspecified 11/06/2015  . Pneumococcal Conjugate-13 11/09/2014  . Zoster 01/03/2008   Screening Tests Health Maintenance  Topic Date Due  . INFLUENZA VACCINE  08/02/2016  . MAMMOGRAM  02/13/2018  . TETANUS/TDAP  01/02/2021  . COLONOSCOPY  11/28/2025  . DEXA SCAN  Completed  . Hepatitis C Screening  Completed  . PNA vac Low Risk Adult  Completed      Plan:     I have personally reviewed and addressed the Medicare Annual Wellness questionnaire and have noted the following in the patient's chart:  A. Medical and social history B. Use of alcohol, tobacco or illicit drugs  C. Current medications and supplements D. Functional ability and status E.  Nutritional status F.  Physical activity G. Advance directives H. List of other physicians I.  Hospitalizations, surgeries, and ER visits in previous 12 months J.  [place] such as hearing and vision if needed, cognitive and depression L. Referrals and appointments   In addition, I have reviewed and discussed with patient certain preventive protocols, quality metrics, and best practice recommendations. A written personalized care plan for preventive services as well as general preventive health recommendations were provided to patient.   Signed,  Tyler Aas, LPN Nurse Health Advisor   MD Recommendations:none

## 2016-08-22 NOTE — Patient Instructions (Signed)
Kathy Morton , Thank you for taking time to come for your Medicare Wellness Visit. I appreciate your ongoing commitment to your health goals. Please review the following plan we discussed and let me know if I can assist you in the future.   Screening recommendations/referrals: Colonoscopy: completed 11/29/2015 Mammogram: completed 02/14/2016 Bone Density: completed 04/17/2016 Recommended yearly ophthalmology/optometry visit for glaucoma screening and checkup Recommended yearly dental visit for hygiene and checkup  Vaccinations: Influenza vaccine: up to date, due 09/2016 Pneumococcal vaccine: up to date Tdap vaccine: up to date Shingles vaccine: up to date   Advanced directives:Please bring a copy of your health care power of attorney and living will to the office at your convenience.  Conditions/risks identified: none  Next appointment: Follow up in one year for your annual wellness exam.    Preventive Care 65 Years and Older, Female Preventive care refers to lifestyle choices and visits with your health care provider that can promote health and wellness. What does preventive care include?  A yearly physical exam. This is also called an annual well check.  Dental exams once or twice a year.  Routine eye exams. Ask your health care provider how often you should have your eyes checked.  Personal lifestyle choices, including:  Daily care of your teeth and gums.  Regular physical activity.  Eating a healthy diet.  Avoiding tobacco and drug use.  Limiting alcohol use.  Practicing safe sex.  Taking low-dose aspirin every day.  Taking vitamin and mineral supplements as recommended by your health care provider. What happens during an annual well check? The services and screenings done by your health care provider during your annual well check will depend on your age, overall health, lifestyle risk factors, and family history of disease. Counseling  Your health care provider  may ask you questions about your:  Alcohol use.  Tobacco use.  Drug use.  Emotional well-being.  Home and relationship well-being.  Sexual activity.  Eating habits.  History of falls.  Memory and ability to understand (cognition).  Work and work Statistician.  Reproductive health. Screening  You may have the following tests or measurements:  Height, weight, and BMI.  Blood pressure.  Lipid and cholesterol levels. These may be checked every 5 years, or more frequently if you are over 35 years old.  Skin check.  Lung cancer screening. You may have this screening every year starting at age 1 if you have a 30-pack-year history of smoking and currently smoke or have quit within the past 15 years.  Fecal occult blood test (FOBT) of the stool. You may have this test every year starting at age 73.  Flexible sigmoidoscopy or colonoscopy. You may have a sigmoidoscopy every 5 years or a colonoscopy every 10 years starting at age 71.  Hepatitis C blood test.  Hepatitis B blood test.  Sexually transmitted disease (STD) testing.  Diabetes screening. This is done by checking your blood sugar (glucose) after you have not eaten for a while (fasting). You may have this done every 1-3 years.  Bone density scan. This is done to screen for osteoporosis. You may have this done starting at age 65.  Mammogram. This may be done every 1-2 years. Talk to your health care provider about how often you should have regular mammograms. Talk with your health care provider about your test results, treatment options, and if necessary, the need for more tests. Vaccines  Your health care provider may recommend certain vaccines, such as:  Influenza vaccine.  This is recommended every year.  Tetanus, diphtheria, and acellular pertussis (Tdap, Td) vaccine. You may need a Td booster every 10 years.  Zoster vaccine. You may need this after age 71.  Pneumococcal 13-valent conjugate (PCV13) vaccine.  One dose is recommended after age 3.  Pneumococcal polysaccharide (PPSV23) vaccine. One dose is recommended after age 31. Talk to your health care provider about which screenings and vaccines you need and how often you need them. This information is not intended to replace advice given to you by your health care provider. Make sure you discuss any questions you have with your health care provider. Document Released: 01/15/2015 Document Revised: 09/08/2015 Document Reviewed: 10/20/2014 Elsevier Interactive Patient Education  2017 [place] Prevention in the Home Falls can cause injuries. They can happen to people of all ages. There are many things you can do to make your home safe and to help prevent falls. What can I do on the outside of my home?  Regularly fix the edges of walkways and driveways and fix any cracks.  Remove anything that might make you trip as you walk through a door, such as a raised step or threshold.  Trim any bushes or trees on the path to your home.  Use bright outdoor lighting.  Clear any walking paths of anything that might make someone trip, such as rocks or tools.  Regularly check to see if handrails are loose or broken. Make sure that both sides of any steps have handrails.  Any raised decks and porches should have guardrails on the edges.  Have any leaves, snow, or ice cleared regularly.  Use sand or salt on walking paths during winter.  Clean up any spills in your garage right away. This includes oil or grease spills. What can I do in the bathroom?  Use night lights.  Install grab bars by the toilet and in the tub and shower. Do not use towel bars as grab bars.  Use non-skid mats or decals in the tub or shower.  If you need to sit down in the shower, use a plastic, non-slip stool.  Keep the floor dry. Clean up any water that spills on the floor as soon as it happens.  Remove soap buildup in the tub or shower regularly.  Attach bath  mats securely with double-sided non-slip rug tape.  Do not have throw rugs and other things on the floor that can make you trip. What can I do in the bedroom?  Use night lights.  Make sure that you have a light by your bed that is easy to reach.  Do not use any sheets or blankets that are too big for your bed. They should not hang down onto the floor.  Have a firm chair that has side arms. You can use this for support while you get dressed.  Do not have throw rugs and other things on the floor that can make you trip. What can I do in the kitchen?  Clean up any spills right away.  Avoid walking on wet floors.  Keep items that you use a lot in easy-to-reach places.  If you need to reach something above you, use a strong step stool that has a grab bar.  Keep electrical cords out of the way.  Do not use floor polish or wax that makes floors slippery. If you must use wax, use non-skid floor wax.  Do not have throw rugs and other things on the floor that can make  you trip. What can I do with my stairs?  Do not leave any items on the stairs.  Make sure that there are handrails on both sides of the stairs and use them. Fix handrails that are broken or loose. Make sure that handrails are as long as the stairways.  Check any carpeting to make sure that it is firmly attached to the stairs. Fix any carpet that is loose or worn.  Avoid having throw rugs at the top or bottom of the stairs. If you do have throw rugs, attach them to the floor with carpet tape.  Make sure that you have a light switch at the top of the stairs and the bottom of the stairs. If you do not have them, ask someone to add them for you. What else can I do to help prevent falls?  Wear shoes that:  Do not have high heels.  Have rubber bottoms.  Are comfortable and fit you well.  Are closed at the toe. Do not wear sandals.  If you use a stepladder:  Make sure that it is fully opened. Do not climb a closed  stepladder.  Make sure that both sides of the stepladder are locked into place.  Ask someone to hold it for you, if possible.  Clearly mark and make sure that you can see:  Any grab bars or handrails.  First and last steps.  Where the edge of each step is.  Use tools that help you move around (mobility aids) if they are needed. These include:  Canes.  Walkers.  Scooters.  Crutches.  Turn on the lights when you go into a dark area. Replace any light bulbs as soon as they burn out.  Set up your furniture so you have a clear path. Avoid moving your furniture around.  If any of your floors are uneven, fix them.  If there are any pets around you, be aware of where they are.  Review your medicines with your doctor. Some medicines can make you feel dizzy. This can increase your chance of falling. Ask your doctor what other things that you can do to help prevent falls. This information is not intended to replace advice given to you by your health care provider. Make sure you discuss any questions you have with your health care provider. Document Released: 10/15/2008 Document Revised: 05/27/2015 Document Reviewed: 01/23/2014 Elsevier Interactive Patient Education  2017 [employer].

## 2016-09-25 ENCOUNTER — Other Ambulatory Visit: Payer: Self-pay | Admitting: Family Medicine

## 2016-09-25 DIAGNOSIS — F4310 Post-traumatic stress disorder, unspecified: Secondary | ICD-10-CM

## 2016-09-27 ENCOUNTER — Other Ambulatory Visit: Payer: Self-pay | Admitting: Family Medicine

## 2016-09-27 ENCOUNTER — Other Ambulatory Visit: Payer: Self-pay

## 2016-09-27 DIAGNOSIS — E669 Obesity, unspecified: Secondary | ICD-10-CM

## 2016-09-27 NOTE — Telephone Encounter (Signed)
Please call pt about contrave refill request [phone]

## 2016-09-28 MED ORDER — CONTRAVE 8-90 MG PO TB12: 2.0000 | ORAL_TABLET | Freq: Two times a day (BID) | ORAL | 2 refills | Status: DC

## 2016-09-28 NOTE — Telephone Encounter (Signed)
Called patient back today 9/27 at [place]. She reports that she used to be on Contrave per Amy Krebs FNP in 09/2015, she took it for several months did well, eventually stopped taking on own. She has gained some weight back, and still working on diet and some limited exercise, but has resumed it about 2 weeks ago, and would need a new refill to continue. She has a goal wt loss and plans to take it for about 3-6 months pending weight. We reviewed medicine briefly, and agreed to send 120 pills for 1 tab BID for 30 day supply +2 refills for total 3 months to [facility]. She states insurance does not pay on it, and cost was reportedly $114 per month. She also is asking about a mail order 3rd party "order online" source, for $99, she was considering this but does not know if it is a "scam" or not. I could not advise her further on this exact concern today, so we agree start with rx to pharmacy and check with them further.  Nobie Putnam, [facility] 09/28/2016, 1:12 PM

## 2016-10-02 NOTE — Telephone Encounter (Signed)
Received fax today, 10/1 pharmacy requests rx for separate rx Naltrexone and Buproprion instead of Contrave since cost is now >$300 by report.  I looked up these rx and they do not exist, at Naltrexone 8mg  and Buproprion 90mg . There are not similar options for this. We could consider only Buproprion but would not add the Naltrexone, smallest dose is 50mg .  I called [facility] to confirm, spoke with pharmacist and they do not know who made this recommendation, because they confirm these separate rx do not exist. They will call patient to notify her that this is not an option.  ----------------------------------------------- Also, please call patient back to notify her same, that we cannot rx both separate meds for Contrave, as they do not exist in those doses. She can try any of the following options:  1. Re-order actual Contrave and she would have to pay the higher cost  2. She may order Contrave from a 3rd party source online.  3. She may try Orlistat (weight loss medication over the counter).  4. Or we can review this in office in future to discuss other alternative options.  Kathy Morton, [facility] 10/02/2016, 6:00 PM

## 2016-10-05 NOTE — Telephone Encounter (Signed)
Pt states she was able to print coupon which reduce her co-pay from $300 to $190. She likes to discuss other option in future appointment.

## 2016-10-18 ENCOUNTER — Encounter: Payer: Self-pay | Admitting: Nurse Practitioner

## 2016-10-18 ENCOUNTER — Ambulatory Visit (INDEPENDENT_AMBULATORY_CARE_PROVIDER_SITE_OTHER): Payer: Medicare Other | Admitting: Nurse Practitioner

## 2016-10-18 VITALS — BP 145/69 | HR 75 | Temp 98.1°F | Ht 62.0 in | Wt 171.0 lb

## 2016-10-18 DIAGNOSIS — B001 Herpesviral vesicular dermatitis: Secondary | ICD-10-CM

## 2016-10-18 DIAGNOSIS — F4310 Post-traumatic stress disorder, unspecified: Secondary | ICD-10-CM | POA: Diagnosis not present

## 2016-10-18 DIAGNOSIS — I10 Essential (primary) hypertension: Secondary | ICD-10-CM

## 2016-10-18 DIAGNOSIS — E785 Hyperlipidemia, unspecified: Secondary | ICD-10-CM

## 2016-10-18 DIAGNOSIS — R799 Abnormal finding of blood chemistry, unspecified: Secondary | ICD-10-CM | POA: Diagnosis not present

## 2016-10-18 DIAGNOSIS — Z6831 Body mass index (BMI) 31.0-31.9, adult: Secondary | ICD-10-CM

## 2016-10-18 DIAGNOSIS — F5101 Primary insomnia: Secondary | ICD-10-CM

## 2016-10-18 DIAGNOSIS — E669 Obesity, unspecified: Secondary | ICD-10-CM

## 2016-10-18 MED ORDER — ATORVASTATIN CALCIUM 20 MG PO TABS: 20.0000 mg | ORAL_TABLET | Freq: Every day | ORAL | 3 refills | Status: DC

## 2016-10-18 MED ORDER — CLONAZEPAM 1 MG PO TABS: 1.0000 mg | ORAL_TABLET | Freq: Two times a day (BID) | ORAL | 2 refills | Status: DC | PRN

## 2016-10-18 MED ORDER — LIRAGLUTIDE -WEIGHT MANAGEMENT 18 MG/3ML ~~LOC~~ SOPN: 0.6000 mg | PEN_INJECTOR | Freq: Every day | SUBCUTANEOUS | 2 refills | Status: DC

## 2016-10-18 MED ORDER — LISINOPRIL 10 MG PO TABS: 10.0000 mg | ORAL_TABLET | Freq: Every day | ORAL | 3 refills | Status: DC

## 2016-10-18 MED ORDER — QUETIAPINE FUMARATE 200 MG PO TABS: 200.0000 mg | ORAL_TABLET | Freq: Every day | ORAL | 5 refills | Status: DC

## 2016-10-18 MED ORDER — VALACYCLOVIR HCL 1 G PO TABS
2000.0000 mg | ORAL_TABLET | Freq: Two times a day (BID) | ORAL | 1 refills | Status: DC
Start: 2016-10-18 — End: 2018-01-11

## 2016-10-18 NOTE — Patient Instructions (Addendum)
Kathy Morton, Thank you for coming in to clinic today.  1. For your weight loss, STOP contrave and START taking Saxenda - Inject once daily into skin. START w/ 0.6 mg once daily x 1 week. Then, inject 1.2 mg x 1 week. Then 1.8 mg for 1 week, then 2.4 mg for 1 week.   - If side effects, stay at the prior week's dose.  You will be due for [place].  This means you should eat no food or drink after midnight.  Drink only water or coffee without cream/sugar on the morning of your lab visit. - Please go ahead and schedule a "Lab Only" visit in the morning at the clinic for lab draw in the next 7 days. - Your results will be available about 2-3 days after blood draw.  If you have set up a MyChart account, you can can log in to MyChart online to view your results and a brief explanation. Also, we can discuss your results together at your next office visit if you would like.   2. For sleep and moods: increase seroquel to 200 mg once daily.  3. NO other changes to medications.  Continue at prior doses.  Please schedule a follow-up appointment with [name], AGNP. Return in about 4 weeks (around 11/15/2016) for weight loss.  If you have any other questions or concerns, please feel free to call the clinic or send a message through [place]. You may also schedule an earlier appointment if necessary.  You will receive a survey after today's visit either digitally by e-mail or paper by [employer]. Your experiences and feedback matter to [place].  Please respond so we know how we are doing as we provide care for you.   [name], DNP, AGNP-BC Adult Gerontology Nurse Practitioner [place]

## 2016-10-18 NOTE — Progress Notes (Signed)
Subjective:    Patient ID: Kathy Morton, female    DOB: 29-Aug-1945, 71 y.o.   MRN: [id number]  Kathy Morton is a 71 y.o. female presenting on 10/18/2016 for Anxiety; Depression; and Obesity (want discuss weigh management )   HPI PTSD w/ Anxiety and Depression Seroquel - taking for sleep, but states is not working anymore.  Has taken for a very long time.  Pt states she used to take a higher dose, but was decreased at some point in remote past.   Pt has episodes of panic and currently takes Clonazepam - 2 per day and sometimes none.  Very situational.  Pt describes anxiety attacks that "appear for no reason" and are very unpredictable.  Pt states she feels like a cloud comes over her, takes clonazepam, and in a few hours is ok.  Does not affect work.  Does not impact daily activities required, but does impact her desire to do things - mild anhedonia.  Cold sore outbreaks.   One sore present today - but is healing.  First appeared Thursday, took Valtrex 2 doses on Friday and has started to have resolution today.  Also used abreva.  Is now out of medication and needs a refill.   Cholesterol Atorvastatin 20 mg once daily and is tolerating well.  Started by Amy.  Pt asks if she needs to continue taking this medication.  Blood Pressure Elevated BP - lisinopril 10 mg once daily.  - She is not checking BP at home or outside of clinic.    - Current medications: lisinopril 10 mg once daily, tolerating well without side effects - She is not currently symptomatic. - Pt denies lightheadedness, dizziness, changes in vision, chest tightness/pressure, palpitations, leg swelling, sudden loss of speech or loss of consciousness. - She  reports no regular exercise routine. - Her diet is moderate in salt, moderate in fat, and moderate in carbohydrates.   Constipation history: Takes Miralax 3-4 x per week for regular BM q 1-2 days.  Has worked well and has no acute concerns w/ this  today.  Headaches History of Migraines, but has them much less frequently than 1 yr ago and only 1 per month.  When she does have a migraine, She has Imitrex which works well.  States she does not need refill at this time. - Has cut down on caffeine to 1-2 sodas per day from 2L per day. - Has daily headache - takes advil daily. If lingers, will take 1/2 tab imitrex.  Obesity Pt is wanting to discontinue contrave.  Is not working.  Pt states, " I am not eating and I'm still not losing."  Depression screen [facility] 2/9 10/18/2016 08/22/2016 02/01/2015 11/09/2014  Decreased Interest 0 0 0 0  Down, Depressed, Hopeless 1 1 0 0  PHQ - 2 Score 1 1 0 0  Altered sleeping 2 1 - -  Tired, decreased energy 2 1 - -  Change in appetite 2 1 - -  Feeling bad or failure about yourself  2 1 - -  Trouble concentrating 1 1 - -  Moving slowly or fidgety/restless 0 0 - -  Suicidal thoughts 0 0 - -  PHQ-9 Score 10 6 - -  Difficult doing work/chores Not difficult at all Not difficult at all - -   GAD 7 : Generalized Anxiety Score 10/18/2016 04/26/2015  Nervous, Anxious, on Edge 2 2  Control/stop worrying 1 2  Worry too much - different things 1 0  Trouble relaxing 1 1  Restless 1 0  Easily annoyed or irritable 1 2  Afraid - awful might happen 0 0  Total GAD 7 Score 7 7  Anxiety Difficulty Not difficult at all Somewhat difficult    Social History  Substance Use Topics  . Smoking status: Never Smoker  . Smokeless tobacco: Never Used  . Alcohol use No    Review of Systems Per HPI unless specifically indicated above     Objective:    BP (!) 145/69 (BP Location: Right Arm, Patient Position: Sitting, Cuff Size: Normal)   Pulse 75   Temp 98.1 F (36.7 C) (Oral)   Ht 5\' 2"  (1.575 m)   Wt 171 lb (77.6 kg)   BMI 31.28 kg/m   Wt Readings from Last 3 Encounters:  10/18/16 171 lb (77.6 kg)  08/22/16 174 lb 6.4 oz (79.1 kg)  04/17/16 165 lb (74.8 kg)    Physical Exam  General - overweight,  well-appearing, NAD HEENT - Normocephalic, atraumatic, PERRL, EOMI, patent nares w/o congestion, oropharynx clear, MMM Neck - supple, non-tender, no LAD, no carotid bruit Heart - RRR, no murmurs heard Lungs - Clear throughout all lobes, no wheezing, crackles, or rhonchi. Normal work of breathing. Abdomen - soft, NTND, no masses, no hepatosplenomegaly, active bowel sounds Extremeties - non-tender, no edema, cap refill < 2 seconds, peripheral pulses intact +2 bilaterally Skin - warm, dry, no rashes Neuro - awake, alert, oriented x3, normal gait Psych - Normal mood and affect, normal behavior   Results for orders placed or performed in visit on 10/18/16  Lipid panel  Result Value Ref Range   Cholesterol 130 <200 mg/dL   HDL 52 >50 mg/dL   Triglycerides 93 <150 mg/dL   LDL Cholesterol (Calc) 60 mg/dL (calc)   Total CHOL/HDL Ratio 2.5 <5.0 (calc)   Non-HDL Cholesterol (Calc) 78 <130 mg/dL (calc)  Comprehensive metabolic panel  Result Value Ref Range   Glucose, Bld 84 65 - 99 mg/dL   BUN 18 7 - 25 mg/dL   Creat 0.97 (H) 0.60 - 0.93 mg/dL   BUN/Creatinine Ratio 19 6 - 22 (calc)   Sodium 140 135 - 146 mmol/L   Potassium 4.0 3.5 - 5.3 mmol/L   Chloride 105 98 - 110 mmol/L   CO2 26 20 - 32 mmol/L   Calcium 10.8 (H) 8.6 - 10.4 mg/dL   Total Protein 6.7 6.1 - 8.1 g/dL   Albumin 4.3 3.6 - 5.1 g/dL   Globulin 2.4 1.9 - 3.7 g/dL (calc)   AG Ratio 1.8 1.0 - 2.5 (calc)   Total Bilirubin 0.4 0.2 - 1.2 mg/dL   Alkaline phosphatase (APISO) 89 33 - 130 U/L   AST 15 10 - 35 U/L   ALT 11 6 - 29 U/L  Hemoglobin A1c  Result Value Ref Range   Hgb A1c MFr Bld 5.4 <5.7 % of total Hgb   Mean Plasma Glucose 108 (calc)   eAG (mmol/L) 6.0 (calc)      Assessment & Plan:   Problem List Items Addressed This Visit      Cardiovascular and Mediastinum   Hypertension - Primary    Mildly elevated today, but stable on medication. Pt is taking lisinopril daily. Has no current symptoms or  complications.  Plan: 1. Pt encouraged to continue to take daily.  2. Regular BP check at local drug store to monitor. 3. Continue lisinopril 10 mg once daily.  Patient requests to stop taking medication and does not  want to increase dose. 4. Plan to recheck BP in 3 mos.        Relevant Medications   lisinopril (PRINIVIL,ZESTRIL) 10 MG tablet   atorvastatin (LIPITOR) 20 MG tablet   Other Relevant Orders   Comprehensive metabolic panel (Completed)     Digestive   Cold sore    Active cold sore today w/ improvement and near healing after taking valayclovir and using abreva.  Pt w/ hx of HSV cold sore eruptions.  Plan: 1. Continue Valtrex 1,000 mg bid x1 day prn cold sore eruption. 2. Follow up as needed.      Relevant Medications   valACYclovir (VALTREX) 1000 MG tablet     Other   PTSD (post-traumatic stress disorder)    Pt has PTSD w/associated panic disorder, anxiety and depression.  Moderate anxiety and depression w/ PHQ9 and GAD7 today.  Pt has prn clonepam w/ need for about 2 tablets per day.  She does report frequent panic attacks relieved by clonazepm.  Also takes Seroquel for sleep and is noting need for higher dose or treatmnet modification.  Plan: 1. INCREASE seroquel to 100 mg hs one tablet po once daily. 2. Encouraged good sleep hygiene. 3. Continue clonazepam 1 mg bid prn panic attack. 4. Follow up in 3 months assess treatment change and at least once every 6 mos.      Relevant Medications   clonazePAM (KLONOPIN) 1 MG tablet   Class 1 obesity without serious comorbidity with body mass index (BMI) of 31.0 to 31.9 in adult    Previously had 20lb weight loss with contrave. Now no longer successful for weight loss after taking a break form the medication.  Pt stil has weight goal of 150 lbs.    Plan:  1. Reviewed diet and lifestyle changes.   Incouraged pt to have at least 800 calories per day, btu may need up to 1200 calories for weight loss. 2. STOP contrave. 3.  START Saxenda.  Increase by 0.6 mg each week.  Inject 0.6 mg once daily and increase to 3.0 mg as tolerated.  Will need followup every 4 weeks to ensure is effective medication for weight loss.  Reviewed common side effects. .4 Follow up 4 weeks.      Relevant Medications   Liraglutide -Weight Management (SAXENDA) 18 MG/3ML SOPN   Other Relevant Orders   Hemoglobin A1c (Completed)   Hyperlipidemia    No recent lipid eval.  Pt taking statin med and tolerating well.  Requests to come off medication.  Plan: 1. Reviewed need for statin and showed positive response to med w/ last labs. 2. Rechecl lipid panel today. 3. Continue atorvastatin20 mg once daily. 4. Follow up every 6-12 mos.      Relevant Medications   lisinopril (PRINIVIL,ZESTRIL) 10 MG tablet   atorvastatin (LIPITOR) 20 MG tablet   Other Relevant Orders   Lipid panel (Completed)   Comprehensive metabolic panel (Completed)   Insomnia    See A/P PTSD. Increase Seroquel      Relevant Medications   QUEtiapine (SEROQUEL) 200 MG tablet          Meds ordered this encounter  Medications  . QUEtiapine (SEROQUEL) 200 MG tablet    Sig: Take 1 tablet (200 mg total) by mouth at bedtime.    Dispense:  30 tablet    Refill:  5    Order Specific Question:   Supervising Provider    Answer:   Olin Hauser [[id number]]  . DISCONTD: clonazePAM (  KLONOPIN) 1 MG tablet    Sig: Take 1 tablet (1 mg total) by mouth 2 (two) times daily as needed. for anxiety    Dispense:  60 tablet    Refill:  2    Order Specific Question:   Supervising Provider    Answer:   Olin Hauser [[id number]]  . valACYclovir (VALTREX) 1000 MG tablet    Sig: Take 2 tablets (2,000 mg total) by mouth every 12 (twelve) hours.    Dispense:  12 tablet    Refill:  1    Order Specific Question:   Supervising Provider    Answer:   Olin Hauser [[id number]]  . lisinopril (PRINIVIL,ZESTRIL) 10 MG tablet    Sig: Take 1 tablet (10 mg total) by mouth  daily.    Dispense:  90 tablet    Refill:  3    Order Specific Question:   Supervising Provider    Answer:   Olin Hauser [[id number]]  . atorvastatin (LIPITOR) 20 MG tablet    Sig: Take 1 tablet (20 mg total) by mouth daily.    Dispense:  90 tablet    Refill:  3    Order Specific Question:   Supervising Provider    Answer:   Olin Hauser [[id number]]  . Liraglutide -Weight Management (SAXENDA) 18 MG/3ML SOPN    Sig: Inject 0.6 mg into the skin daily. for 1 week.  Increase by 0.6 mg each week until your reach 3.0 mg daily.    Dispense:  5 pen    Refill:  2    Order Specific Question:   Supervising Provider    Answer:   Olin Hauser [[id number]]  . clonazePAM (KLONOPIN) 1 MG tablet    Sig: Take 1 tablet (1 mg total) by mouth 2 (two) times daily as needed. for anxiety    Dispense:  60 tablet    Refill:  2    Order Specific Question:   Supervising Provider    Answer:   Olin Hauser [[id number]]      Follow up plan: Return in about 4 weeks (around 11/15/2016) for weight loss.  A total of 40 minutes was spent face-to-face with this patient. Greater than 50% of this time was spent in counseling and coordination of care with the patient.   [name], DNP, AGPCNP-BC Adult Gerontology Primary Care Nurse Practitioner [facility] 10/25/2016, 10:55 AM

## 2016-10-21 LAB — COMPREHENSIVE METABOLIC PANEL
AG Ratio: 1.8 (calc) (ref 1.0–2.5)
ALT: 11 U/L (ref 6–29)
AST: 15 U/L (ref 10–35)
Albumin: 4.3 g/dL (ref 3.6–5.1)
Alkaline phosphatase (APISO): 89 U/L (ref 33–130)
BUN/Creatinine Ratio: 19 (calc) (ref 6–22)
BUN: 18 mg/dL (ref 7–25)
CO2: 26 mmol/L (ref 20–32)
Calcium: 10.8 mg/dL — ABNORMAL HIGH (ref 8.6–10.4)
Chloride: 105 mmol/L (ref 98–110)
Creat: 0.97 mg/dL — ABNORMAL HIGH (ref 0.60–0.93)
Globulin: 2.4 g/dL (calc) (ref 1.9–3.7)
Glucose, Bld: 84 mg/dL (ref 65–99)
Potassium: 4 mmol/L (ref 3.5–5.3)
Sodium: 140 mmol/L (ref 135–146)
Total Bilirubin: 0.4 mg/dL (ref 0.2–1.2)
Total Protein: 6.7 g/dL (ref 6.1–8.1)

## 2016-10-21 LAB — LIPID PANEL
Cholesterol: 130 mg/dL (ref <200)
HDL: 52 mg/dL (ref >50)
LDL Cholesterol (Calc): 60 mg/dL (calc)
Non-HDL Cholesterol (Calc): 78 mg/dL (calc) (ref <130)
Total CHOL/HDL Ratio: 2.5 (calc) (ref <5.0)
Triglycerides: 93 mg/dL (ref <150)

## 2016-10-21 LAB — HEMOGLOBIN A1C
Hgb A1c MFr Bld: 5.4 % of total Hgb (ref <5.7)
Mean Plasma Glucose: 108 (calc)
eAG (mmol/L): 6 (calc)

## 2016-10-25 DIAGNOSIS — B001 Herpesviral vesicular dermatitis: Secondary | ICD-10-CM | POA: Insufficient documentation

## 2016-10-25 NOTE — Assessment & Plan Note (Signed)
Previously had 20lb weight loss with contrave. Now no longer successful for weight loss after taking a break form the medication.  Pt stil has weight goal of 150 lbs.    Plan:  1. Reviewed diet and lifestyle changes.   Incouraged pt to have at least 800 calories per day, btu may need up to 1200 calories for weight loss. 2. STOP contrave. 3. START Saxenda.  Increase by 0.6 mg each week.  Inject 0.6 mg once daily and increase to 3.0 mg as tolerated.  Will need followup every 4 weeks to ensure is effective medication for weight loss.  Reviewed common side effects. .4 Follow up 4 weeks.

## 2016-10-25 NOTE — Assessment & Plan Note (Signed)
Pt has PTSD w/associated panic disorder, anxiety and depression.  Moderate anxiety and depression w/ PHQ9 and GAD7 today.  Pt has prn clonepam w/ need for about 2 tablets per day.  She does report frequent panic attacks relieved by clonazepm.  Also takes Seroquel for sleep and is noting need for higher dose or treatmnet modification.  Plan: 1. INCREASE seroquel to 100 mg hs one tablet po once daily. 2. Encouraged good sleep hygiene. 3. Continue clonazepam 1 mg bid prn panic attack. 4. Follow up in 3 months assess treatment change and at least once every 6 mos.

## 2016-10-25 NOTE — Assessment & Plan Note (Signed)
See A/P PTSD. Increase Seroquel

## 2016-10-25 NOTE — Assessment & Plan Note (Addendum)
Mildly elevated today, but stable on medication. Pt is taking lisinopril daily. Has no current symptoms or complications.  Plan: 1. Pt encouraged to continue to take daily.  2. Regular BP check at local drug store to monitor. 3. Continue lisinopril 10 mg once daily.  Patient requests to stop taking medication and does not want to increase dose. 4. Plan to recheck BP in 3 mos.

## 2016-10-25 NOTE — Assessment & Plan Note (Signed)
No recent lipid eval.  Pt taking statin med and tolerating well.  Requests to come off medication.  Plan: 1. Reviewed need for statin and showed positive response to med w/ last labs. 2. Rechecl lipid panel today. 3. Continue atorvastatin20 mg once daily. 4. Follow up every 6-12 mos.

## 2016-10-25 NOTE — Assessment & Plan Note (Addendum)
Active cold sore today w/ improvement and near healing after taking valayclovir and using abreva.  Pt w/ hx of HSV cold sore eruptions.  Plan: 1. Continue Valtrex 1,000 mg bid x1 day prn cold sore eruption. 2. Follow up as needed.

## 2016-11-01 ENCOUNTER — Other Ambulatory Visit: Payer: Self-pay

## 2016-12-12 ENCOUNTER — Other Ambulatory Visit: Payer: Self-pay

## 2016-12-12 DIAGNOSIS — F5101 Primary insomnia: Secondary | ICD-10-CM

## 2016-12-13 MED ORDER — QUETIAPINE FUMARATE 200 MG PO TABS: 200.0000 mg | ORAL_TABLET | Freq: Every day | ORAL | 1 refills | Status: DC

## 2017-01-30 ENCOUNTER — Other Ambulatory Visit: Payer: Self-pay

## 2017-01-30 DIAGNOSIS — G43709 Chronic migraine without aura, not intractable, without status migrainosus: Secondary | ICD-10-CM

## 2017-01-30 MED ORDER — SUMATRIPTAN SUCCINATE 100 MG PO TABS: ORAL_TABLET | ORAL | 11 refills | Status: DC

## 2017-03-12 ENCOUNTER — Other Ambulatory Visit: Payer: Self-pay | Admitting: Nurse Practitioner

## 2017-03-12 DIAGNOSIS — F4310 Post-traumatic stress disorder, unspecified: Secondary | ICD-10-CM

## 2017-04-23 ENCOUNTER — Other Ambulatory Visit: Payer: Self-pay | Admitting: Nurse Practitioner

## 2017-04-23 DIAGNOSIS — F4310 Post-traumatic stress disorder, unspecified: Secondary | ICD-10-CM

## 2017-04-24 NOTE — Telephone Encounter (Signed)
Left a message on pt vm to return my call. The pt needs to be scheduled for a f/u visit to get her clonazepam refilled.

## 2017-04-25 NOTE — Telephone Encounter (Signed)
The pt was notified that she needs to come in the office to be seen in order to get a refill on her clonazepam. She verbalize understanding, no questions or concerns. Appt scheduled.

## 2017-05-14 ENCOUNTER — Other Ambulatory Visit: Payer: Self-pay

## 2017-05-14 ENCOUNTER — Ambulatory Visit: Payer: Medicare Other | Admitting: Nurse Practitioner

## 2017-05-14 ENCOUNTER — Encounter: Payer: Self-pay | Admitting: Nurse Practitioner

## 2017-05-14 VITALS — BP 125/66 | HR 90 | Temp 98.4°F | Ht 62.0 in | Wt 183.0 lb

## 2017-05-14 DIAGNOSIS — G43709 Chronic migraine without aura, not intractable, without status migrainosus: Secondary | ICD-10-CM

## 2017-05-14 DIAGNOSIS — Z6833 Body mass index (BMI) 33.0-33.9, adult: Secondary | ICD-10-CM | POA: Diagnosis not present

## 2017-05-14 DIAGNOSIS — F4310 Post-traumatic stress disorder, unspecified: Secondary | ICD-10-CM

## 2017-05-14 DIAGNOSIS — F5104 Psychophysiologic insomnia: Secondary | ICD-10-CM | POA: Diagnosis not present

## 2017-05-14 DIAGNOSIS — E6609 Other obesity due to excess calories: Secondary | ICD-10-CM

## 2017-05-14 DIAGNOSIS — F5081 Binge eating disorder: Secondary | ICD-10-CM | POA: Diagnosis not present

## 2017-05-14 MED ORDER — TOPIRAMATE 25 MG PO TABS: 25.0000 mg | ORAL_TABLET | Freq: Two times a day (BID) | ORAL | 2 refills | Status: DC

## 2017-05-14 MED ORDER — CLONAZEPAM 1 MG PO TABS
ORAL_TABLET | ORAL | 2 refills | Status: DC
Start: 2017-05-14 — End: 2017-08-13

## 2017-05-14 NOTE — Progress Notes (Signed)
Subjective:    Patient ID: Kathy Morton, female    DOB: 1945/08/13, 72 y.o.   MRN: [id number]  Kathy Morton is a 72 y.o. female presenting on 05/14/2017 for Anxiety (medication refill)   HPI   Insomnia: seroquel use allows pt to go to sleep well.  She only takes intermittently prn.  Usually wakes shortly after onset of sleep.  Then will be awake for hours and no sleep, but then sleeps until 10 am.   Anxiety Takes one clonazepam when she feels the feeling come over me.  Feels like there is a cloud that comes over her and rains.  No predictable time. Takes only one daily as needed, but does not need this every day.  Weight "Food is the only friend I have." Cooks the right things, but eats too much.  Being overweight "lowers my energy level."  Pt also admits she has "no willpower" over food to stop eating.  Headache Migraine headache about 2-3 days per week also.  Take sumatriptan with each headache and provides relief.  Pt is not currently on preventative therapy and has not tried any prevention in past.  Depression screen [facility] 2/9 05/14/2017 10/18/2016 08/22/2016 02/01/2015 11/09/2014  Decreased Interest 0 0 0 0 0  Down, Depressed, Hopeless 0 1 1 0 0  PHQ - 2 Score 0 1 1 0 0  Altered sleeping 2 2 1  - -  Tired, decreased energy 3 2 1  - -  Change in appetite 3 2 1  - -  Feeling bad or failure about yourself  0 2 1 - -  Trouble concentrating 0 1 1 - -  Moving slowly or fidgety/restless 0 0 0 - -  Suicidal thoughts 0 0 0 - -  PHQ-9 Score 8 10 6  - -  Difficult doing work/chores Not difficult at all Not difficult at all Not difficult at all - -      Social History   Tobacco Use  . Smoking status: Never Smoker  . Smokeless tobacco: Never Used  Substance Use Topics  . Alcohol use: No  . Drug use: No    Review of Systems Per HPI unless specifically indicated above     Objective:    BP 125/66 (BP Location: Right Arm, Patient Position: Sitting, Cuff Size: Normal)   Pulse  90   Temp 98.4 F (36.9 C) (Oral)   Ht 5\' 2"  (1.575 m)   Wt 183 lb (83 kg)   BMI 33.47 kg/m   Wt Readings from Last 3 Encounters:  05/14/17 183 lb (83 kg)  10/18/16 171 lb (77.6 kg)  08/22/16 174 lb 6.4 oz (79.1 kg)    Physical Exam  Constitutional: She is oriented to person, place, and time. She appears well-developed and well-nourished. No distress.  HENT:  Head: Normocephalic and atraumatic.  Right Ear: External ear normal.  Left Ear: External ear normal.  Nose: Nose normal.  Mouth/Throat: Oropharynx is clear and moist.  Eyes: Pupils are equal, round, and reactive to light. Conjunctivae are normal.  Neck: Normal range of motion. Neck supple. No JVD present. No tracheal deviation present. No thyromegaly present.  Cardiovascular: Normal rate, regular rhythm, normal heart sounds and intact distal pulses. Exam reveals no gallop and no friction rub.  No murmur heard. Pulmonary/Chest: Effort normal and breath sounds normal. No respiratory distress.  Abdominal: Soft. Bowel sounds are normal. She exhibits no distension. There is no tenderness.  Musculoskeletal: Normal range of motion.  Lymphadenopathy:  She has no cervical adenopathy.  Neurological: She is alert and oriented to person, place, and time. No cranial nerve deficit.  Skin: Skin is warm and dry.  Psychiatric: Judgment and thought content normal. She is withdrawn. She exhibits a depressed mood (mildly).  Nursing note and vitals reviewed.    Results for orders placed or performed in visit on 10/18/16  Lipid panel  Result Value Ref Range   Cholesterol 130 <200 mg/dL   HDL 52 >50 mg/dL   Triglycerides 93 <150 mg/dL   LDL Cholesterol (Calc) 60 mg/dL (calc)   Total CHOL/HDL Ratio 2.5 <5.0 (calc)   Non-HDL Cholesterol (Calc) 78 <130 mg/dL (calc)  Comprehensive metabolic panel  Result Value Ref Range   Glucose, Bld 84 65 - 99 mg/dL   BUN 18 7 - 25 mg/dL   Creat 0.97 (H) 0.60 - 0.93 mg/dL   BUN/Creatinine Ratio 19 6 -  22 (calc)   Sodium 140 135 - 146 mmol/L   Potassium 4.0 3.5 - 5.3 mmol/L   Chloride 105 98 - 110 mmol/L   CO2 26 20 - 32 mmol/L   Calcium 10.8 (H) 8.6 - 10.4 mg/dL   Total Protein 6.7 6.1 - 8.1 g/dL   Albumin 4.3 3.6 - 5.1 g/dL   Globulin 2.4 1.9 - 3.7 g/dL (calc)   AG Ratio 1.8 1.0 - 2.5 (calc)   Total Bilirubin 0.4 0.2 - 1.2 mg/dL   Alkaline phosphatase (APISO) 89 33 - 130 U/L   AST 15 10 - 35 U/L   ALT 11 6 - 29 U/L  Hemoglobin A1c  Result Value Ref Range   Hgb A1c MFr Bld 5.4 <5.7 % of total Hgb   Mean Plasma Glucose 108 (calc)   eAG (mmol/L) 6.0 (calc)      Assessment & Plan:   Problem List Items Addressed This Visit      Cardiovascular and Mediastinum   Migraines    Stable, but with 10-15 headache days per month.  Responsive to sumatriptan, but frequency of headaches is not helping pt with depression. - Start topiramate as below for binge eating - may also provide headache prophylaxis.      Relevant Medications   clonazePAM (KLONOPIN) 1 MG tablet     Other   PTSD (post-traumatic stress disorder)    Pt has PTSD w/associated panic disorder, anxiety and depression.  Moderate anxiety and depression w/ PHQ9 and GAD7 today.  Pt has prn clonepam w/ need for about 2 tablets per day.  She does report frequent panic attacks relieved by clonazepm.  Also takes Seroquel for sleep and is noting need for higher dose or treatmnet modification.  Plan: 1. CONTINUE seroquel 100 mg hs one tablet po once daily.  Take drug holiday x 5-7 days for improving effectiveness with sleep. 2. Encouraged good sleep hygiene. 3. Continue clonazepam 1 mg daily prn panic attack. 4. Follow up in 3 months assess treatment change and at least once every 6 mos.      Relevant Medications   clonazePAM (KLONOPIN) 1 MG tablet   Class 1 obesity due to excess calories with body mass index (BMI) of 33.0 to 33.9 in adult    Worsening.  Pt still has weight goal of 150 lbs, but has been unable to exhibit dietary  control for appropriate food intake that would support being on pharmacological therapy for weight loss.  Plan:  1. Reviewed diet and lifestyle changes.   Continue to encourage healthy diet. 2. STOP all direct  pharmacotherapy for weight loss - START topiramate 25 mg once daily for binge eating. 3. Encouraged mindful eating strategies.  4. Consider therapy or counseling for non-pharm management of mental health and eating.j 5. Follow up 3 months.      Insomnia    See PTSD      Binge eating disorder - Primary    Binge eating disorder is present with grazing presentation.  Pt continues eating throughout afternoon/evening without any hunger cues and to provide emotional relief of depression/anxiety.  Treat as above in BMI.         Meds ordered this encounter  Medications  . DISCONTD: topiramate (TOPAMAX) 25 MG tablet    Sig: Take 1 tablet (25 mg total) by mouth 2 (two) times daily.    Dispense:  30 tablet    Refill:  2    Order Specific Question:   Supervising Provider    Answer:   Olin Hauser [[id number]]  . clonazePAM (KLONOPIN) 1 MG tablet    Sig: TAKE 1 TABLET BY MOUTH ONCE A DAY AS NEEDED FOR ANXIETY    Dispense:  30 tablet    Refill:  2    Pt must have appointment before any additional refills after 04/16/2017.    Order Specific Question:   Supervising Provider    Answer:   Olin Hauser [[id number]]    Follow up plan: Return in about 3 months (around 08/14/2017) for weight loss and anxiety.  [name], DNP, AGPCNP-BC Adult Gerontology Primary Care Nurse Practitioner [facility] 06/01/2017, 3:34 PM

## 2017-05-14 NOTE — Patient Instructions (Addendum)
Kathy Morton,   Thank you for coming in to clinic today.  1. START topiramate 25 mg once daily for weight loss and treatment of your grazing for eating habits - a form of binge eating.  2. Continue clonazepam once daily as needed.  3. Take a drug holiday from your seroquel.  STOP taking for 7-10 days before resuming.  Please schedule a follow-up appointment with [name], AGNP. Return in about 3 months (around 08/14/2017) for weight loss and anxiety.  If you have any other questions or concerns, please feel free to call the clinic or send a message through [place]. You may also schedule an earlier appointment if necessary.  You will receive a survey after today's visit either digitally by e-mail or paper by [employer]. Your experiences and feedback matter to [place].  Please respond so we know how we are doing as we provide care for you.   [name], DNP, AGNP-BC Adult Gerontology Nurse Practitioner [place]

## 2017-05-16 ENCOUNTER — Other Ambulatory Visit: Payer: Self-pay | Admitting: Nurse Practitioner

## 2017-05-16 DIAGNOSIS — F5081 Binge eating disorder: Secondary | ICD-10-CM

## 2017-05-16 MED ORDER — TOPIRAMATE 25 MG PO TABS: 25.0000 mg | ORAL_TABLET | Freq: Every day | ORAL | 2 refills | Status: DC

## 2017-05-16 NOTE — Progress Notes (Signed)
Pt called with medication instruction discrepancy.  Topiramate 25 mg once daily was discussed at visit.  Pt received 30 tablets as she should have.  Instruction stated take 1 tab twice daily.  Prescription corrected.  Pt instructions given over phone for 1 tab daily.  Pt verbalizes understanding.

## 2017-06-01 ENCOUNTER — Encounter: Payer: Self-pay | Admitting: Nurse Practitioner

## 2017-06-01 DIAGNOSIS — F5081 Binge eating disorder: Secondary | ICD-10-CM | POA: Insufficient documentation

## 2017-06-01 NOTE — Assessment & Plan Note (Signed)
Stable, but with 10-15 headache days per month.  Responsive to sumatriptan, but frequency of headaches is not helping pt with depression. - Start topiramate as below for binge eating - may also provide headache prophylaxis.

## 2017-06-01 NOTE — Assessment & Plan Note (Signed)
-   See PTSD

## 2017-06-01 NOTE — Assessment & Plan Note (Addendum)
Worsening.  Pt still has weight goal of 150 lbs, but has been unable to exhibit dietary control for appropriate food intake that would support being on pharmacological therapy for weight loss.  Plan:  1. Reviewed diet and lifestyle changes.   Continue to encourage healthy diet. 2. STOP all direct pharmacotherapy for weight loss - START topiramate 25 mg once daily for binge eating. 3. Encouraged mindful eating strategies.  4. Consider therapy or counseling for non-pharm management of mental health and eating.j 5. Follow up 3 months.

## 2017-06-01 NOTE — Assessment & Plan Note (Signed)
Pt has PTSD w/associated panic disorder, anxiety and depression.  Moderate anxiety and depression w/ PHQ9 and GAD7 today.  Pt has prn clonepam w/ need for about 2 tablets per day.  She does report frequent panic attacks relieved by clonazepm.  Also takes Seroquel for sleep and is noting need for higher dose or treatmnet modification.  Plan: 1. CONTINUE seroquel 100 mg hs one tablet po once daily.  Take drug holiday x 5-7 days for improving effectiveness with sleep. 2. Encouraged good sleep hygiene. 3. Continue clonazepam 1 mg daily prn panic attack. 4. Follow up in 3 months assess treatment change and at least once every 6 mos.

## 2017-06-01 NOTE — Assessment & Plan Note (Signed)
Binge eating disorder is present with grazing presentation.  Pt continues eating throughout afternoon/evening without any hunger cues and to provide emotional relief of depression/anxiety.  Treat as above in BMI.

## 2017-06-07 ENCOUNTER — Other Ambulatory Visit: Payer: Self-pay | Admitting: Family Medicine

## 2017-06-07 DIAGNOSIS — F5101 Primary insomnia: Secondary | ICD-10-CM

## 2017-06-09 ENCOUNTER — Other Ambulatory Visit: Payer: Self-pay | Admitting: Nurse Practitioner

## 2017-06-09 DIAGNOSIS — F5081 Binge eating disorder: Secondary | ICD-10-CM

## 2017-08-13 ENCOUNTER — Other Ambulatory Visit: Payer: Self-pay | Admitting: Family Medicine

## 2017-08-13 ENCOUNTER — Ambulatory Visit: Payer: Medicare Other | Admitting: Family Medicine

## 2017-08-13 ENCOUNTER — Encounter: Payer: Self-pay | Admitting: Family Medicine

## 2017-08-13 VITALS — BP 123/63 | HR 86 | Temp 98.6°F | Resp 16 | Ht 62.0 in | Wt 173.0 lb

## 2017-08-13 DIAGNOSIS — F4310 Post-traumatic stress disorder, unspecified: Secondary | ICD-10-CM

## 2017-08-13 DIAGNOSIS — Z0000 Encounter for general adult medical examination without abnormal findings: Secondary | ICD-10-CM

## 2017-08-13 MED ORDER — CLONAZEPAM 1 MG PO TABS: ORAL_TABLET | ORAL | 0 refills | Status: DC

## 2017-08-13 NOTE — Patient Instructions (Signed)
Rescheduled visit.

## 2017-08-13 NOTE — Progress Notes (Signed)
Patient was not evaluated or examined today.  Patient had been previously seen by [name], AGPCNP-BC back in 10/2016 and 05/2017. She expected to see Lauren today as her provider, and was confused that this was not changed in the system.  I apologized for inconvenience and the scheduling issue.  Lauren's schedule is booked today and she does not wish to have this visit with me today. She cannot return for at least 1-2 weeks.  She is out of her Clonazepam and requested a temporary refill. She will send a refill request for this. I advised her that upon request we could not refill for up to 3 months now, and I would defer further dose adjustments to her new PCP.  This visit was cancelled and she will re-schedule.  Nobie Putnam, [facility] 08/13/2017, 1:51 PM

## 2017-08-15 ENCOUNTER — Telehealth: Payer: Self-pay | Admitting: Nurse Practitioner

## 2017-08-15 NOTE — Telephone Encounter (Signed)
Spoke to pt she was in the car and said she will c/b  Sched for AWV last AWV 08/22/16 Thank you! Jill Alexanders [phone]

## 2017-08-27 ENCOUNTER — Ambulatory Visit: Payer: Medicare Other | Admitting: Nurse Practitioner

## 2017-09-10 ENCOUNTER — Ambulatory Visit: Payer: Medicare Other | Admitting: Nurse Practitioner

## 2017-09-18 ENCOUNTER — Ambulatory Visit (INDEPENDENT_AMBULATORY_CARE_PROVIDER_SITE_OTHER): Payer: Medicare Other

## 2017-09-18 ENCOUNTER — Ambulatory Visit: Payer: Medicare Other

## 2017-09-18 ENCOUNTER — Ambulatory Visit (INDEPENDENT_AMBULATORY_CARE_PROVIDER_SITE_OTHER): Payer: Medicare Other | Admitting: Nurse Practitioner

## 2017-09-18 ENCOUNTER — Encounter: Payer: Self-pay | Admitting: Nurse Practitioner

## 2017-09-18 VITALS — BP 120/74 | HR 78 | Temp 98.3°F | Resp 16 | Ht 62.0 in | Wt 171.2 lb

## 2017-09-18 DIAGNOSIS — Z1239 Encounter for other screening for malignant neoplasm of breast: Secondary | ICD-10-CM

## 2017-09-18 DIAGNOSIS — E785 Hyperlipidemia, unspecified: Secondary | ICD-10-CM | POA: Diagnosis not present

## 2017-09-18 DIAGNOSIS — F5101 Primary insomnia: Secondary | ICD-10-CM | POA: Diagnosis not present

## 2017-09-18 DIAGNOSIS — F4310 Post-traumatic stress disorder, unspecified: Secondary | ICD-10-CM

## 2017-09-18 DIAGNOSIS — Z0000 Encounter for general adult medical examination without abnormal findings: Secondary | ICD-10-CM

## 2017-09-18 DIAGNOSIS — Z1231 Encounter for screening mammogram for malignant neoplasm of breast: Secondary | ICD-10-CM | POA: Diagnosis not present

## 2017-09-18 DIAGNOSIS — F5081 Binge eating disorder: Secondary | ICD-10-CM

## 2017-09-18 MED ORDER — TOPIRAMATE 50 MG PO TABS: 50.0000 mg | ORAL_TABLET | Freq: Every day | ORAL | 1 refills | Status: DC

## 2017-09-18 MED ORDER — CLONAZEPAM 1 MG PO TABS: ORAL_TABLET | ORAL | 3 refills | Status: DC

## 2017-09-18 MED ORDER — ATORVASTATIN CALCIUM 20 MG PO TABS: 20.0000 mg | ORAL_TABLET | Freq: Every day | ORAL | 3 refills | Status: DC

## 2017-09-18 MED ORDER — QUETIAPINE FUMARATE 100 MG PO TABS: 200.0000 mg | ORAL_TABLET | Freq: Every day | ORAL | 3 refills | Status: DC

## 2017-09-18 NOTE — Patient Instructions (Signed)
Kathy Morton , Thank you for taking time to come for your Medicare Wellness Visit. I appreciate your ongoing commitment to your health goals. Please review the following plan we discussed and let me know if I can assist you in the future.   Screening recommendations/referrals: Colonoscopy: completed 11/29/2015 Mammogram: Please call [phone] to schedule your mammogram.  Bone Density: completed 04/17/2016 Recommended yearly ophthalmology/optometry visit for glaucoma screening and checkup Recommended yearly dental visit for hygiene and checkup  Vaccinations: Influenza vaccine: due now, will be available at [facility] 10/02/17 Pneumococcal vaccine: completed series TDAP: up to date Shingles vaccine: shingrix eligible, check with your insurance company for coverage   Advanced directives:Please bring a copy of your health care power of attorney and living will to the office at your convenience.  Conditions/risks identified: recommend drinking at least 6-8 glasses of water a day   Next appointment: Follow up in one year for your annual wellness exam.    Preventive Care 65 Years and Older, Female Preventive care refers to lifestyle choices and visits with your health care provider that can promote health and wellness. What does preventive care include?  A yearly physical exam. This is also called an annual well check.  Dental exams once or twice a year.  Routine eye exams. Ask your health care provider how often you should have your eyes checked.  Personal lifestyle choices, including:  Daily care of your teeth and gums.  Regular physical activity.  Eating a healthy diet.  Avoiding tobacco and drug use.  Limiting alcohol use.  Practicing safe sex.  Taking low-dose aspirin every day.  Taking vitamin and mineral supplements as recommended by your health care provider. What happens during an annual well check? The services and screenings done by your health care provider during  your annual well check will depend on your age, overall health, lifestyle risk factors, and family history of disease. Counseling  Your health care provider may ask you questions about your:  Alcohol use.  Tobacco use.  Drug use.  Emotional well-being.  Home and relationship well-being.  Sexual activity.  Eating habits.  History of falls.  Memory and ability to understand (cognition).  Work and work Statistician.  Reproductive health. Screening  You may have the following tests or measurements:  Height, weight, and BMI.  Blood pressure.  Lipid and cholesterol levels. These may be checked every 5 years, or more frequently if you are over 39 years old.  Skin check.  Lung cancer screening. You may have this screening every year starting at age 55 if you have a 30-pack-year history of smoking and currently smoke or have quit within the past 15 years.  Fecal occult blood test (FOBT) of the stool. You may have this test every year starting at age 19.  Flexible sigmoidoscopy or colonoscopy. You may have a sigmoidoscopy every 5 years or a colonoscopy every 10 years starting at age 103.  Hepatitis C blood test.  Hepatitis B blood test.  Sexually transmitted disease (STD) testing.  Diabetes screening. This is done by checking your blood sugar (glucose) after you have not eaten for a while (fasting). You may have this done every 1-3 years.  Bone density scan. This is done to screen for osteoporosis. You may have this done starting at age 28.  Mammogram. This may be done every 1-2 years. Talk to your health care provider about how often you should have regular mammograms. Talk with your health care provider about your test results, treatment options,  and if necessary, the need for more tests. Vaccines  Your health care provider may recommend certain vaccines, such as:  Influenza vaccine. This is recommended every year.  Tetanus, diphtheria, and acellular pertussis (Tdap,  Td) vaccine. You may need a Td booster every 10 years.  Zoster vaccine. You may need this after age 8.  Pneumococcal 13-valent conjugate (PCV13) vaccine. One dose is recommended after age 22.  Pneumococcal polysaccharide (PPSV23) vaccine. One dose is recommended after age 57. Talk to your health care provider about which screenings and vaccines you need and how often you need them. This information is not intended to replace advice given to you by your health care provider. Make sure you discuss any questions you have with your health care provider. Document Released: 01/15/2015 Document Revised: 09/08/2015 Document Reviewed: 10/20/2014 Elsevier Interactive Patient Education  2017 [place] Prevention in the Home Falls can cause injuries. They can happen to people of all ages. There are many things you can do to make your home safe and to help prevent falls. What can I do on the outside of my home?  Regularly fix the edges of walkways and driveways and fix any cracks.  Remove anything that might make you trip as you walk through a door, such as a raised step or threshold.  Trim any bushes or trees on the path to your home.  Use bright outdoor lighting.  Clear any walking paths of anything that might make someone trip, such as rocks or tools.  Regularly check to see if handrails are loose or broken. Make sure that both sides of any steps have handrails.  Any raised decks and porches should have guardrails on the edges.  Have any leaves, snow, or ice cleared regularly.  Use sand or salt on walking paths during winter.  Clean up any spills in your garage right away. This includes oil or grease spills. What can I do in the bathroom?  Use night lights.  Install grab bars by the toilet and in the tub and shower. Do not use towel bars as grab bars.  Use non-skid mats or decals in the tub or shower.  If you need to sit down in the shower, use a plastic, non-slip  stool.  Keep the floor dry. Clean up any water that spills on the floor as soon as it happens.  Remove soap buildup in the tub or shower regularly.  Attach bath mats securely with double-sided non-slip rug tape.  Do not have throw rugs and other things on the floor that can make you trip. What can I do in the bedroom?  Use night lights.  Make sure that you have a light by your bed that is easy to reach.  Do not use any sheets or blankets that are too big for your bed. They should not hang down onto the floor.  Have a firm chair that has side arms. You can use this for support while you get dressed.  Do not have throw rugs and other things on the floor that can make you trip. What can I do in the kitchen?  Clean up any spills right away.  Avoid walking on wet floors.  Keep items that you use a lot in easy-to-reach places.  If you need to reach something above you, use a strong step stool that has a grab bar.  Keep electrical cords out of the way.  Do not use floor polish or wax that makes floors slippery. If  you must use wax, use non-skid floor wax.  Do not have throw rugs and other things on the floor that can make you trip. What can I do with my stairs?  Do not leave any items on the stairs.  Make sure that there are handrails on both sides of the stairs and use them. Fix handrails that are broken or loose. Make sure that handrails are as long as the stairways.  Check any carpeting to make sure that it is firmly attached to the stairs. Fix any carpet that is loose or worn.  Avoid having throw rugs at the top or bottom of the stairs. If you do have throw rugs, attach them to the floor with carpet tape.  Make sure that you have a light switch at the top of the stairs and the bottom of the stairs. If you do not have them, ask someone to add them for you. What else can I do to help prevent falls?  Wear shoes that:  Do not have high heels.  Have rubber bottoms.  Are  comfortable and fit you well.  Are closed at the toe. Do not wear sandals.  If you use a stepladder:  Make sure that it is fully opened. Do not climb a closed stepladder.  Make sure that both sides of the stepladder are locked into place.  Ask someone to hold it for you, if possible.  Clearly mark and make sure that you can see:  Any grab bars or handrails.  First and last steps.  Where the edge of each step is.  Use tools that help you move around (mobility aids) if they are needed. These include:  Canes.  Walkers.  Scooters.  Crutches.  Turn on the lights when you go into a dark area. Replace any light bulbs as soon as they burn out.  Set up your furniture so you have a clear path. Avoid moving your furniture around.  If any of your floors are uneven, fix them.  If there are any pets around you, be aware of where they are.  Review your medicines with your doctor. Some medicines can make you feel dizzy. This can increase your chance of falling. Ask your doctor what other things that you can do to help prevent falls. This information is not intended to replace advice given to you by your health care provider. Make sure you discuss any questions you have with your health care provider. Document Released: 10/15/2008 Document Revised: 05/27/2015 Document Reviewed: 01/23/2014 Elsevier Interactive Patient Education  2017 [employer].

## 2017-09-18 NOTE — Patient Instructions (Addendum)
Kathy Morton,   Thank you for coming in to clinic today.  1. Continue Seroquel 100 mg about 5-6 days per week.  2. Continue clonazepam.  Try 1/2 tablet occasionally.  3. INCREASE topiramate to 50 mg at bedtime.  4. You will be due for FASTING BLOOD WORK.  This means you should eat no food or drink after midnight.  Drink only water or coffee without cream/sugar on the morning of your lab visit. - Please go ahead and schedule a "Lab Only" visit in the morning at the clinic for lab draw in the next 7 days. - Your results will be available about 2-3 days after blood draw.  If you have set up a MyChart account, you can can log in to MyChart online to view your results and a brief explanation. Also, we can discuss your results together at your next office visit if you would like.   Please schedule a follow-up appointment with [name], AGNP. Return in about 3 months (around 12/18/2017) for weight loss, insomnia, PTSD.  If you have any other questions or concerns, please feel free to call the clinic or send a message through [place]. You may also schedule an earlier appointment if necessary.  You will receive a survey after today's visit either digitally by e-mail or paper by [employer]. Your experiences and feedback matter to [place].  Please respond so we know how we are doing as we provide care for you.   [name], DNP, AGNP-BC Adult Gerontology Nurse Practitioner [place]

## 2017-09-18 NOTE — Progress Notes (Signed)
Subjective:   Kathy Morton is a 72 y.o. female who presents for Medicare Annual (Subsequent) preventive examination.  Review of Systems:   Cardiac Risk Factors include: advanced age (>1men, >27 women);hypertension;dyslipidemia;obesity (BMI >30kg/m2)     Objective:     Vitals: BP 120/74 (BP Location: Left Arm, Patient Position: Sitting)   Pulse 78   Temp 98.3 F (36.8 C) (Oral)   Resp 16   Ht 5\' 2"  (1.575 m)   Wt 171 lb 3.2 oz (77.7 kg)   BMI 31.31 kg/m   Body mass index is 31.31 kg/m.  Advanced Directives 09/18/2017 08/22/2016 11/29/2015  Does Patient Have a Medical Advance Directive? Yes Yes No  Type of Advance Directive Living will;Healthcare Power of [place];Living will -  Copy of [place] in Chart? No - copy requested No - copy requested -  Would patient like information on creating a medical advance directive? - - No - Patient declined    Tobacco Social History   Tobacco Use  Smoking Status Never Smoker  Smokeless Tobacco Never Used     Counseling given: Not Answered   Clinical Intake:  Pre-visit preparation completed: Yes  Pain : No/denies pain     Nutritional Status: BMI > 30  Obese Nutritional Risks: None Diabetes: No  How often do you need to have someone help you when you read instructions, pamphlets, or other written materials from your doctor or pharmacy?: 1 - Never What is the last grade level you completed in school?: some college   Interpreter Needed?: No  Information entered by :: Tiffany Hill,LPN   Past Medical History:  Diagnosis Date  . Anxiety   . Depression   . DVT of leg (deep venous thrombosis) ([place])   . Dysuria   . History of blood clots   . Hypertension   . Incontinence   . Migraine   . Recurrent UTI    Past Surgical History:  Procedure Laterality Date  . 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 COLONOSCOPY WITH PROPOFOL N/A  11/29/2015   Procedure: COLONOSCOPY WITH PROPOFOL;  Surgeon: Jonathon Bellows, MD;  Location: [facility] ENDOSCOPY;  Service: Endoscopy;  Laterality: N/A;  . KNEE SURGERY Left 2010   torn meniscus  . TONSILECTOMY, ADENOIDECTOMY, BILATERAL MYRINGOTOMY AND TUBES     Family History  Problem Relation Age of Onset  . Bladder Cancer Mother   . Heart attack Father   . Breast cancer Maternal Grandmother 80  . Kidney disease Neg Hx    Social History   Socioeconomic History  . Marital status: Divorced    Spouse name: Not on file  . Number of children: Not on file  . Years of education: Not on file  . Highest education level: Some college, no degree  Occupational History  . Not on file  Social Needs  . Financial resource strain: Not hard at all  . Food insecurity:    Worry: Never true    Inability: Never true  . Transportation needs:    Medical: No    Non-medical: No  Tobacco Use  . Smoking status: Never Smoker  . Smokeless tobacco: Never Used  Substance and Sexual Activity  . Alcohol use: No  . Drug use: No  . Sexual activity: Not on file  Lifestyle  . Physical activity:    Days per week: 0 days    Minutes per session: 0 min  . Stress: Not at  all  Relationships  . Social connections:    Talks on phone: More than three times a week    Gets together: Once a week    Attends religious service: Never    Active member of club or organization: No    Attends meetings of clubs or organizations: Never    Relationship status: Divorced  Other Topics Concern  . Not on file  Social History Narrative  . Not on file    Outpatient Encounter Medications as of 09/18/2017  Medication Sig  . atorvastatin (LIPITOR) 20 MG tablet Take 1 tablet (20 mg total) by mouth daily.  . clonazePAM (KLONOPIN) 1 MG tablet TAKE 1 TABLET BY MOUTH ONCE A DAY AS NEEDED FOR ANXIETY  . lisinopril (PRINIVIL,ZESTRIL) 10 MG tablet Take 1 tablet (10 mg total) by mouth daily.  . polyethylene glycol powder (GLYCOLAX/MIRALAX)  powder Take 17 g by mouth daily.  . QUEtiapine (SEROQUEL) 200 MG tablet TAKE 1 TABLET (200 MG TOTAL) BY MOUTH AT BEDTIME.  . SUMAtriptan (IMITREX) 100 MG tablet Take 1/2 tablet at the onset of the headache. May repeat in 2 hours if need to resolve symptoms.  Marland Kitchen topiramate (TOPAMAX) 25 MG tablet TAKE 1 TABLET BY MOUTH EVERY DAY  . valACYclovir (VALTREX) 1000 MG tablet Take 2 tablets (2,000 mg total) by mouth every 12 (twelve) hours.   No facility-administered encounter medications on file as of 09/18/2017.     Activities of Daily Living In your present state of health, do you have any difficulty performing the following activities: 09/18/2017  Hearing? N  Vision? N  Difficulty concentrating or making decisions? N  Walking or climbing stairs? N  Dressing or bathing? N  Doing errands, shopping? N  Preparing Food and eating ? N  Using the Toilet? N  In the past six months, have you accidently leaked urine? Y  Comment wears pads for protection   Do you have problems with loss of bowel control? N  Managing your Medications? N  Managing your Finances? N  Housekeeping or managing your Housekeeping? N  Some recent data might be hidden    Patient Care Team: Mikey College, NP as PCP - General (Nurse Practitioner)    Assessment:   This is a routine wellness examination for [place].  Exercise Activities and Dietary recommendations Current Exercise Habits: Structured exercise class, Time (Minutes): 60, Frequency (Times/Week): 2, Weekly Exercise (Minutes/Week): 120, Intensity: Mild, Exercise limited by: None identified  Goals    . DIET - INCREASE WATER INTAKE      recommend drinking at least 6-8 glasses of water a day        Fall Risk Fall Risk  09/18/2017 10/18/2016 08/22/2016 02/01/2015 11/09/2014  Falls in the past year? No No No Yes -  Number falls in past yr: - - - 1 1  Injury with Fall? - - - Yes No  Comment - - - pt fell in garage and had shoulder pain but improving now -    Is the patient's home free of loose throw rugs in walkways, pet beds, electrical cords, etc?   yes      Grab bars in the bathroom? no      Handrails on the stairs?   no stairs       Adequate lighting?   yes  Timed Get Up and Go performed: Completed in 8 seconds with no use of assistive devices, steady gait. No intervention needed at this time.   Depression Screen PHQ 2/9 Scores  09/18/2017 05/14/2017 10/18/2016 08/22/2016  PHQ - 2 Score 2 0 1 1  PHQ- 9 Score 11 8 10 6      Cognitive Function     6CIT Screen 09/18/2017 08/22/2016  What Year? 0 points 0 points  What month? 0 points 0 points  What time? 0 points 0 points  Count back from 20 0 points 0 points  Months in reverse 0 points 0 points  Repeat phrase 0 points 0 points  Total Score 0 0    Immunization History  Administered Date(s) Administered  . Influenza-Unspecified 11/06/2015, 10/28/2016  . Pneumococcal Conjugate-13 11/09/2014  . Zoster 01/03/2008    Qualifies for Shingles Vaccine? Yes,discussed shingrix vaccine   Screening Tests Health Maintenance  Topic Date Due  . INFLUENZA VACCINE  08/02/2017  . MAMMOGRAM  02/13/2018  . TETANUS/TDAP  01/02/2021  . COLONOSCOPY  11/28/2025  . DEXA SCAN  Completed  . Hepatitis C Screening  Completed  . PNA vac Low Risk Adult  Completed    Cancer Screenings: Lung: Low Dose CT Chest recommended if Age 41-80 years, 30 pack-year currently smoking OR have quit w/in 15years. Patient does not qualify. Breast:  Up to date on Mammogram? Yes  02/16/2016 Up to date of Bone Density/Dexa? Yes 04/17/2016 Colorectal: completed 11/29/2015  Additional Screenings:  Hepatitis C Screening: completed 09/27/2015     Plan:    I have personally reviewed and addressed the Medicare Annual Wellness questionnaire and have noted the following in the patient's chart:  A. Medical and social history B. Use of alcohol, tobacco or illicit drugs  C. Current medications and supplements D. Functional  ability and status E.  Nutritional status F.  Physical activity G. Advance directives H. List of other physicians I.  Hospitalizations, surgeries, and ER visits in previous 12 months J.  [place] such as hearing and vision if needed, cognitive and depression L. Referrals and appointments   In addition, I have reviewed and discussed with patient certain preventive protocols, quality metrics, and best practice recommendations. A written personalized care plan for preventive services as well as general preventive health recommendations were provided to patient.   Signed,  Tyler Aas, LPN Nurse Health Advisor   Nurse Notes:none

## 2017-09-18 NOTE — Progress Notes (Signed)
Subjective:    Patient ID: Kathy Morton, female    DOB: 1945-08-07, 72 y.o.   MRN: [id number]  Kathy Morton is a 72 y.o. female presenting on 09/18/2017 for No chief complaint on file.   HPI Insomnia Not a lot of typical physical activity.  Sleep does not come when falling asleep.   Took longer than 7 day break from Seroquel.  Now takes only as needed.  Prefers to stay with 100 mg dose.  Has taken Seroquel for sleep for long period of time.  Gets about 5-6 hours of sleep when taking.   Feels rested when waking.  When taking, feels a little groggy.  Weight Is back on weight watchers for last 2 weeks.  Goal is 145-150.  Has noted some moderation of appetite with topiramate 25 mg daily.  PTSD Takes one tab daily prn when she has a feeling of dread, anxiety, lonesomeness.  Has not recently tried 1/2 tab and is willing.  Is not taking daily.  Has had fill about every 35 days with #30 tab.  Hyperlipidemia Pt denies changes in vision, chest tightness/pressure, palpitations, shortness of breath, leg pain while walking, leg or arm weakness, and sudden loss of speech or loss of consciousness.   Social History   Tobacco Use  . Smoking status: Never Smoker  . Smokeless tobacco: Never Used  Substance Use Topics  . Alcohol use: No  . Drug use: No    Review of Systems Per HPI unless specifically indicated above     Objective:    BP 120/74   Pulse 78   Temp 98.3 F (36.8 C) (Oral)   Ht 5\' 2"  (1.575 m)   Wt 171 lb 3.2 oz (77.7 kg)   BMI 31.31 kg/m   Wt Readings from Last 3 Encounters:  09/18/17 171 lb 3.2 oz (77.7 kg)  08/13/17 173 lb (78.5 kg)  05/14/17 183 lb (83 kg)    Physical Exam  Constitutional: She is oriented to person, place, and time. She appears well-developed and well-nourished. No distress.  HENT:  Head: Normocephalic and atraumatic.  Cardiovascular: Normal rate, regular rhythm, S1 normal, S2 normal, normal heart sounds and intact distal pulses.    Pulmonary/Chest: Effort normal and breath sounds normal. No respiratory distress.  Neurological: She is alert and oriented to person, place, and time.  Skin: Skin is warm and dry.  Psychiatric: Her speech is normal and behavior is normal. She exhibits a depressed mood.  Vitals reviewed.  Results for orders placed or performed in visit on 10/18/16  Lipid panel  Result Value Ref Range   Cholesterol 130 <200 mg/dL   HDL 52 >50 mg/dL   Triglycerides 93 <150 mg/dL   LDL Cholesterol (Calc) 60 mg/dL (calc)   Total CHOL/HDL Ratio 2.5 <5.0 (calc)   Non-HDL Cholesterol (Calc) 78 <130 mg/dL (calc)  Comprehensive metabolic panel  Result Value Ref Range   Glucose, Bld 84 65 - 99 mg/dL   BUN 18 7 - 25 mg/dL   Creat 0.97 (H) 0.60 - 0.93 mg/dL   BUN/Creatinine Ratio 19 6 - 22 (calc)   Sodium 140 135 - 146 mmol/L   Potassium 4.0 3.5 - 5.3 mmol/L   Chloride 105 98 - 110 mmol/L   CO2 26 20 - 32 mmol/L   Calcium 10.8 (H) 8.6 - 10.4 mg/dL   Total Protein 6.7 6.1 - 8.1 g/dL   Albumin 4.3 3.6 - 5.1 g/dL   Globulin 2.4 1.9 - 3.7 g/dL (  calc)   AG Ratio 1.8 1.0 - 2.5 (calc)   Total Bilirubin 0.4 0.2 - 1.2 mg/dL   Alkaline phosphatase (APISO) 89 33 - 130 U/L   AST 15 10 - 35 U/L   ALT 11 6 - 29 U/L  Hemoglobin A1c  Result Value Ref Range   Hgb A1c MFr Bld 5.4 <5.7 % of total Hgb   Mean Plasma Glucose 108 (calc)   eAG (mmol/L) 6.0 (calc)      Assessment & Plan:   Problem List Items Addressed This Visit      Other   PTSD (post-traumatic stress disorder) Stable today on exam.  Medications tolerated without side effects.  Continue at current doses.  Refills provided.   . Followup 3 months.    Relevant Medications   clonazePAM (KLONOPIN) 1 MG tablet   Hyperlipidemia Status unknown.  Recheck labs.  Continue meds without changes today.  Refills provided. Followup 3 months and after labs.    Relevant Medications   atorvastatin (LIPITOR) 20 MG tablet   Other Relevant Orders   COMPLETE METABOLIC  PANEL WITH GFR   Lipid panel   Insomnia Stable today on exam.  Medications tolerated without side effects.  Continue at current doses.  Refills provided.   . Followup 3 months.    Relevant Medications   QUEtiapine (SEROQUEL) 100 MG tablet   Binge eating disorder Stable today on exam.  Medications tolerated without side effects, but does not fully help with cravings, binge sessions. Increase topiramate to 50 mg daily.  Refills provided.   . Followup 3 months.    Relevant Medications   topiramate (TOPAMAX) 50 MG tablet      Meds ordered this encounter  Medications  . clonazePAM (KLONOPIN) 1 MG tablet    Sig: TAKE 1/2 - 1 TABLET BY MOUTH ONCE A DAY AS NEEDED FOR ANXIETY    Dispense:  25 tablet    Refill:  3    Order Specific Question:   Supervising Provider    Answer:   Olin Hauser [[id number]]  . topiramate (TOPAMAX) 50 MG tablet    Sig: Take 1 tablet (50 mg total) by mouth daily.    Dispense:  90 tablet    Refill:  1    Order Specific Question:   Supervising Provider    Answer:   Olin Hauser [[id number]]  . QUEtiapine (SEROQUEL) 100 MG tablet    Sig: Take 2 tablets (200 mg total) by mouth at bedtime.    Dispense:  30 tablet    Refill:  3    Fill when refill is requested.    Order Specific Question:   Supervising Provider    Answer:   Olin Hauser [[id number]]  . atorvastatin (LIPITOR) 20 MG tablet    Sig: Take 1 tablet (20 mg total) by mouth daily.    Dispense:  90 tablet    Refill:  3    Order Specific Question:   Supervising Provider    Answer:   Olin Hauser [[id number]]   Follow up plan: Return in about 3 months (around 12/18/2017) for weight loss, insomnia, PTSD.  [name], DNP, AGPCNP-BC Adult Gerontology Primary Care Nurse Practitioner [facility] 09/18/2017, 1:51 PM

## 2017-10-07 ENCOUNTER — Other Ambulatory Visit: Payer: Self-pay | Admitting: Nurse Practitioner

## 2017-10-07 DIAGNOSIS — F5081 Binge eating disorder: Secondary | ICD-10-CM

## 2017-11-23 ENCOUNTER — Encounter: Payer: Self-pay | Admitting: Nurse Practitioner

## 2017-12-08 ENCOUNTER — Other Ambulatory Visit: Payer: Self-pay | Admitting: Nurse Practitioner

## 2017-12-08 ENCOUNTER — Other Ambulatory Visit: Payer: Self-pay | Admitting: Family Medicine

## 2017-12-08 DIAGNOSIS — I10 Essential (primary) hypertension: Secondary | ICD-10-CM

## 2017-12-08 DIAGNOSIS — F5101 Primary insomnia: Secondary | ICD-10-CM

## 2017-12-10 MED ORDER — QUETIAPINE FUMARATE 100 MG PO TABS: 100.0000 mg | ORAL_TABLET | Freq: Every day | ORAL | 1 refills | Status: DC

## 2017-12-18 ENCOUNTER — Ambulatory Visit: Payer: Medicare Other | Admitting: Nurse Practitioner

## 2018-01-11 ENCOUNTER — Ambulatory Visit: Payer: Medicare Other | Admitting: Nurse Practitioner

## 2018-01-11 ENCOUNTER — Encounter: Payer: Self-pay | Admitting: Nurse Practitioner

## 2018-01-11 ENCOUNTER — Other Ambulatory Visit: Payer: Self-pay

## 2018-01-11 DIAGNOSIS — E785 Hyperlipidemia, unspecified: Secondary | ICD-10-CM

## 2018-01-11 DIAGNOSIS — F4310 Post-traumatic stress disorder, unspecified: Secondary | ICD-10-CM | POA: Diagnosis not present

## 2018-01-11 DIAGNOSIS — I10 Essential (primary) hypertension: Secondary | ICD-10-CM | POA: Diagnosis not present

## 2018-01-11 DIAGNOSIS — F5101 Primary insomnia: Secondary | ICD-10-CM

## 2018-01-11 DIAGNOSIS — F5081 Binge eating disorder: Secondary | ICD-10-CM | POA: Diagnosis not present

## 2018-01-11 MED ORDER — QUETIAPINE FUMARATE 100 MG PO TABS: 100.0000 mg | ORAL_TABLET | Freq: Every day | ORAL | 1 refills | Status: DC

## 2018-01-11 MED ORDER — CLONAZEPAM 0.5 MG PO TABS: ORAL_TABLET | ORAL | 5 refills | Status: DC

## 2018-01-11 MED ORDER — ATORVASTATIN CALCIUM 20 MG PO TABS: 20.0000 mg | ORAL_TABLET | Freq: Every day | ORAL | 3 refills | Status: DC

## 2018-01-11 MED ORDER — TOPIRAMATE 50 MG PO TABS: 50.0000 mg | ORAL_TABLET | Freq: Every day | ORAL | 1 refills | Status: DC

## 2018-01-11 MED ORDER — LISINOPRIL 10 MG PO TABS: 10.0000 mg | ORAL_TABLET | Freq: Every day | ORAL | 1 refills | Status: DC

## 2018-01-11 NOTE — Progress Notes (Signed)
Subjective:    Patient ID: Kathy Morton, female    DOB: [birth year], 73 y.o.   MRN: [id number]  Kathy Morton is a 73 y.o. female presenting on 01/11/2018 for Weight Check and Insomnia   HPI Insomnia Patient continues taking Seroquel at bedtime - does not feel this is helping with sleep as much.  Notes a 7-day drug holiday occurred about 1 month ago and helped it work better when she resumed this med.  Does not note much difference with insomnia when she was previously taking Topamax.      Weight Check - binge eating/grazing disorder Patient has not been taking topamax over last several weeks/months.  Stopped Topamax due to lack of commitment toward weight loss plans over holidays.   Patient also notes she has had new job, which she hopes will help with her weight loss goals by being more active/engaged.  Is working afternoons with active job.  Has not had grazing after dinner even off Topamax.  Was usually daytime/afternoon grazing previously.  So now has very little opportunity for this behavior now.   - Ongoing problem is that patient feels she cannot control eating at dinner.  Not doing well with servings.  Weakness is adding crackers at end of the meal.  - Frequently cooks a meal and eats from large pot and is not always controlling portions with leftovers.  Anxiety with PTSD Now that she is working, takes clonazepam less frequently.  Typically needs 3 doses of 1/2 tab per week.  Patient is still getting relief with the lower dose.  Occasionally takes also at bedtime if not able to get her mind/worries to calm down for sleep.  Depression screen [facility] 2/9 01/11/2018 09/18/2017 05/14/2017 10/18/2016 08/22/2016  Decreased Interest 0 1 0 0 0  Down, Depressed, Hopeless 0 1 0 1 1  PHQ - 2 Score 0 2 0 1 1  Altered sleeping 3 3 2 2 1   Tired, decreased energy 2 2 3 2 1   Change in appetite 2 2 3 2 1   Feeling bad or failure about yourself  0 1 0 2 1  Trouble concentrating 0 1 0 1 1  Moving  slowly or fidgety/restless 0 0 0 0 0  Suicidal thoughts 0 0 0 0 0  PHQ-9 Score 7 11 8 10 6   Difficult doing work/chores Not difficult at all Not difficult at all Not difficult at all Not difficult at all Not difficult at all    GAD 7 : Generalized Anxiety Score 01/11/2018 10/18/2016 04/26/2015  Nervous, Anxious, on Edge 2 2 2   Control/stop worrying 0 1 2  Worry too much - different things 0 1 0  Trouble relaxing 2 1 1   Restless 2 1 0  Easily annoyed or irritable 0 1 2  Afraid - awful might happen 0 0 0  Total GAD 7 Score 6 7 7   Anxiety Difficulty Not difficult at all Not difficult at all Somewhat difficult     Social History   Tobacco Use  . Smoking status: Never Smoker  . Smokeless tobacco: Never Used  Substance Use Topics  . Alcohol use: No  . Drug use: No    Review of Systems Per HPI unless specifically indicated above     Objective:    BP (!) 141/60 (BP Location: Right Arm, Patient Position: Sitting, Cuff Size: Normal)   Pulse 74   Temp 98.4 F (36.9 C) (Oral)   Ht 5\' 2"  (1.575 m)   Wt  175 lb 3.2 oz (79.5 kg)   BMI 32.04 kg/m   Wt Readings from Last 3 Encounters:  01/11/18 175 lb 3.2 oz (79.5 kg)  09/18/17 171 lb 3.2 oz (77.7 kg)  09/18/17 171 lb 3.2 oz (77.7 kg)    Physical Exam Vitals signs reviewed.  Constitutional:      General: She is not in acute distress.    Appearance: She is well-developed. She is obese.  HENT:     Head: Normocephalic and atraumatic.  Cardiovascular:     Rate and Rhythm: Normal rate and regular rhythm.     Pulses:          Radial pulses are 2+ on the right side and 2+ on the left side.       Posterior tibial pulses are 1+ on the right side and 1+ on the left side.     Heart sounds: Normal heart sounds, S1 normal and S2 normal.  Pulmonary:     Effort: Pulmonary effort is normal. No respiratory distress.     Breath sounds: Normal breath sounds and air entry.  Musculoskeletal:     Right lower leg: No edema.     Left lower leg:  No edema.  Skin:    General: Skin is warm and dry.     Capillary Refill: Capillary refill takes less than 2 seconds.  Neurological:     Mental Status: She is alert and oriented to person, place, and time.  Psychiatric:        Attention and Perception: Attention normal.        Mood and Affect: Mood and affect normal.        Speech: Speech normal.        Behavior: Behavior normal. Behavior is cooperative.        Thought Content: Thought content normal.        Cognition and Memory: Cognition and memory normal.        Judgment: Judgment normal.     Results for orders placed or performed in visit on 10/18/16  Lipid panel  Result Value Ref Range   Cholesterol 130 <200 mg/dL   HDL 52 >50 mg/dL   Triglycerides 93 <150 mg/dL   LDL Cholesterol (Calc) 60 mg/dL (calc)   Total CHOL/HDL Ratio 2.5 <5.0 (calc)   Non-HDL Cholesterol (Calc) 78 <130 mg/dL (calc)  Comprehensive metabolic panel  Result Value Ref Range   Glucose, Bld 84 65 - 99 mg/dL   BUN 18 7 - 25 mg/dL   Creat 0.97 (H) 0.60 - 0.93 mg/dL   BUN/Creatinine Ratio 19 6 - 22 (calc)   Sodium 140 135 - 146 mmol/L   Potassium 4.0 3.5 - 5.3 mmol/L   Chloride 105 98 - 110 mmol/L   CO2 26 20 - 32 mmol/L   Calcium 10.8 (H) 8.6 - 10.4 mg/dL   Total Protein 6.7 6.1 - 8.1 g/dL   Albumin 4.3 3.6 - 5.1 g/dL   Globulin 2.4 1.9 - 3.7 g/dL (calc)   AG Ratio 1.8 1.0 - 2.5 (calc)   Total Bilirubin 0.4 0.2 - 1.2 mg/dL   Alkaline phosphatase (APISO) 89 33 - 130 U/L   AST 15 10 - 35 U/L   ALT 11 6 - 29 U/L  Hemoglobin A1c  Result Value Ref Range   Hgb A1c MFr Bld 5.4 <5.7 % of total Hgb   Mean Plasma Glucose 108 (calc)   eAG (mmol/L) 6.0 (calc)      Assessment & Plan:  Problem List Items Addressed This Visit      Cardiovascular and Mediastinum   Hypertension    Mildly elevated today, but stable on medication. Pt is taking lisinopril 10 mg daily. Has no current symptoms or complications.  Plan: 1. Pt encouraged to continue to focus  on lifestyle modification.  2. Regular BP check at local drug store to monitor. 3. Continue lisinopril 10 mg once daily.  Patient requests to stop taking medication and does not want to increase dose. 4. Plan to recheck BP in 2-3 mos.        Relevant Medications   lisinopril (PRINIVIL,ZESTRIL) 10 MG tablet   atorvastatin (LIPITOR) 20 MG tablet     Other   PTSD (post-traumatic stress disorder)    Pt has PTSD w/associated panic disorder, anxiety and depression.  Moderate anxiety and depression w/ PHQ9 and GAD7 today improved slightly.  Pt has reduced need for prn clonazepam to 3 times weekly 0.5 mg.  Also takes Seroquel for sleep and is noting some perceived need for higher dose.  Previously improved with drug holiday.  Plan: 1. CONTINUE seroquel 100 mg hs one tablet po once daily.  Take drug holiday x 5-7 days for improving effectiveness with sleep. 2. Encouraged good sleep hygiene. 3. Continue clonazepam 0.5 mg daily prn panic attack.  Reduce monthly supply to # 25 tabs 0.5 mg.  This is > 50% reduction from last fills and reflects patient's current rate of use. PDMP reviewed for fill dates and frequency, which is consistent with patient report. 4. Follow up in 6 months.      Relevant Medications   clonazePAM (KLONOPIN) 0.5 MG tablet   Hyperlipidemia    Controlled on last check.  Patient continues working on lifestyle modification for weight loss.  Pt taking statin med and tolerating well.  Plan: 1. Reviewed need for statin and showed positive response to med w/ last labs. 2. Recheck lipid panel next visit. 3. Continue atorvastatin 20 mg once daily. 4. Follow up every 6 mos.      Relevant Medications   lisinopril (PRINIVIL,ZESTRIL) 10 MG tablet   atorvastatin (LIPITOR) 20 MG tablet   Insomnia    See AP anxiety      Relevant Medications   QUEtiapine (SEROQUEL) 100 MG tablet   Binge eating disorder    Mild improvement with less idle time in afternoon when grazing was more  common.  Binge eating disorder remains present with grazing presentation and tendency to eat large meal at supper with continued eating past feeling full.  Pt continues eating throughout afternoon/evening without any hunger cues and to provide emotional relief of depression/anxiety.    Plan: 1. Continue topiramate 50 mg once daily. Consider future increase to 75 mg prn.  For now, resume at prior dose and continue to commit to portion control and limiting snacking intentionally. Discussed measuring out meal portions prior to putting into the refrigerator as patient does when she freezes meal servings for leftovers. 2. Continue working on keeping an active lifestyle. 3. Follow-up 2 months.      Relevant Medications   topiramate (TOPAMAX) 50 MG tablet      Meds ordered this encounter  Medications  . clonazePAM (KLONOPIN) 0.5 MG tablet    Sig: 1 TABLET BY MOUTH ONCE A DAY AS NEEDED FOR ANXIETY    Dispense:  25 tablet    Refill:  5    Cancel remaining refill on clonazepam 1 mg tablets.    Order Specific Question:  Supervising Provider    Answer:   Olin Hauser [[id number]]  . lisinopril (PRINIVIL,ZESTRIL) 10 MG tablet    Sig: Take 1 tablet (10 mg total) by mouth daily.    Dispense:  90 tablet    Refill:  1    Order Specific Question:   Supervising Provider    Answer:   Olin Hauser [[id number]]  . QUEtiapine (SEROQUEL) 100 MG tablet    Sig: Take 1 tablet (100 mg total) by mouth at bedtime.    Dispense:  90 tablet    Refill:  1    Fill when refill is requested.    Order Specific Question:   Supervising Provider    Answer:   Olin Hauser [[id number]]  . topiramate (TOPAMAX) 50 MG tablet    Sig: Take 1 tablet (50 mg total) by mouth daily.    Dispense:  90 tablet    Refill:  1    Order Specific Question:   Supervising Provider    Answer:   Olin Hauser [[id number]]  . atorvastatin (LIPITOR) 20 MG tablet    Sig: Take 1 tablet (20 mg total) by mouth daily.     Dispense:  90 tablet    Refill:  3    Order Specific Question:   Supervising Provider    Answer:   Olin Hauser [[id number]]    Follow up plan: Return in about 2 months (around 03/12/2018) for hypertension, grazing/binge eating, cholesterol.  [name], DNP, AGPCNP-BC Adult Gerontology Primary Care Nurse Practitioner [facility] 01/11/2018, 1:30 PM

## 2018-01-11 NOTE — Patient Instructions (Addendum)
Kathy Morton,   Thank you for coming in to clinic today.  1. Resume Topamax 50 mg at bedtime for controlling cravings with eating/grazing.  - Start portioning your meals for leftovers to have portion control. - You may need higher dose or twice daily dosing of Topamax in future - look for increased cravings/grazing as medication effect tapers off. - Migraines may improve back on Topamax.  2. Labs Fasting at next visit.  3. Try "drug holiday" 2-5 days off Seroquel to keep it effective.   Continue at 100 mg once daily.  4. Clonazepam supply will be reducing.  Take 1/2 tablet once daily as needed for anxiety.  25 tablets will need to last 30 days.  Please schedule a follow-up appointment with [name], AGNP. Return in about 2 months (around 03/12/2018) for hypertension, grazing/binge eating, cholesterol.  If you have any other questions or concerns, please feel free to call the clinic or send a message through [facility]. You may also schedule an earlier appointment if necessary.  You will receive a survey after today's visit either digitally by e-mail or paper by [employer]. Your experiences and feedback matter to [place].  Please respond so we know how we are doing as we provide care for you.   [name], DNP, AGNP-BC Adult Gerontology Nurse Practitioner [place]

## 2018-01-14 ENCOUNTER — Encounter: Payer: Self-pay | Admitting: Nurse Practitioner

## 2018-01-14 NOTE — Assessment & Plan Note (Signed)
Pt has PTSD w/associated panic disorder, anxiety and depression.  Moderate anxiety and depression w/ PHQ9 and GAD7 today improved slightly.  Pt has reduced need for prn clonazepam to 3 times weekly 0.5 mg.  Also takes Seroquel for sleep and is noting some perceived need for higher dose.  Previously improved with drug holiday.  Plan: 1. CONTINUE seroquel 100 mg hs one tablet po once daily.  Take drug holiday x 5-7 days for improving effectiveness with sleep. 2. Encouraged good sleep hygiene. 3. Continue clonazepam 0.5 mg daily prn panic attack.  Reduce monthly supply to # 25 tabs 0.5 mg.  This is > 50% reduction from last fills and reflects patient's current rate of use. PDMP reviewed for fill dates and frequency, which is consistent with patient report. 4. Follow up in 6 months.

## 2018-01-14 NOTE — Assessment & Plan Note (Signed)
Mildly elevated today, but stable on medication. Pt is taking lisinopril 10 mg daily. Has no current symptoms or complications.  Plan: 1. Pt encouraged to continue to focus on lifestyle modification.  2. Regular BP check at local drug store to monitor. 3. Continue lisinopril 10 mg once daily.  Patient requests to stop taking medication and does not want to increase dose. 4. Plan to recheck BP in 2-3 mos.

## 2018-01-14 NOTE — Assessment & Plan Note (Signed)
See AP anxiety

## 2018-01-14 NOTE — Assessment & Plan Note (Addendum)
Mild improvement with less idle time in afternoon when grazing was more common.  Binge eating disorder remains present with grazing presentation and tendency to eat large meal at supper with continued eating past feeling full.  Pt continues eating throughout afternoon/evening without any hunger cues and to provide emotional relief of depression/anxiety.    Plan: 1. Continue topiramate 50 mg once daily. Consider future increase to 75 mg prn.  For now, resume at prior dose and continue to commit to portion control and limiting snacking intentionally. Discussed measuring out meal portions prior to putting into the refrigerator as patient does when she freezes meal servings for leftovers. 2. Continue working on keeping an active lifestyle. 3. Follow-up 2 months.

## 2018-01-14 NOTE — Assessment & Plan Note (Signed)
Controlled on last check.  Patient continues working on lifestyle modification for weight loss.  Pt taking statin med and tolerating well.  Plan: 1. Reviewed need for statin and showed positive response to med w/ last labs. 2. Recheck lipid panel next visit. 3. Continue atorvastatin 20 mg once daily. 4. Follow up every 6 mos.

## 2018-01-22 ENCOUNTER — Other Ambulatory Visit: Payer: Self-pay | Admitting: Nurse Practitioner

## 2018-01-22 DIAGNOSIS — Z1231 Encounter for screening mammogram for malignant neoplasm of breast: Secondary | ICD-10-CM

## 2018-02-15 ENCOUNTER — Ambulatory Visit
Admission: RE | Admit: 2018-02-15 | Discharge: 2018-02-15 | Disposition: A | Discharge status: Home / Self Care | Payer: Medicare Other | Source: Ambulatory Visit | Attending: Nurse Practitioner | Admitting: Nurse Practitioner

## 2018-02-15 DIAGNOSIS — Z1231 Encounter for screening mammogram for malignant neoplasm of breast: Secondary | ICD-10-CM | POA: Insufficient documentation

## 2018-02-25 ENCOUNTER — Other Ambulatory Visit: Payer: Self-pay | Admitting: Family Medicine

## 2018-02-25 DIAGNOSIS — G43709 Chronic migraine without aura, not intractable, without status migrainosus: Secondary | ICD-10-CM

## 2018-03-15 ENCOUNTER — Ambulatory Visit: Payer: Medicare Other | Admitting: Nurse Practitioner

## 2018-03-15 ENCOUNTER — Other Ambulatory Visit: Payer: Medicare Other

## 2018-04-07 ENCOUNTER — Other Ambulatory Visit: Payer: Self-pay | Admitting: Nurse Practitioner

## 2018-04-07 ENCOUNTER — Other Ambulatory Visit: Payer: Self-pay | Admitting: Family Medicine

## 2018-04-07 DIAGNOSIS — F5081 Binge eating disorder: Secondary | ICD-10-CM

## 2018-04-07 DIAGNOSIS — F5101 Primary insomnia: Secondary | ICD-10-CM

## 2018-04-08 MED ORDER — TOPIRAMATE 50 MG PO TABS: 50.0000 mg | ORAL_TABLET | Freq: Every day | ORAL | 1 refills | Status: DC

## 2018-08-15 ENCOUNTER — Telehealth: Payer: Self-pay | Admitting: Nurse Practitioner

## 2018-08-15 NOTE — Telephone Encounter (Signed)
Pt called requesting refill on clonazepam BUT SHE IS REQUESTING  To have medication  Increase to 1 mg and  30 tablets

## 2018-08-15 NOTE — Telephone Encounter (Signed)
She will need an appointment to address her anxiety.

## 2018-08-16 ENCOUNTER — Other Ambulatory Visit: Payer: Self-pay

## 2018-08-16 ENCOUNTER — Ambulatory Visit (INDEPENDENT_AMBULATORY_CARE_PROVIDER_SITE_OTHER): Payer: Medicare Other | Admitting: Nurse Practitioner

## 2018-08-16 ENCOUNTER — Encounter: Payer: Self-pay | Admitting: Nurse Practitioner

## 2018-08-16 DIAGNOSIS — Z604 Social exclusion and rejection: Secondary | ICD-10-CM | POA: Diagnosis not present

## 2018-08-16 DIAGNOSIS — F4310 Post-traumatic stress disorder, unspecified: Secondary | ICD-10-CM | POA: Diagnosis not present

## 2018-08-16 DIAGNOSIS — F329 Major depressive disorder, single episode, unspecified: Secondary | ICD-10-CM | POA: Diagnosis not present

## 2018-08-16 MED ORDER — CLONAZEPAM 0.5 MG PO TABS: ORAL_TABLET | ORAL | 5 refills | Status: DC

## 2018-08-16 NOTE — Progress Notes (Signed)
Telemedicine Encounter: Disclosed to patient at start of encounter that we will provide appropriate telemedicine services.  Patient consents to be treated via phone prior to discussion. - Patient is at her home and is accessed via telephone. - Services are provided by [name] from [facility].  Subjective:    Patient ID: Kathy Morton, female    DOB: [birth year], 73 y.o.   MRN: [id number]  Kathy Morton is a 73 y.o. female presenting on 08/16/2018 for Depression  HPI Depression Patient is now furloughed from job since February.  Is home, isolated since that time.  Feels "silly complaining because I feel very blessed I have a home and no financial issues."  Is not talking to anyone for days on end.  She has 3 sons, each calls once per week or every other week 3 15 min conversations per week.  She has a dog that may need to be put down in next 2 weeks due to illness.  Day by day right now because she is not ready to let him go yet.   Every day is the same.  Only so much cleaning/laundry/cooking.  Dogs are the reason she gets up in the morning.  Goes to grocery store for pickup order every 10 days.   - Lonely.  Patient is generally fine alone, but had 15 hours of being around people at her job.  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   - Weight gain has continued - now weighs 178 at home.  - Patient continues to walk in her neighborhood.  - Older son comes by once per week, stays outside.   Depression screen [facility] 2/9 08/16/2018 01/11/2018 09/18/2017 05/14/2017 10/18/2016  Decreased Interest 2 0 1 0 0  Down, Depressed, Hopeless 2 0 1 0 1  PHQ - 2 Score 4 0 2 0 1  Altered sleeping 3 3 3 2 2   Tired, decreased energy 3 2 2 3 2   Change in appetite 3 2 2 3 2   Feeling bad or failure about yourself  3 0 1 0 2  Trouble concentrating 0 0 1 0 1  Moving slowly or fidgety/restless 0 0 0 0 0  Suicidal thoughts 0 0 0 0 0  PHQ-9 Score  16 7 11 8 10   Difficult doing work/chores Not difficult at all Not difficult at all Not difficult at all Not difficult at all Not difficult at all     Social History   Tobacco Use  . Smoking status: Never Smoker  . Smokeless tobacco: Never Used  Substance Use Topics  . Alcohol use: No  . Drug use: No    Review of Systems Per HPI unless specifically indicated above     Objective:    There were no vitals taken for this visit.  Wt Readings from Last 3 Encounters:  01/11/18 175 lb 3.2 oz (79.5 kg)  09/18/17 171 lb 3.2 oz (77.7 kg)  09/18/17 171 lb 3.2 oz (77.7 kg)    Physical Exam Patient remotely monitored.  Verbal communication appropriate.  Cognition normal.   Results for orders placed or performed in visit on 10/18/16  Lipid panel  Result Value Ref Range   Cholesterol 130 <200 mg/dL   HDL 52 >50 mg/dL   Triglycerides 93 <150 mg/dL   LDL Cholesterol (Calc) 60 mg/dL (calc)   Total CHOL/HDL Ratio 2.5 <5.0 (calc)   Non-HDL Cholesterol (Calc) 78 <130 mg/dL (calc)  Comprehensive metabolic panel  Result Value Ref Range   Glucose, Bld 84 65 - 99 mg/dL   BUN 18 7 - 25 mg/dL   Creat 0.97 (H) 0.60 - 0.93 mg/dL   BUN/Creatinine Ratio 19 6 - 22 (calc)   Sodium 140 135 - 146 mmol/L   Potassium 4.0 3.5 - 5.3 mmol/L   Chloride 105 98 - 110 mmol/L   CO2 26 20 - 32 mmol/L   Calcium 10.8 (H) 8.6 - 10.4 mg/dL   Total Protein 6.7 6.1 - 8.1 g/dL   Albumin 4.3 3.6 - 5.1 g/dL   Globulin 2.4 1.9 - 3.7 g/dL (calc)   AG Ratio 1.8 1.0 - 2.5 (calc)   Total Bilirubin 0.4 0.2 - 1.2 mg/dL   Alkaline phosphatase (APISO) 89 33 - 130 U/L   AST 15 10 - 35 U/L   ALT 11 6 - 29 U/L  Hemoglobin A1c  Result Value Ref Range   Hgb A1c MFr Bld 5.4 <5.7 % of total Hgb   Mean Plasma Glucose 108 (calc)   eAG (mmol/L) 6.0 (calc)      Assessment & Plan:   Problem List Items Addressed This Visit      Other   PTSD (post-traumatic stress disorder)   Relevant Medications   clonazePAM (KLONOPIN) 0.5  MG tablet   Depression    Other Visit Diagnoses    Social isolation    -  Primary      PTSD and Depression worsening 2/2 social isolation from Covid precautions.  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  Denies SI/HI and has no plans to carry out if SI/HI arise.  Plan: 1. 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   2. Increase time with family if possible. 3. May take clonazepam more frequently if needed.  Take 1 tab twice daily prn anxiety.  Do not encourage patient to take 1 mg per dose.  4. Continue seroquel 100 mg bedtime.  Consider dose increase for SSRI increase.  Patient declines. 5. Should consider resuming topiramate for binge eating.  This will also help mental health.  Other meds not well tolerated in past, so this medication should be used for depression/PTSD if needed. 6. Follow-up 6-8 weeks and sooner if needed.  Meds ordered this encounter  Medications  . clonazePAM (KLONOPIN) 0.5 MG tablet    Sig: 1 TABLET BY MOUTH TWICE A DAY AS NEEDED FOR ANXIETY    Dispense:  45 tablet    Refill:  5    Cancel remaining refill on clonazepam 1 mg tablets.    Order Specific Question:   Supervising Provider    Answer:   Olin Hauser [[id number]]    - Time spent in direct consultation with patient via telemedicine about above concerns: 18 minutes  Follow up plan: Return in about 6 weeks (around 09/27/2018) for depression.  [name], DNP, AGPCNP-BC Adult Gerontology Primary Care Nurse Practitioner [facility] 08/16/2018, 10:20 AM

## 2018-08-19 ENCOUNTER — Encounter: Payer: Self-pay | Admitting: Nurse Practitioner

## 2018-08-19 DIAGNOSIS — F32A Depression, unspecified: Secondary | ICD-10-CM | POA: Insufficient documentation

## 2018-08-19 DIAGNOSIS — F329 Major depressive disorder, single episode, unspecified: Secondary | ICD-10-CM | POA: Insufficient documentation

## 2018-10-01 ENCOUNTER — Ambulatory Visit: Payer: Medicare Other

## 2018-10-02 ENCOUNTER — Other Ambulatory Visit: Payer: Self-pay | Admitting: Nurse Practitioner

## 2018-10-02 DIAGNOSIS — F5101 Primary insomnia: Secondary | ICD-10-CM

## 2018-10-04 ENCOUNTER — Other Ambulatory Visit: Payer: Self-pay | Admitting: Nurse Practitioner

## 2018-10-04 DIAGNOSIS — I10 Essential (primary) hypertension: Secondary | ICD-10-CM

## 2018-10-08 ENCOUNTER — Ambulatory Visit (INDEPENDENT_AMBULATORY_CARE_PROVIDER_SITE_OTHER): Payer: Medicare Other

## 2018-10-08 VITALS — Ht 62.0 in | Wt 178.0 lb

## 2018-10-08 DIAGNOSIS — Z0000 Encounter for general adult medical examination without abnormal findings: Secondary | ICD-10-CM | POA: Diagnosis not present

## 2018-10-08 NOTE — Patient Instructions (Signed)
Kathy Morton , Thank you for taking time to come for your Medicare Wellness Visit. I appreciate your ongoing commitment to your health goals. Please review the following plan we discussed and let me know if I can assist you in the future.   Screening recommendations/referrals: Colonoscopy: completed 11/29/2015 Mammogram: completed 2/14/02020 Bone Density: completed 04/17/2016 Recommended yearly ophthalmology/optometry visit for glaucoma screening and checkup Recommended yearly dental visit for hygiene and checkup  Vaccinations: Influenza vaccine: up to date Pneumococcal vaccine: up to date Tdap vaccine: up to date Shingles vaccine: shingrix eligible     Advanced directives: Please bring a copy of your health care power of attorney and living will to the office at your convenience.  Conditions/risks identified: none   Next appointment: Follow up in one year for your annual wellness visit.    Preventive Care 73 Years and Older, Female Preventive care refers to lifestyle choices and visits with your health care provider that can promote health and wellness. What does preventive care include?  A yearly physical exam. This is also called an annual well check.  Dental exams once or twice a year.  Routine eye exams. Ask your health care provider how often you should have your eyes checked.  Personal lifestyle choices, including:  Daily care of your teeth and gums.  Regular physical activity.  Eating a healthy diet.  Avoiding tobacco and drug use.  Limiting alcohol use.  Practicing safe sex.  Taking low-dose aspirin every day.  Taking vitamin and mineral supplements as recommended by your health care provider. What happens during an annual well check? The services and screenings done by your health care provider during your annual well check will depend on your age, overall health, lifestyle risk factors, and family history of disease. Counseling  Your health care  provider may ask you questions about your:  Alcohol use.  Tobacco use.  Drug use.  Emotional well-being.  Home and relationship well-being.  Sexual activity.  Eating habits.  History of falls.  Memory and ability to understand (cognition).  Work and work Statistician.  Reproductive health. Screening  You may have the following tests or measurements:  Height, weight, and BMI.  Blood pressure.  Lipid and cholesterol levels. These may be checked every 5 years, or more frequently if you are over 60 years old.  Skin check.  Lung cancer screening. You may have this screening every year starting at age 73 if you have a 30-pack-year history of smoking and currently smoke or have quit within the past 15 years.  Fecal occult blood test (FOBT) of the stool. You may have this test every year starting at age 73  Flexible sigmoidoscopy or colonoscopy. You may have a sigmoidoscopy every 5 years or a colonoscopy every 10 years starting at age 73  Hepatitis C blood test.  Hepatitis B blood test.  Sexually transmitted disease (STD) testing.  Diabetes screening. This is done by checking your blood sugar (glucose) after you have not eaten for a while (fasting). You may have this done every 1-3 years.  Bone density scan. This is done to screen for osteoporosis. You may have this done starting at age 65.  Mammogram. This may be done every 1-2 years. Talk to your health care provider about how often you should have regular mammograms. Talk with your health care provider about your test results, treatment options, and if necessary, the need for more tests. Vaccines  Your health care provider may recommend certain vaccines, such as:  Influenza vaccine. This is recommended every year.  Tetanus, diphtheria, and acellular pertussis (Tdap, Td) vaccine. You may need a Td booster every 10 years.  Zoster vaccine. You may need this after age 73  Pneumococcal 13-valent conjugate (PCV13)  vaccine. One dose is recommended after age 73  Pneumococcal polysaccharide (PPSV23) vaccine. One dose is recommended after age 73 Talk to your health care provider about which screenings and vaccines you need and how often you need them. This information is not intended to replace advice given to you by your health care provider. Make sure you discuss any questions you have with your health care provider. Document Released: 01/15/2015 Document Revised: 09/08/2015 Document Reviewed: 10/20/2014 Elsevier Interactive Patient Education  2017 [place] Prevention in the Home Falls can cause injuries. They can happen to people of all ages. There are many things you can do to make your home safe and to help prevent falls. What can I do on the outside of my home?  Regularly fix the edges of walkways and driveways and fix any cracks.  Remove anything that might make you trip as you walk through a door, such as a raised step or threshold.  Trim any bushes or trees on the path to your home.  Use bright outdoor lighting.  Clear any walking paths of anything that might make someone trip, such as rocks or tools.  Regularly check to see if handrails are loose or broken. Make sure that both sides of any steps have handrails.  Any raised decks and porches should have guardrails on the edges.  Have any leaves, snow, or ice cleared regularly.  Use sand or salt on walking paths during winter.  Clean up any spills in your garage right away. This includes oil or grease spills. What can I do in the bathroom?  Use night lights.  Install grab bars by the toilet and in the tub and shower. Do not use towel bars as grab bars.  Use non-skid mats or decals in the tub or shower.  If you need to sit down in the shower, use a plastic, non-slip stool.  Keep the floor dry. Clean up any water that spills on the floor as soon as it happens.  Remove soap buildup in the tub or shower regularly.   Attach bath mats securely with double-sided non-slip rug tape.  Do not have throw rugs and other things on the floor that can make you trip. What can I do in the bedroom?  Use night lights.  Make sure that you have a light by your bed that is easy to reach.  Do not use any sheets or blankets that are too big for your bed. They should not hang down onto the floor.  Have a firm chair that has side arms. You can use this for support while you get dressed.  Do not have throw rugs and other things on the floor that can make you trip. What can I do in the kitchen?  Clean up any spills right away.  Avoid walking on wet floors.  Keep items that you use a lot in easy-to-reach places.  If you need to reach something above you, use a strong step stool that has a grab bar.  Keep electrical cords out of the way.  Do not use floor polish or wax that makes floors slippery. If you must use wax, use non-skid floor wax.  Do not have throw rugs and other things on the floor that  can make you trip. What can I do with my stairs?  Do not leave any items on the stairs.  Make sure that there are handrails on both sides of the stairs and use them. Fix handrails that are broken or loose. Make sure that handrails are as long as the stairways.  Check any carpeting to make sure that it is firmly attached to the stairs. Fix any carpet that is loose or worn.  Avoid having throw rugs at the top or bottom of the stairs. If you do have throw rugs, attach them to the floor with carpet tape.  Make sure that you have a light switch at the top of the stairs and the bottom of the stairs. If you do not have them, ask someone to add them for you. What else can I do to help prevent falls?  Wear shoes that:  Do not have high heels.  Have rubber bottoms.  Are comfortable and fit you well.  Are closed at the toe. Do not wear sandals.  If you use a stepladder:  Make sure that it is fully opened. Do not climb  a closed stepladder.  Make sure that both sides of the stepladder are locked into place.  Ask someone to hold it for you, if possible.  Clearly mark and make sure that you can see:  Any grab bars or handrails.  First and last steps.  Where the edge of each step is.  Use tools that help you move around (mobility aids) if they are needed. These include:  Canes.  Walkers.  Scooters.  Crutches.  Turn on the lights when you go into a dark area. Replace any light bulbs as soon as they burn out.  Set up your furniture so you have a clear path. Avoid moving your furniture around.  If any of your floors are uneven, fix them.  If there are any pets around you, be aware of where they are.  Review your medicines with your doctor. Some medicines can make you feel dizzy. This can increase your chance of falling. Ask your doctor what other things that you can do to help prevent falls. This information is not intended to replace advice given to you by your health care provider. Make sure you discuss any questions you have with your health care provider. Document Released: 10/15/2008 Document Revised: 05/27/2015 Document Reviewed: 01/23/2014 Elsevier Interactive Patient Education  2017 [employer].

## 2018-10-08 NOTE — Progress Notes (Signed)
Subjective:   Kathy Morton is a 73 y.o. female who presents for Medicare Annual (Subsequent) preventive examination.  This visit is being conducted via phone call  - after an attmept to do on video chat - due to the COVID-19 pandemic. This patient has given me verbal consent via phone to conduct this visit, patient states they are participating from their home address. Some vital signs may be absent or patient reported.   Patient identification: identified by name, DOB, and current address.    Review of Systems:  Cardiac Risk Factors include: advanced age (>19men, >15 women);hypertension;dyslipidemia     Objective:     Vitals: Ht 5\' 2"  (1.575 m)    Wt 178 lb (80.7 kg) Comment: pt reported   BMI 32.56 kg/m   Body mass index is 32.56 kg/m.  Advanced Directives 09/18/2017 08/22/2016 11/29/2015  Does Patient Have a Medical Advance Directive? Yes Yes No  Type of Advance Directive Living will;Healthcare Power of [place];Living will -  Copy of [place] in Chart? No - copy requested No - copy requested -  Would patient like information on creating a medical advance directive? - - No - Patient declined    Tobacco Social History   Tobacco Use  Smoking Status Never Smoker  Smokeless Tobacco Never Used     Counseling given: Not Answered   Clinical Intake:  Pre-visit preparation completed: Yes  Pain : No/denies pain     Nutritional Status: BMI > 30  Obese Nutritional Risks: None Diabetes: No  How often do you need to have someone help you when you read instructions, pamphlets, or other written materials from your doctor or pharmacy?: 1 - Never  Interpreter Needed?: No  Information entered by :: Kamori Kitchens,LPN  Past Medical History:  Diagnosis Date   Anxiety    Depression    DVT of leg (deep venous thrombosis) ([place])    Dysuria    History of blood clots    Hypertension    Incontinence    Migraine     Recurrent UTI    Past Surgical History:  Procedure Laterality Date   BREAST BIOPSY Left 01/06/13   benign finding of sclerosing adenosis   COLONOSCOPY WITH PROPOFOL N/A 11/29/2015   Procedure: COLONOSCOPY WITH PROPOFOL;  Surgeon: Jonathon Bellows, MD;  Location: [facility] ENDOSCOPY;  Service: Endoscopy;  Laterality: N/A;   KNEE SURGERY Left 2010   torn meniscus   TONSILECTOMY, ADENOIDECTOMY, BILATERAL MYRINGOTOMY AND TUBES     Family History  Problem Relation Age of Onset   Bladder Cancer Mother    Heart attack Father    Breast cancer Maternal Grandmother 76   Kidney disease Neg Hx    Social History   Socioeconomic History   Marital status: Divorced    Spouse name: Not on file   Number of children: Not on file   Years of education: Not on file   Highest education level: Some college, no degree  Occupational History   Occupation: retired   Scientist, product/process development strain: Not hard at [employer]    Worry: Never true    Inability: Never true   Transportation needs    Medical: No    Non-medical: No  Tobacco Use   Smoking status: Never Smoker   Smokeless tobacco: Never Used  Substance and Sexual Activity   Alcohol use: No   Drug use: No   Sexual activity: Not on file  Lifestyle   Physical activity    Days per week: 3 days    Minutes per session: 30 min   Stress: Not at all  Relationships   Social connections    Talks on phone: More than three times a week    Gets together: Once a week    Attends religious service: Never    Active member of club or organization: No    Attends meetings of clubs or organizations: Never    Relationship status: Divorced  Other Topics Concern   Not on file  Social History Narrative   Not on file    Outpatient Encounter Medications as of 10/08/2018  Medication Sig   atorvastatin (LIPITOR) 20 MG tablet Take 1 tablet (20 mg total) by mouth daily.   clonazePAM (KLONOPIN) 0.5 MG tablet 1 TABLET BY  MOUTH TWICE A DAY AS NEEDED FOR ANXIETY   lisinopril (ZESTRIL) 10 MG tablet TAKE 1 TABLET (10 MG TOTAL) BY MOUTH DAILY.   QUEtiapine (SEROQUEL) 100 MG tablet TAKE 1 TABLET (100 MG TOTAL) BY MOUTH AT BEDTIME.   SUMAtriptan (IMITREX) 100 MG tablet TAKE 1/2 TABLET AT THE ONSET OF THE HEADACHE. MAY REPEAT IN 2 HOURS IF NEED TO RESOLVE SYMPTOMS.   [DISCONTINUED] topiramate (TOPAMAX) 50 MG tablet Take 1 tablet (50 mg total) by mouth daily. (Patient not taking: Reported on 08/16/2018)   No facility-administered encounter medications on file as of 10/08/2018.     Activities of Daily Living In your present state of health, do you have any difficulty performing the following activities: 10/08/2018  Hearing? N  Comment no hearing aids  Vision? N  Comment eyeglasses, goes to [facility]  Difficulty concentrating or making decisions? N  Walking or climbing stairs? N  Dressing or bathing? N  Doing errands, shopping? N  Preparing Food and eating ? N  Using the Toilet? N  In the past six months, have you accidently leaked urine? Y  Comment wears pads for protection  Do you have problems with loss of bowel control? N  Managing your Medications? N  Managing your Finances? N  Housekeeping or managing your Housekeeping? N  Some recent data might be hidden    Patient Care Team: Mikey College, NP as PCP - General (Nurse Practitioner)    Assessment:   This is a routine wellness examination for [place].  Exercise Activities and Dietary recommendations Current Exercise Habits: Home exercise routine, Type of exercise: walking, Time (Minutes): 30, Frequency (Times/Week): 3, Weekly Exercise (Minutes/Week): 90, Intensity: Mild, Exercise limited by: None identified  Goals     DIET - INCREASE WATER INTAKE      recommend drinking at least 6-8 glasses of water a day        Fall Risk: Fall Risk  10/08/2018 08/16/2018 09/18/2017 10/18/2016 08/22/2016  Falls in the past year? 0 0 No No No    Number falls in past yr: 0 0 - - -  Injury with Fall? 0 - - - -  Comment - - - - -    FALL RISK PREVENTION PERTAINING TO THE HOME:  Any stairs in or around the home? Yes  steps going in home  If so, are there any without handrails? No   Home free of loose throw rugs in walkways, pet beds, electrical cords, etc? Yes  Adequate lighting in your home to reduce risk of falls? Yes   ASSISTIVE DEVICES UTILIZED TO PREVENT FALLS:  Life alert? No  Use of a cane, walker  or w/c? No  Grab bars in the bathroom? No  Shower chair or bench in shower? No  Elevated toilet seat or a handicapped toilet? No   DME ORDERS:  DME order needed?  No   TIMED UP AND GO:  Unable to perform    Depression Screen PHQ 2/9 Scores 10/08/2018 08/16/2018 01/11/2018 09/18/2017  PHQ - 2 Score 1 4 0 2  PHQ- 9 Score 4 16 7 11      Cognitive Function     6CIT Screen 09/18/2017 08/22/2016  What Year? 0 points 0 points  What month? 0 points 0 points  What time? 0 points 0 points  Count back from 20 0 points 0 points  Months in reverse 0 points 0 points  Repeat phrase 0 points 0 points  Total Score 0 0    Immunization History  Administered Date(s) Administered   Influenza, High Dose Seasonal PF 10/29/2017, 10/01/2018   Influenza-Unspecified 11/06/2015, 10/28/2016, 10/29/2017   Pneumococcal Conjugate-13 11/09/2014   Zoster 01/03/2008    Qualifies for Shingles Vaccine? Yes  Zostavax completed 01/03/2008. Due for Shingrix. Education has been provided regarding the importance of this vaccine. Pt has been advised to call insurance company to determine out of pocket expense. Advised may also receive vaccine at local pharmacy or Health Dept. Verbalized acceptance and understanding.   Tdap: up to date   Flu Vaccine: up to date   Pneumococcal Vaccine: up to date   Screening Tests Health Maintenance  Topic Date Due   MAMMOGRAM  02/16/2020   TETANUS/TDAP  01/02/2021   COLONOSCOPY  11/28/2025    INFLUENZA VACCINE  Completed   DEXA SCAN  Completed   Hepatitis C Screening  Completed   PNA vac Low Risk Adult  Completed    Cancer Screenings:  Colorectal Screening: Completed 11/29/2015. Repeat every 10 years  Mammogram: Completed 02/15/2018. Repeat every year  Bone Density: Completed 04/17/2016.   Lung Cancer Screening: (Low Dose CT Chest recommended if Age 36-80 years, 30 pack-year currently smoking OR have quit w/in 15years.) does not qualify.    Additional Screening:  Hepatitis C Screening: does qualify; Completed 09/27/2015  Vision Screening: Recommended annual ophthalmology exams for early detection of glaucoma and other disorders of the eye. Is the patient up to date with their annual eye exam?  Yes  Who is the provider or what is the name of the office in which the pt attends annual eye exams?    Dental Screening: Recommended annual dental exams for proper oral hygiene  Community Resource Referral:  CRR required this visit?  No       Plan:  I have personally reviewed and addressed the Medicare Annual Wellness questionnaire and have noted the following in the patients chart:  A. Medical and social history B. Use of alcohol, tobacco or illicit drugs  C. Current medications and supplements D. Functional ability and status E.  Nutritional status F.  Physical activity G. Advance directives H. List of other physicians I.  Hospitalizations, surgeries, and ER visits in previous 12 months J.  [place] such as hearing and vision if needed, cognitive and depression L. Referrals and appointments   In addition, I have reviewed and discussed with patient certain preventive protocols, quality metrics, and best practice recommendations. A written personalized care plan for preventive services as well as general preventive health recommendations were provided to patient.  Signed,    Bevelyn Ngo, LPN  [id number] Nurse Health Advisor   Nurse Notes:  none

## 2019-01-06 ENCOUNTER — Other Ambulatory Visit: Payer: Self-pay | Admitting: Family Medicine

## 2019-01-06 DIAGNOSIS — G43709 Chronic migraine without aura, not intractable, without status migrainosus: Secondary | ICD-10-CM

## 2019-01-06 MED ORDER — SUMATRIPTAN SUCCINATE 100 MG PO TABS: ORAL_TABLET | ORAL | 5 refills | Status: DC

## 2019-01-06 NOTE — Addendum Note (Signed)
Addended by: Olin Hauser on: 01/06/2019 01:31 PM   Modules accepted: Orders

## 2019-01-28 ENCOUNTER — Other Ambulatory Visit: Payer: Self-pay | Admitting: Family Medicine

## 2019-01-28 DIAGNOSIS — F4310 Post-traumatic stress disorder, unspecified: Secondary | ICD-10-CM

## 2019-01-28 MED ORDER — CLONAZEPAM 0.5 MG PO TABS: ORAL_TABLET | ORAL | 2 refills | Status: DC

## 2019-03-31 ENCOUNTER — Other Ambulatory Visit: Payer: Self-pay | Admitting: Nurse Practitioner

## 2019-03-31 DIAGNOSIS — I10 Essential (primary) hypertension: Secondary | ICD-10-CM

## 2019-03-31 DIAGNOSIS — E785 Hyperlipidemia, unspecified: Secondary | ICD-10-CM

## 2019-03-31 DIAGNOSIS — F5101 Primary insomnia: Secondary | ICD-10-CM

## 2019-04-27 ENCOUNTER — Telehealth: Payer: Self-pay | Admitting: Family Medicine

## 2019-04-27 ENCOUNTER — Other Ambulatory Visit: Payer: Self-pay | Admitting: Family Medicine

## 2019-04-27 DIAGNOSIS — E785 Hyperlipidemia, unspecified: Secondary | ICD-10-CM

## 2019-04-27 DIAGNOSIS — F5101 Primary insomnia: Secondary | ICD-10-CM

## 2019-04-27 DIAGNOSIS — I10 Essential (primary) hypertension: Secondary | ICD-10-CM

## 2019-04-27 NOTE — Telephone Encounter (Signed)
Pt overdue for CPE and lab work. Called pt and LM to call office to make appt for CPE.  This medication not delegated to NT to refill.  Last refill: 03/31/18 # 30  Active medication list: yes Future visit scheduled: no Refill due: yes  Routing request to office.

## 2019-04-27 NOTE — Telephone Encounter (Signed)
Pt with several refill requests. Called pt this am and LM on VM to call and make appointment. Refused refill request.

## 2019-04-27 NOTE — Telephone Encounter (Signed)
Requested medication (s) are due for refill today: yes  Requested medication (s) are on the active medication list: yes  Last refill:  03/31/19  Future visit scheduled: no- called pt earlier in day and LM on VM to call and make appt.  Notes to clinic:  Overdue for appt, > 2 yrs since lipid panel   Requested Prescriptions  Pending Prescriptions Disp Refills   atorvastatin (LIPITOR) 20 MG tablet [Pharmacy Med Name: ATORVASTATIN 20 MG TABLET] 30 tablet 0    Sig: TAKE 1 TABLET BY MOUTH EVERY DAY      Cardiovascular:  Antilipid - Statins Failed - 04/27/2019 12:30 PM      Failed - Total Cholesterol in normal range and within 360 days    Cholesterol, Total  Date Value Ref Range Status  11/11/2014 242 (H) 100 - 199 mg/dL Final   Cholesterol  Date Value Ref Range Status  10/20/2016 130 <200 mg/dL Final          Failed - LDL in normal range and within 360 days    LDL Cholesterol (Calc)  Date Value Ref Range Status  10/20/2016 60 mg/dL (calc) Final    Comment:    Reference range: <100 . Desirable range <100 mg/dL for primary prevention;   <70 mg/dL for patients with CHD or diabetic patients  with > or = 2 CHD risk factors. Marland Kitchen LDL-C is now calculated using the Martin-Hopkins  calculation, which is a validated novel method providing  better accuracy than the Friedewald equation in the  estimation of LDL-C.  Cresenciano Genre et al. Annamaria Helling. [id number]): 2061-2068  (http://education.[facility].com/faq/FAQ164)           Failed - HDL in normal range and within 360 days    HDL  Date Value Ref Range Status  10/20/2016 52 >50 mg/dL Final  11/11/2014 54 >39 mg/dL Final    Comment:    According to ATP-III Guidelines, HDL-C >59 mg/dL is considered a negative risk factor for CHD.           Failed - Triglycerides in normal range and within 360 days    Triglycerides  Date Value Ref Range Status  10/20/2016 93 <150 mg/dL Final          Passed - Patient is not pregnant     Passed - Valid encounter within last 12 months    Recent Outpatient Visits           8 months ago Social isolation   [facility] Merrilyn Puma, Jerrel Ivory, NP   1 year ago PTSD (post-traumatic stress disorder)   [facility] Mikey College, NP   1 year ago Binge eating disorder   [facility] Mikey College, NP   1 year ago General medical exam   [facility] Olin Hauser, DO   1 year ago Binge eating disorder   [facility] Mikey College, NP

## 2019-04-28 NOTE — Telephone Encounter (Signed)
I attempted to contact the patient also, no answer. LMOM

## 2019-04-30 ENCOUNTER — Other Ambulatory Visit: Payer: Self-pay | Admitting: Family Medicine

## 2019-04-30 DIAGNOSIS — F5101 Primary insomnia: Secondary | ICD-10-CM

## 2019-05-01 ENCOUNTER — Other Ambulatory Visit: Payer: Self-pay | Admitting: Family Medicine

## 2019-05-01 DIAGNOSIS — I10 Essential (primary) hypertension: Secondary | ICD-10-CM

## 2019-05-01 NOTE — Telephone Encounter (Signed)
Requested  medications are  due for refill today yes  Requested medications are on the active medication list yes  Last refill 3/29 (curtesy)  Future visit scheduled no  Last visit more than 6 months ago  Notes to clinic Failed protocol due to no visit within 6 months and already had curtesy refill.

## 2019-05-15 ENCOUNTER — Other Ambulatory Visit: Payer: Self-pay | Admitting: Family Medicine

## 2019-05-15 DIAGNOSIS — E785 Hyperlipidemia, unspecified: Secondary | ICD-10-CM

## 2019-05-15 NOTE — Telephone Encounter (Signed)
Requested medication (s) are due for refill today:   Yes  Requested medication (s) are on the active medication list:   Yes  Future visit scheduled:   Yes 06/17/2019 with Elmyra Ricks   Last ordered: 03/31/2019  #30  0 refills  Clinic note:   Returned because had 30 day courtesy refill 3/29.  For provider review to give enough to last until her appt.    Requested Prescriptions  Pending Prescriptions Disp Refills   atorvastatin (LIPITOR) 20 MG tablet [Pharmacy Med Name: ATORVASTATIN 20 MG TABLET] 30 tablet 0    Sig: TAKE 1 TABLET BY MOUTH EVERY DAY      Cardiovascular:  Antilipid - Statins Failed - 05/15/2019  1:24 AM      Failed - Total Cholesterol in normal range and within 360 days    Cholesterol, Total  Date Value Ref Range Status  11/11/2014 242 (H) 100 - 199 mg/dL Final   Cholesterol  Date Value Ref Range Status  10/20/2016 130 <200 mg/dL Final          Failed - LDL in normal range and within 360 days    LDL Cholesterol (Calc)  Date Value Ref Range Status  10/20/2016 60 mg/dL (calc) Final    Comment:    Reference range: <100 . Desirable range <100 mg/dL for primary prevention;   <70 mg/dL for patients with CHD or diabetic patients  with > or = 2 CHD risk factors. Marland Kitchen LDL-C is now calculated using the Martin-Hopkins  calculation, which is a validated novel method providing  better accuracy than the Friedewald equation in the  estimation of LDL-C.  Cresenciano Genre et al. Annamaria Helling. [id number]): 2061-2068  (http://education.[facility].com/faq/FAQ164)           Failed - HDL in normal range and within 360 days    HDL  Date Value Ref Range Status  10/20/2016 52 >50 mg/dL Final  11/11/2014 54 >39 mg/dL Final    Comment:    According to ATP-III Guidelines, HDL-C >59 mg/dL is considered a negative risk factor for CHD.           Failed - Triglycerides in normal range and within 360 days    Triglycerides  Date Value Ref Range Status  10/20/2016 93 <150 mg/dL Final          Passed - Patient is not pregnant      Passed - Valid encounter within last 12 months    Recent Outpatient Visits           9 months ago Social isolation   [facility] Merrilyn Puma, Jerrel Ivory, NP   1 year ago PTSD (post-traumatic stress disorder)   [facility] Mikey College, NP   1 year ago Binge eating disorder   [facility] Mikey College, NP   1 year ago General medical exam   [facility] Olin Hauser, DO   2 years ago Binge eating disorder   [facility] Mikey College, NP       Future Appointments             In 1 month Malfi, Lupita Raider, [facility], [facility]

## 2019-05-22 ENCOUNTER — Other Ambulatory Visit: Payer: Self-pay | Admitting: Family Medicine

## 2019-05-22 DIAGNOSIS — I10 Essential (primary) hypertension: Secondary | ICD-10-CM

## 2019-05-22 NOTE — Telephone Encounter (Signed)
Appointment 6/15- RF #30

## 2019-05-24 ENCOUNTER — Other Ambulatory Visit: Payer: Self-pay | Admitting: Family Medicine

## 2019-05-24 DIAGNOSIS — I10 Essential (primary) hypertension: Secondary | ICD-10-CM

## 2019-05-25 ENCOUNTER — Other Ambulatory Visit: Payer: Self-pay | Admitting: Family Medicine

## 2019-05-25 DIAGNOSIS — E785 Hyperlipidemia, unspecified: Secondary | ICD-10-CM

## 2019-05-25 NOTE — Telephone Encounter (Signed)
Requested medication (s) are due for refill today: no  Requested medication (s) are on the active medication list: yes  Last refill:  05/15/19  Future visit scheduled: yes 06/07/19  Notes to clinic:  Please advise with refill amount- pt has upcoming appt-last refill will not be enough to last until appt   Requested Prescriptions  Pending Prescriptions Disp Refills   atorvastatin (LIPITOR) 20 MG tablet [Pharmacy Med Name: ATORVASTATIN 20 MG TABLET] 90 tablet 1    Sig: TAKE 1 TABLET BY MOUTH EVERY DAY      Cardiovascular:  Antilipid - Statins Failed - 05/25/2019  1:59 PM      Failed - Total Cholesterol in normal range and within 360 days    Cholesterol, Total  Date Value Ref Range Status  11/11/2014 242 (H) 100 - 199 mg/dL Final   Cholesterol  Date Value Ref Range Status  10/20/2016 130 <200 mg/dL Final          Failed - LDL in normal range and within 360 days    LDL Cholesterol (Calc)  Date Value Ref Range Status  10/20/2016 60 mg/dL (calc) Final    Comment:    Reference range: <100 . Desirable range <100 mg/dL for primary prevention;   <70 mg/dL for patients with CHD or diabetic patients  with > or = 2 CHD risk factors. Marland Kitchen LDL-C is now calculated using the Martin-Hopkins  calculation, which is a validated novel method providing  better accuracy than the Friedewald equation in the  estimation of LDL-C.  Cresenciano Genre et al. Annamaria Helling. [id number]): 2061-2068  (http://education.[facility].com/faq/FAQ164)           Failed - HDL in normal range and within 360 days    HDL  Date Value Ref Range Status  10/20/2016 52 >50 mg/dL Final  11/11/2014 54 >39 mg/dL Final    Comment:    According to ATP-III Guidelines, HDL-C >59 mg/dL is considered a negative risk factor for CHD.           Failed - Triglycerides in normal range and within 360 days    Triglycerides  Date Value Ref Range Status  10/20/2016 93 <150 mg/dL Final          Passed - Patient is not pregnant     Passed - Valid encounter within last 12 months    Recent Outpatient Visits           9 months ago Social isolation   [facility] Merrilyn Puma, Jerrel Ivory, NP   1 year ago PTSD (post-traumatic stress disorder)   [facility] Mikey College, NP   1 year ago Binge eating disorder   [facility] Mikey College, NP   1 year ago General medical exam   [facility] Olin Hauser, DO   2 years ago Binge eating disorder   [facility] Mikey College, NP       Future Appointments             In 3 weeks Malfi, Lupita Raider, [facility], [facility]

## 2019-06-17 ENCOUNTER — Encounter: Payer: Self-pay | Admitting: Family Medicine

## 2019-06-17 ENCOUNTER — Ambulatory Visit: Payer: Medicare PPO | Admitting: Family Medicine

## 2019-06-17 ENCOUNTER — Other Ambulatory Visit: Payer: Self-pay

## 2019-06-17 VITALS — BP 131/70 | HR 80 | Temp 97.3°F | Ht 62.0 in | Wt 177.4 lb

## 2019-06-17 DIAGNOSIS — E782 Mixed hyperlipidemia: Secondary | ICD-10-CM | POA: Diagnosis not present

## 2019-06-17 DIAGNOSIS — F5104 Psychophysiologic insomnia: Secondary | ICD-10-CM

## 2019-06-17 DIAGNOSIS — Z79899 Other long term (current) drug therapy: Secondary | ICD-10-CM

## 2019-06-17 DIAGNOSIS — I10 Essential (primary) hypertension: Secondary | ICD-10-CM | POA: Diagnosis not present

## 2019-06-17 DIAGNOSIS — G43709 Chronic migraine without aura, not intractable, without status migrainosus: Secondary | ICD-10-CM | POA: Diagnosis not present

## 2019-06-17 DIAGNOSIS — F4310 Post-traumatic stress disorder, unspecified: Secondary | ICD-10-CM | POA: Diagnosis not present

## 2019-06-17 DIAGNOSIS — F419 Anxiety disorder, unspecified: Secondary | ICD-10-CM | POA: Diagnosis not present

## 2019-06-17 MED ORDER — TRAZODONE HCL 50 MG PO TABS: 25.0000 mg | ORAL_TABLET | Freq: Every evening | ORAL | 3 refills | Status: DC | PRN

## 2019-06-17 MED ORDER — SUMATRIPTAN SUCCINATE 100 MG PO TABS: ORAL_TABLET | ORAL | 5 refills | Status: DC

## 2019-06-17 MED ORDER — CLONAZEPAM 0.5 MG PO TABS: ORAL_TABLET | ORAL | 2 refills | Status: DC

## 2019-06-17 NOTE — Progress Notes (Signed)
Subjective:    Patient ID: Kathy Morton, female    DOB: Aug 06, 1945, 74 y.o.   MRN: [id number]  Kathy Morton is a 74 y.o. female presenting on 06/17/2019 for Hypertension and Anxiety   HPI  Hypertension - She is not checking BP at home or outside of clinic.    - Current medications: lisinopril 10mg  daily, tolerating well without side effects - She is not currently symptomatic. - Pt denies headache, lightheadedness, dizziness, changes in vision, chest tightness/pressure, palpitations, leg swelling, sudden loss of speech or loss of consciousness. - She  reports no regular exercise routine. - Her diet is high in salt, high in fat, and high in carbohydrates.  Kathy Morton is here for a follow up on her anxiety and insomnia.  Reports good control with her current treatment regimen of clonazepam and has been having nights of insomnia with the seroquel.  Finds that she can sleep for 2-3 nights and then will be unable to sleep for a few nights.  Is interested in looking at changing medications.  No other acute concerns today.  Depression screen [facility] 2/9 06/17/2019 10/08/2018 08/16/2018  Decreased Interest 0 0 2  Down, Depressed, Hopeless 0 1 2  PHQ - 2 Score 0 1 4  Altered sleeping 3 1 3   Tired, decreased energy 0 0 3  Change in appetite 3 2 3   Feeling bad or failure about yourself  1 0 3  Trouble concentrating 0 0 0  Moving slowly or fidgety/restless 0 0 0  Suicidal thoughts 0 0 0  PHQ-9 Score 7 4 16   Difficult doing work/chores Not difficult at all Not difficult at all Not difficult at all  Some recent data might be hidden    Social History   Tobacco Use  . Smoking status: Never Smoker  . Smokeless tobacco: Never Used  Vaping Use  . Vaping Use: Never used  Substance Use Topics  . Alcohol use: No  . Drug use: No    Review of Systems  Constitutional: Negative.   HENT: Negative.   Eyes: Negative.   Respiratory: Negative.   Cardiovascular: Negative.   Gastrointestinal:  Negative.   Endocrine: Negative.   Genitourinary: Negative.   Musculoskeletal: Negative.   Skin: Negative.   Allergic/Immunologic: Negative.   Neurological: Negative.   Hematological: Negative.   Psychiatric/Behavioral: Positive for sleep disturbance. Negative for agitation, behavioral problems, confusion, decreased concentration, dysphoric mood, hallucinations, self-injury and suicidal ideas. The patient is not nervous/anxious and is not hyperactive.    Per HPI unless specifically indicated above     Objective:    BP 131/70 (BP Location: Left Arm, Patient Position: Sitting, Cuff Size: Small)   Pulse 80   Temp (!) 97.3 F (36.3 C) (Temporal)   Ht 5\' 2"  (1.575 m)   Wt 177 lb 6.4 oz (80.5 kg)   BMI 32.45 kg/m   Wt Readings from Last 3 Encounters:  06/17/19 177 lb 6.4 oz (80.5 kg)  10/08/18 178 lb (80.7 kg)  01/11/18 175 lb 3.2 oz (79.5 kg)    Physical Exam Vitals reviewed.  Constitutional:      General: She is not in acute distress.    Appearance: Normal appearance. She is well-developed and well-groomed. She is obese. She is not ill-appearing or toxic-appearing.  HENT:     Head: Normocephalic and atraumatic.     Nose:     Comments: Lizbeth Bark is in place, covering mouth and nose  Eyes:     General: Lids  are normal. Vision grossly intact.        Right eye: No discharge.        Left eye: No discharge.     Extraocular Movements: Extraocular movements intact.     Conjunctiva/sclera: Conjunctivae normal.     Pupils: Pupils are equal, round, and reactive to light.  Cardiovascular:     Rate and Rhythm: Normal rate and regular rhythm.     Pulses: Normal pulses.     Heart sounds: Normal heart sounds. No murmur heard.  No friction rub. No gallop.   Pulmonary:     Effort: Pulmonary effort is normal. No respiratory distress.     Breath sounds: Normal breath sounds.  Musculoskeletal:     Right lower leg: No edema.     Left lower leg: No edema.  Skin:    General: Skin is warm  and dry.     Capillary Refill: Capillary refill takes less than 2 seconds.  Neurological:     General: No focal deficit present.     Mental Status: She is alert and oriented to person, place, and time.     Cranial Nerves: No cranial nerve deficit.     Sensory: No sensory deficit.     Motor: No weakness.     Coordination: Coordination normal.     Gait: Gait normal.  Psychiatric:        Attention and Perception: Attention and perception normal.        Mood and Affect: Mood and affect normal.        Speech: Speech normal.        Behavior: Behavior normal. Behavior is cooperative.        Thought Content: Thought content normal.        Cognition and Memory: Cognition and memory normal.        Judgment: Judgment normal.    Results for orders placed or performed in visit on 10/18/16  Lipid panel  Result Value Ref Range   Cholesterol 130 <200 mg/dL   HDL 52 >50 mg/dL   Triglycerides 93 <150 mg/dL   LDL Cholesterol (Calc) 60 mg/dL (calc)   Total CHOL/HDL Ratio 2.5 <5.0 (calc)   Non-HDL Cholesterol (Calc) 78 <130 mg/dL (calc)  Comprehensive metabolic panel  Result Value Ref Range   Glucose, Bld 84 65 - 99 mg/dL   BUN 18 7 - 25 mg/dL   Creat 0.97 (H) 0.60 - 0.93 mg/dL   BUN/Creatinine Ratio 19 6 - 22 (calc)   Sodium 140 135 - 146 mmol/L   Potassium 4.0 3.5 - 5.3 mmol/L   Chloride 105 98 - 110 mmol/L   CO2 26 20 - 32 mmol/L   Calcium 10.8 (H) 8.6 - 10.4 mg/dL   Total Protein 6.7 6.1 - 8.1 g/dL   Albumin 4.3 3.6 - 5.1 g/dL   Globulin 2.4 1.9 - 3.7 g/dL (calc)   AG Ratio 1.8 1.0 - 2.5 (calc)   Total Bilirubin 0.4 0.2 - 1.2 mg/dL   Alkaline phosphatase (APISO) 89 33 - 130 U/L   AST 15 10 - 35 U/L   ALT 11 6 - 29 U/L  Hemoglobin A1c  Result Value Ref Range   Hgb A1c MFr Bld 5.4 <5.7 % of total Hgb   Mean Plasma Glucose 108 (calc)   eAG (mmol/L) 6.0 (calc)      Assessment & Plan:   Problem List Items Addressed This Visit      Cardiovascular and Mediastinum   Migraines  Relevant Medications   SUMAtriptan (IMITREX) 100 MG tablet   traZODone (DESYREL) 50 MG tablet   clonazePAM (KLONOPIN) 0.5 MG tablet   Hypertension    Controlled hypertension.  BP is just above goal < 130/80.  Pt is working on lifestyle modifications.  Taking medications tolerating well without side effects. No known complications.  Plan: 1. Continue taking lisinopril 10mg  daily 2. Obtain labs ordered today 3. Encouraged heart healthy diet and increasing exercise to 30 minutes most days of the week, going no more than 2 days in a row without exercise. 4. Check BP 1-2 x per week at home, keep log, and bring to clinic at next appointment. 5. Follow up 3 months.         Relevant Orders   CBC with Differential   COMPLETE METABOLIC PANEL WITH GFR     Other   PTSD (post-traumatic stress disorder)   Relevant Medications   traZODone (DESYREL) 50 MG tablet   clonazePAM (KLONOPIN) 0.5 MG tablet   Hyperlipidemia    Status unknown.  Recheck labs.  Continue meds without changes today.  Refills provided. Followup after labs.       Relevant Orders   Lipid Profile   Insomnia - Primary    Has been taking seroquel 100mg  daily at bedtime.  Finds is sleeping for 2-3 nights and then will have 1-2 nights without being able to sleep. Would like to switch medications.  Discussed nightly medication such as Dayvigo vs Trazodone.  Patient interested in Trazodone.  Plan: 1. Stop seroquel and BEGIN Trazodone 25-50mg  nightly as needed with sleep 2. Sleep hygiene handout provided 3. Follow up in 3 months      Relevant Medications   traZODone (DESYREL) 50 MG tablet   Anxiety    Chronic and stable anxiety with taking 0.5mg  clonazepam 1-2x per day as needed for symptoms.  Has been on this medication for a long while with good management of her symptoms.  PDMP reviewed.  Plan: 1. Mood handout provided 2. Clonazepam refill sent to pharmacy on file.      Relevant Medications   traZODone (DESYREL) 50  MG tablet    Other Visit Diagnoses    Long-term use of high-risk medication       Relevant Orders   Thyroid Panel With TSH   Hypercalcemia       Relevant Orders   PTH-related peptide   PTH, Intact and Calcium   Vitamin D 1,25 dihydroxy   Vitamin D (25 hydroxy)      Meds ordered this encounter  Medications  . SUMAtriptan (IMITREX) 100 MG tablet    Sig: May repeat in 2 hours if headache persists or recurs.    Dispense:  9 tablet    Refill:  5    G43.709  . traZODone (DESYREL) 50 MG tablet    Sig: Take 0.5-1 tablets (25-50 mg total) by mouth at bedtime as needed for sleep.    Dispense:  30 tablet    Refill:  3  . clonazePAM (KLONOPIN) 0.5 MG tablet    Sig: 1 TABLET BY MOUTH TWICE A DAY AS NEEDED FOR ANXIETY    Dispense:  45 tablet    Refill:  2      Follow up plan: Return in about 3 months (around 09/17/2019) for Insomnia, HTN & Anxiety F/U.   Harlin Rain, [place] [facility] Nurse Practitioner [facility] 06/17/2019, 11:28 AM

## 2019-06-17 NOTE — Assessment & Plan Note (Signed)
Has been taking seroquel 100mg  daily at bedtime.  Finds is sleeping for 2-3 nights and then will have 1-2 nights without being able to sleep. Would like to switch medications.  Discussed nightly medication such as Dayvigo vs Trazodone.  Patient interested in Trazodone.  Plan: 1. Stop seroquel and BEGIN Trazodone 25-50mg  nightly as needed with sleep 2. Sleep hygiene handout provided 3. Follow up in 3 months

## 2019-06-17 NOTE — Assessment & Plan Note (Signed)
Chronic and stable anxiety with taking 0.5mg  clonazepam 1-2x per day as needed for symptoms.  Has been on this medication for a long while with good management of her symptoms.  PDMP reviewed.  Plan: 1. Mood handout provided 2. Clonazepam refill sent to pharmacy on file.

## 2019-06-17 NOTE — Patient Instructions (Addendum)
Your medication refills have been sent to your pharmacy on file.  I have sent in a prescription for Trazodone to take 25-62m at bedtime for help with sleep.  I have also added information regarding sleep hygiene to your paperwork (see below).  As we discussed, have your labs drawn in the next 1-2 weeks and we will contact you with the results.  Try to get exercise a minimum of 30 minutes per day at least 5 days per week as well as  adequate water intake all while measuring blood pressure a few times per week.  Keep a blood pressure log and bring back to clinic at your next visit.  If your readings are consistently over 130/80 to contact our office/send me a MyChart message and we will see you sooner.  Can try DASH and Mediterranean diet options, avoiding processed foods, lowering sodium intake, avoiding pork products, and eating a plant based diet for optimal health.  The following recommendations are helpful adjuncts for helping rebalance your mood.  Eat a nourishing diet. Ensure adequate intake of calories, protein, carbs, fat, vitamins, and minerals. Prioritize whole foods at each meal, including meats, vegetables, fruits, nuts and seeds, etc.   Avoid inflammatory and/or "junk" foods, such as sugar, omega-6 fats, refined grains, chemicals, and preservatives are common in packaged and prepared foods. Minimize or completely avoid these ingredients and stick to whole foods with little to no additives. Cook from scratch as much as possible for more control over what you eat  Get enough sleep. Poor sleep is significantly associated with depression and anxiety. Make 7-9 hours of sleep nightly a top priority  Exercise appropriately. Exercise is known to improve brain functioning and boost mood. Aim for 30 minutes of daily physical activity. Avoid "overtraining," which can cause mental disturbances  Assess your light exposure. Not enough natural light during the day and too much artificial light can  have a major impact on your mood. Get outside as often as possible during daylight hours. Minimize light exposure after dark and avoid the use of electronics that give off blue light before bed  Manage your stress.  Use daily stress management techniques such as meditation, yoga, or mindfulness to retrain your brain to respond differently to stress. Try deep breathing to deactivate your "fight or flight" response.  There are many of sources with apps like Headspace, Calm or a variety of YouTube videos (videos from JGwynne Edingerhave guided meditation)  Prioritize your social life. Work on building social support with new friends or improve current relationships. Consider getting a pet that allows for companionship, social interaction, and physical touch. Try volunteering or joining a faith-based community to increase your sense of purpose  4-7-8 breathing technique at bedtime: breathe in to count of 4, hold breath for count of 7, exhale for count of 8; do 3-5 times for letting go of overactive thoughts  Take time to play Unstructured "play" time can help reduce anxiety and depression Options for play include music, games, sports, dance, art, etc.  Try to add daily omega 3 fatty acids, magnesium, B complex, and balanced amino acid supplements to help improve mood and anxiety.  Sleep hygiene is the single most effective treatment for sleep issues, but it is hard work.  Tips for a good night's sleep:  -Keep sleep environment comfortable and conducive to sleep -Keep regular sleep schedule 7 nights a week -Avoiding naps during the day -Avoiding going to bed until drowsy and ready to sleep, not trying  to sleep, and not watching the clock -Get out of bed if not asleep within 15-20 minutes and returning only when drowsy -Avoiding caffeine, nicotine, alcohol, and other substances that interfere with sleep before bedtime -Take an hour before your set bedtime and start to wind down: bath/shower, no  more TV or phone (the blue light can interfere with sleeping), listen to soothing music, or meditation -No TV in your bedroom -Exercising regularly, at least 6 hours before sleep. Yoga and Tai Chi can improve sleep quality  There are a lot of books and apps that may help guide you with any of the following:   -Progressive muscle relaxation (involves methodical tension and relaxation of different Muscle groups throughout body)  Guided imagery  -YouTube - Gwynne Edinger has free videos on YouTube that can help with meditation and some   Abdominal breathing   Over the counter sleep aid one hour before bed- and gradually wean your use over 2-4 weeks  Some examples are : *Melatonin 5-10 mg *Sleepology (Can find on [employer]) taken according to packaging directions  There are a few online evidence based online programs, unfortunately they are not free.   Developed by a sleep expert who created a drug-free program for insomnia proven more effective than sleeping pills.  www.cbtforinsomnia.com Sleepio is an evidence-based digital sleep improvement program   www.sleepio.com SHUTi is designed to actively help retrain your body and mind for great sleep through six engaging Cognitive Behavioral Therapy for Insomnia strategy and learning sessions  [email or web address]   We will plan to see you back in 3 months for insomnia follow up and hypertension/anxiety f/u  You will receive a survey after today's visit either digitally by e-mail or paper by [place] mail. Your experiences and feedback matter to [place].  Please respond so we know how we are doing as we provide care for you.  Call us with any questions/concerns/needs.  It is my goal to be available to you for your health concerns.  Thanks for choosing me to be a partner in your healthcare needs!  Harlin Rain, FNP-C Family Nurse Practitioner [facility] Phone: [phone]

## 2019-06-17 NOTE — Assessment & Plan Note (Signed)
Status unknown.  Recheck labs.  Continue meds without changes today.  Refills provided. Followup after labs.  

## 2019-06-17 NOTE — Assessment & Plan Note (Signed)
Controlled hypertension.  BP is just above goal < 130/80.  Pt is working on lifestyle modifications.  Taking medications tolerating well without side effects. No known complications.  Plan: 1. Continue taking lisinopril 10mg  daily 2. Obtain labs ordered today 3. Encouraged heart healthy diet and increasing exercise to 30 minutes most days of the week, going no more than 2 days in a row without exercise. 4. Check BP 1-2 x per week at home, keep log, and bring to clinic at next appointment. 5. Follow up 3 months.

## 2019-07-15 ENCOUNTER — Other Ambulatory Visit: Payer: Self-pay | Admitting: Family Medicine

## 2019-07-15 DIAGNOSIS — F5104 Psychophysiologic insomnia: Secondary | ICD-10-CM

## 2019-07-15 NOTE — Telephone Encounter (Signed)
Requested medication (s) are due for refill today: Early  Requested medication (s) are on the active medication list: Yes  Last refill:  06/17/19  Future visit scheduled: No  Notes to clinic:  Pharmacy asking for 90 day supply and diagnosis code.    Requested Prescriptions  Pending Prescriptions Disp Refills   traZODone (DESYREL) 50 MG tablet [Pharmacy Med Name: TRAZODONE 50 MG TABLET] 90 tablet 2    Sig: Take 0.5-1 tablets (25-50 mg total) by mouth at bedtime as needed for sleep.      Psychiatry: Antidepressants - Serotonin Modulator Passed - 07/15/2019 10:33 AM      Passed - Completed PHQ-2 or PHQ-9 in the last 360 days.      Passed - Valid encounter within last 6 months    Recent Outpatient Visits           4 weeks ago Psychophysiological insomnia   [place], [place]   11 months ago Social isolation   [facility]ily[facility] Puma, Jerrel Ivory, NP   1 year ago PTSD (post-traumatic stress disorder)   [facility] Mikey College, NP   1 year ago Binge eating disorder   [facility] Merrilyn Puma, Jerrel Ivory, NP   1 year ago General medical exam   [place], DO

## 2019-09-15 ENCOUNTER — Other Ambulatory Visit: Payer: Self-pay | Admitting: Family Medicine

## 2019-09-15 DIAGNOSIS — F4310 Post-traumatic stress disorder, unspecified: Secondary | ICD-10-CM

## 2019-09-15 NOTE — Telephone Encounter (Signed)
Requested medications are due for refill today?  Yes - This medication refill cannot be delegated.    Requested medications are on active medication list?  Yes  Last Refill:   06/17/2019  # 45 with 2 refills  Future visit scheduled?  No   Notes to Clinic:  This medication refill cannot be delegated.

## 2019-09-16 NOTE — Telephone Encounter (Signed)
Personally reviewed [place] today, 09/16/19.  According to PMP aware, pt last received a refill on 08/17/2019.  Refill of her controlled substance will be provided today.

## 2019-09-19 ENCOUNTER — Other Ambulatory Visit: Payer: Self-pay | Admitting: Family Medicine

## 2019-09-19 DIAGNOSIS — F5101 Primary insomnia: Secondary | ICD-10-CM

## 2019-09-19 NOTE — Telephone Encounter (Signed)
Requested medication (s) are due for refill today: no  Requested medication (s) are on the active medication list: no  Last refill:  03/31/19  Future visit scheduled: no  Notes to clinic:  not delegated    Requested Prescriptions  Pending Prescriptions Disp Refills   QUEtiapine (SEROQUEL) 100 MG tablet [Pharmacy Med Name: QUETIAPINE FUMARATE 100 MG TAB] 30 tablet 0    Sig: TAKE 1 TABLET BY MOUTH EVERYDAY AT BEDTIME      Not Delegated - Psychiatry:  Antipsychotics - Second Generation (Atypical) - quetiapine Failed - 09/19/2019  1:50 PM      Failed - This refill cannot be delegated      Failed - ALT in normal range and within 180 days    ALT  Date Value Ref Range Status  10/20/2016 11 6 - 29 U/L Final          Failed - AST in normal range and within 180 days    AST  Date Value Ref Range Status  10/20/2016 15 10 - 35 U/L Final          Passed - Completed PHQ-2 or PHQ-9 in the last 360 days.      Passed - Last BP in normal range    BP Readings from Last 1 Encounters:  06/17/19 131/70          Passed - Valid encounter within last 6 months    Recent Outpatient Visits           3 months ago Psychophysiological insomnia   [place], FNP   1 year ago Social isolation   [facility] Merrilyn Puma, Jerrel Ivory, NP   1 year ago PTSD (post-traumatic stress disorder)   [facility] Mikey College, NP   2 years ago Binge eating disorder   [facility] Merrilyn Puma, Jerrel Ivory, NP   2 years ago General medical exam   [place], DO

## 2019-10-12 ENCOUNTER — Other Ambulatory Visit: Payer: Self-pay | Admitting: Family Medicine

## 2019-10-12 DIAGNOSIS — F5101 Primary insomnia: Secondary | ICD-10-CM

## 2019-10-12 NOTE — Telephone Encounter (Signed)
Requested medications are due for refill today?  Yes - This medication refill cannot be delegated.    Requested medications are on active medication list? Yes  Last Refill:  09/19/2019  # 30 with no refills  Future visit scheduled? No   Notes to Clinic:  This medication refill cannot be delegated.

## 2019-11-04 ENCOUNTER — Ambulatory Visit (INDEPENDENT_AMBULATORY_CARE_PROVIDER_SITE_OTHER): Payer: Medicare PPO

## 2019-11-04 VITALS — Ht 64.0 in | Wt 170.0 lb

## 2019-11-04 DIAGNOSIS — Z0000 Encounter for general adult medical examination without abnormal findings: Secondary | ICD-10-CM | POA: Diagnosis not present

## 2019-11-04 NOTE — Patient Instructions (Signed)
Kathy Morton , Thank you for taking time to come for your Medicare Wellness Visit. I appreciate your ongoing commitment to your health goals. Please review the following plan we discussed and let me know if I can assist you in the future.   Screening recommendations/referrals: Colonoscopy: completed 11/29/2015, due 11/28/2025 Mammogram: not required Bone Density: completed 04/17/2016 Recommended yearly ophthalmology/optometry visit for glaucoma screening and checkup Recommended yearly dental visit for hygiene and checkup  Vaccinations: Influenza vaccine: completed 10/14/2019, due 08/02/2020 Pneumococcal vaccine: completed 11/09/2014 Tdap vaccine: completed 07/17/2011, due 07/16/2021 Shingles vaccine: completed   Covid-19: 02/19/2019, 03/12/2019, 09/30/2019  Advanced directives: Please bring a copy of your POA (Power of Attorney) and/or Living Will to your next appointment.   Conditions/risks identified: none  Next appointment: Follow up in one year for your annual wellness visit    Preventive Care 65 Years and Older, Female Preventive care refers to lifestyle choices and visits with your health care provider that can promote health and wellness. What does preventive care include?  A yearly physical exam. This is also called an annual well check.  Dental exams once or twice a year.  Routine eye exams. Ask your health care provider how often you should have your eyes checked.  Personal lifestyle choices, including:  Daily care of your teeth and gums.  Regular physical activity.  Eating a healthy diet.  Avoiding tobacco and drug use.  Limiting alcohol use.  Practicing safe sex.  Taking low-dose aspirin every day.  Taking vitamin and mineral supplements as recommended by your health care provider. What happens during an annual well check? The services and screenings done by your health care provider during your annual well check will depend on your age, overall health, lifestyle  risk factors, and family history of disease. Counseling  Your health care provider may ask you questions about your:  Alcohol use.  Tobacco use.  Drug use.  Emotional well-being.  Home and relationship well-being.  Sexual activity.  Eating habits.  History of falls.  Memory and ability to understand (cognition).  Work and work Statistician.  Reproductive health. Screening  You may have the following tests or measurements:  Height, weight, and BMI.  Blood pressure.  Lipid and cholesterol levels. These may be checked every 5 years, or more frequently if you are over 18 years old.  Skin check.  Lung cancer screening. You may have this screening every year starting at age 64 if you have a 30-pack-year history of smoking and currently smoke or have quit within the past 15 years.  Fecal occult blood test (FOBT) of the stool. You may have this test every year starting at age 69.  Flexible sigmoidoscopy or colonoscopy. You may have a sigmoidoscopy every 5 years or a colonoscopy every 10 years starting at age 3.  Hepatitis C blood test.  Hepatitis B blood test.  Sexually transmitted disease (STD) testing.  Diabetes screening. This is done by checking your blood sugar (glucose) after you have not eaten for a while (fasting). You may have this done every 1-3 years.  Bone density scan. This is done to screen for osteoporosis. You may have this done starting at age 51.  Mammogram. This may be done every 1-2 years. Talk to your health care provider about how often you should have regular mammograms. Talk with your health care provider about your test results, treatment options, and if necessary, the need for more tests. Vaccines  Your health care provider may recommend certain vaccines, such as:  Influenza vaccine. This is recommended every year.  Tetanus, diphtheria, and acellular pertussis (Tdap, Td) vaccine. You may need a Td booster every 10 years.  Zoster vaccine.  You may need this after age 33.  Pneumococcal 13-valent conjugate (PCV13) vaccine. One dose is recommended after age 15.  Pneumococcal polysaccharide (PPSV23) vaccine. One dose is recommended after age 29. Talk to your health care provider about which screenings and vaccines you need and how often you need them. This information is not intended to replace advice given to you by your health care provider. Make sure you discuss any questions you have with your health care provider. Document Released: 01/15/2015 Document Revised: 09/08/2015 Document Reviewed: 10/20/2014 Elsevier Interactive Patient Education  2017 [employer] Prevention in the Home Falls can cause injuries. They can happen to people of all ages. There are many things you can do to make your home safe and to help prevent falls. What can I do on the outside of my home?  Regularly fix the edges of walkways and driveways and fix any cracks.  Remove anything that might make you trip as you walk through a door, such as a raised step or threshold.  Trim any bushes or trees on the path to your home.  Use bright outdoor lighting.  Clear any walking paths of anything that might make someone trip, such as rocks or tools.  Regularly check to see if handrails are loose or broken. Make sure that both sides of any steps have handrails.  Any raised decks and porches should have guardrails on the edges.  Have any leaves, snow, or ice cleared regularly.  Use sand or salt on walking paths during winter.  Clean up any spills in your garage right away. This includes oil or grease spills. What can I do in the bathroom?  Use night lights.  Install grab bars by the toilet and in the tub and shower. Do not use towel bars as grab bars.  Use non-skid mats or decals in the tub or shower.  If you need to sit down in the shower, use a plastic, non-slip stool.  Keep the floor dry. Clean up any water that spills on the floor as soon  as it happens.  Remove soap buildup in the tub or shower regularly.  Attach bath mats securely with double-sided non-slip rug tape.  Do not have throw rugs and other things on the floor that can make you trip. What can I do in the bedroom?  Use night lights.  Make sure that you have a light by your bed that is easy to reach.  Do not use any sheets or blankets that are too big for your bed. They should not hang down onto the floor.  Have a firm chair that has side arms. You can use this for support while you get dressed.  Do not have throw rugs and other things on the floor that can make you trip. What can I do in the kitchen?  Clean up any spills right away.  Avoid walking on wet floors.  Keep items that you use a lot in easy-to-reach places.  If you need to reach something above you, use a strong step stool that has a grab bar.  Keep electrical cords out of the way.  Do not use floor polish or wax that makes floors slippery. If you must use wax, use non-skid floor wax.  Do not have throw rugs and other things on the floor that  can make you trip. What can I do with my stairs?  Do not leave any items on the stairs.  Make sure that there are handrails on both sides of the stairs and use them. Fix handrails that are broken or loose. Make sure that handrails are as long as the stairways.  Check any carpeting to make sure that it is firmly attached to the stairs. Fix any carpet that is loose or worn.  Avoid having throw rugs at the top or bottom of the stairs. If you do have throw rugs, attach them to the floor with carpet tape.  Make sure that you have a light switch at the top of the stairs and the bottom of the stairs. If you do not have them, ask someone to add them for you. What else can I do to help prevent falls?  Wear shoes that:  Do not have high heels.  Have rubber bottoms.  Are comfortable and fit you well.  Are closed at the toe. Do not wear sandals.  If  you use a stepladder:  Make sure that it is fully opened. Do not climb a closed stepladder.  Make sure that both sides of the stepladder are locked into place.  Ask someone to hold it for you, if possible.  Clearly mark and make sure that you can see:  Any grab bars or handrails.  First and last steps.  Where the edge of each step is.  Use tools that help you move around (mobility aids) if they are needed. These include:  Canes.  Walkers.  Scooters.  Crutches.  Turn on the lights when you go into a dark area. Replace any light bulbs as soon as they burn out.  Set up your furniture so you have a clear path. Avoid moving your furniture around.  If any of your floors are uneven, fix them.  If there are any pets around you, be aware of where they are.  Review your medicines with your doctor. Some medicines can make you feel dizzy. This can increase your chance of falling. Ask your doctor what other things that you can do to help prevent falls. This information is not intended to replace advice given to you by your health care provider. Make sure you discuss any questions you have with your health care provider. Document Released: 10/15/2008 Document Revised: 05/27/2015 Document Reviewed: 01/23/2014 Elsevier Interactive Patient Education  2017 [employer].

## 2019-11-04 NOTE — Progress Notes (Signed)
I connected with Kathy Morton today by telephone and verified that I am speaking with the correct person using two identifiers. Location patient: home Location provider: work Persons participating in the virtual visit: Charlett Merkle, Glenna Durand LPN.   I discussed the limitations, risks, security and privacy concerns of performing an evaluation and management service by telephone and the availability of in person appointments. I also discussed with the patient that there may be a patient responsible charge related to this service. The patient expressed understanding and verbally consented to this telephonic visit.    Interactive audio and video telecommunications were attempted between this provider and patient, however failed, due to patient having technical difficulties OR patient did not have access to video capability.  We continued and completed visit with audio only.     Vital signs may be patient reported or missing.  Subjective:   Kathy Morton is a 74 y.o. female who presents for Medicare Annual (Subsequent) preventive examination.  Review of Systems     Cardiac Risk Factors include: advanced age (>53men, >53 women)     Objective:    Today's Vitals   11/04/19 1542  Weight: 170 lb (77.1 kg)  Height: 5\' 4"  (1.626 m)   Body mass index is 29.18 kg/m.  Advanced Directives 11/04/2019 09/18/2017 08/22/2016 11/29/2015  Does Patient Have a Medical Advance Directive? Yes Yes Yes No  Type of Paramedic of [place];Living will Living will;Healthcare Power of [place];Living will -  Copy of [place] in Chart? No - copy requested No - copy requested No - copy requested -  Would patient like information on creating a medical advance directive? - - - No - Patient declined    Current Medications (verified) Outpatient Encounter Medications as of 11/04/2019  Medication Sig  . atorvastatin (LIPITOR) 20 MG  tablet TAKE 1 TABLET BY MOUTH EVERY DAY  . clonazePAM (KLONOPIN) 0.5 MG tablet TAKE 1 TABLET BY MOUTH TWICE A DAY AS NEEDED FOR ANXIETY  . lisinopril (ZESTRIL) 10 MG tablet TAKE 1 TABLET BY MOUTH EVERY DAY  . polyethylene glycol powder (GLYCOLAX/MIRALAX) 17 GM/SCOOP powder Use as directed  . QUEtiapine (SEROQUEL) 100 MG tablet TAKE 1 TABLET BY MOUTH EVERYDAY AT BEDTIME  . SUMAtriptan (IMITREX) 100 MG tablet May repeat in 2 hours if headache persists or recurs.  . traZODone (DESYREL) 50 MG tablet Take 0.5-1 tablets (25-50 mg total) by mouth at bedtime as needed for sleep. (Patient not taking: Reported on 11/04/2019)   No facility-administered encounter medications on file as of 11/04/2019.    Allergies (verified) Patient has no known allergies.   History: Past Medical History:  Diagnosis Date  . Anxiety   . Depression   . DVT of leg (deep venous thrombosis) ([place])   . Dysuria   . History of blood clots   . Hypertension   . Incontinence   . Migraine   . Recurrent UTI    Past Surgical History:  Procedure Laterality Date  . 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 COLONOSCOPY WITH PROPOFOL N/A 11/29/2015   Procedure: COLONOSCOPY WITH PROPOFOL;  Surgeon: Jonathon Bellows, MD;  Location: [facility] ENDOSCOPY;  Service: Endoscopy;  Laterality: N/A;  . KNEE SURGERY Left 2010   torn meniscus  . TONSILECTOMY, ADENOIDECTOMY, BILATERAL MYRINGOTOMY AND TUBES     Family History  Problem Relation Age of Onset  . Bladder Cancer Mother   . Heart attack Father   .  Breast cancer Maternal Grandmother 80  . Kidney disease Neg Hx    Social History   Socioeconomic History  . Marital status: Divorced    Spouse name: Not on file  . Number of children: Not on file  . Years of education: Not on file  . Highest education level: Some college, no degree  Occupational History  . Occupation: retired   Tobacco Use  . Smoking status: Never Smoker  . Smokeless tobacco: Never Used    Vaping Use  . Vaping Use: Never used  Substance and Sexual Activity  . Alcohol use: No  . Drug use: No  . Sexual activity: Not on file  Other Topics Concern  . Not on file  Social History Narrative  . Not on file   Social Determinants of Health   Financial Resource Strain: Low Risk   . Difficulty of Paying Living Expenses: Not hard at all  Food Insecurity: No Food Insecurity  . Worried About Charity fundraiser in the Last Year: Never true  . Ran Out of Food in the Last Year: Never true  Transportation Needs: No Transportation Needs  . Lack of Transportation (Medical): No  . Lack of Transportation (Non-Medical): No  Physical Activity: Insufficiently Active  . Days of Exercise per Week: 3 days  . Minutes of Exercise per Session: 30 min  Stress: No Stress Concern Present  . Feeling of Stress : Not at all  Social Connections:   . Frequency of Communication with Friends and Family: Not on file  . Frequency of Social Gatherings with Friends and Family: Not on file  . Attends Religious Services: Not on file  . Active Member of Clubs or Organizations: Not on file  . Attends Archivist Meetings: Not on file  . Marital Status: Not on file    Tobacco Counseling Counseling given: Not Answered   Clinical Intake:  Pre-visit preparation completed: Yes  Pain : No/denies pain     Nutritional Status: BMI 25 -29 Overweight Nutritional Risks: None Diabetes: No  How often do you need to have someone help you when you read instructions, pamphlets, or other written materials from your doctor or pharmacy?: 1 - Never What is the last grade level you completed in school?: 14 yrs  Diabetic? no  Interpreter Needed?: No  Information entered by :: NAllen LPN   Activities of Daily Living In your present state of health, do you have any difficulty performing the following activities: 11/04/2019  Hearing? N  Vision? N  Difficulty concentrating or making decisions? N   Walking or climbing stairs? N  Dressing or bathing? N  Doing errands, shopping? N  Preparing Food and eating ? N  Using the Toilet? N  In the past six months, have you accidently leaked urine? Y  Comment on ocassion  Do you have problems with loss of bowel control? N  Managing your Medications? N  Managing your Finances? N  Housekeeping or managing your Housekeeping? N  Some recent data might be hidden    Patient Care Team: Malfi, Lupita Raider, FNP as PCP - General (Family Medicine)  Indicate any recent Medical Services you may have received from other than [facility] providers in the past year (date may be approximate).     Assessment:   This is a routine wellness examination for Lennon.  Hearing/Vision screen  Hearing Screening   125Hz  250Hz  500Hz  1000Hz  2000Hz  3000Hz  4000Hz  6000Hz  8000Hz   Right ear:  Left ear:           Vision Screening Comments: Regular eye exams, [facility]  Dietary issues and exercise activities discussed: Current Exercise Habits: Home exercise routine, Type of exercise: walking, Time (Minutes): 30, Frequency (Times/Week): 3, Weekly Exercise (Minutes/Week): 90  Goals    . DIET - INCREASE WATER INTAKE      recommend drinking at least 6-8 glasses of water a day     . Patient Stated     11/04/2019 wants to weigh 160 pounds      Depression Screen PHQ 2/9 Scores 11/04/2019 06/17/2019 10/08/2018 08/16/2018 01/11/2018 09/18/2017 05/14/2017  PHQ - 2 Score 0 0 1 4 0 2 0  PHQ- 9 Score 4 7 4 16 7 11 8     Fall Risk Fall Risk  11/04/2019 10/08/2018 08/16/2018 09/18/2017 10/18/2016  Falls in the past year? 0 0 0 No No  Number falls in past yr: - 0 0 - -  Injury with Fall? - 0 - - -  Comment - - - - -  Risk for fall due to : Medication side effect - - - -  Follow up Falls evaluation completed;Education provided;Falls prevention discussed - - - -    Any stairs in or around the home? Yes  If so, are there any without handrails? No  Home free of loose  throw rugs in walkways, pet beds, electrical cords, etc? Yes  Adequate lighting in your home to reduce risk of falls? Yes   ASSISTIVE DEVICES UTILIZED TO PREVENT FALLS:  Life alert? No  Use of a cane, walker or w/c? No  Grab bars in the bathroom? No  Shower chair or bench in shower? No  Elevated toilet seat or a handicapped toilet? Yes   TIMED UP AND GO:  Was the test performed? No . .    Cognitive Function:     6CIT Screen 11/04/2019 09/18/2017 08/22/2016  What Year? 0 points 0 points 0 points  What month? 0 points 0 points 0 points  What time? 0 points 0 points 0 points  Count back from 20 0 points 0 points 0 points  Months in reverse 0 points 0 points 0 points  Repeat phrase 0 points 0 points 0 points  Total Score 0 0 0    Immunizations Immunization History  Administered Date(s) Administered  . Influenza, High Dose Seasonal PF 10/29/2017, 10/01/2018  . Influenza-Unspecified 11/06/2015, 10/28/2016, 10/29/2017, 10/14/2019  . PFIZER SARS-COV-2 Vaccination 02/19/2019, 03/12/2019, 09/30/2019  . Pneumococcal Conjugate-13 11/09/2014  . Zoster 01/03/2008    TDAP status: Up to date Flu Vaccine status: Up to date Pneumococcal vaccine status: Up to date Covid-19 vaccine status: Completed vaccines  Qualifies for Shingles Vaccine? Yes   Zostavax completed Yes   Shingrix Completed?: Yes  Screening Tests Health Maintenance  Topic Date Due  . MAMMOGRAM  02/16/2020  . TETANUS/TDAP  01/02/2021  . COLONOSCOPY  11/28/2025  . INFLUENZA VACCINE  Completed  . DEXA SCAN  Completed  . COVID-19 Vaccine  Completed  . Hepatitis C Screening  Completed  . PNA vac Low Risk Adult  Completed    Health Maintenance  There are no preventive care reminders to display for this patient.  Colorectal cancer screening: Completed 11/29/2015. Repeat every 10 years Mammogram status: decline Bone Density status: Completed 04/17/2016.  Lung Cancer Screening: (Low Dose CT Chest recommended if Age  73-80 years, 30 pack-year currently smoking OR have quit w/in 15years.) does not qualify.   Lung Cancer  Screening Referral: no  Additional Screening:  Hepatitis C Screening: does qualify; Completed 09/27/2015  Vision Screening: Recommended annual ophthalmology exams for early detection of glaucoma and other disorders of the eye. Is the patient up to date with their annual eye exam?  Yes  Who is the provider or what is the name of the office in which the patient attends annual eye exams? [facility] If pt is not established with a provider, would they like to be referred to a provider to establish care? No .   Dental Screening: Recommended annual dental exams for proper oral hygiene  Community Resource Referral / Chronic Care Management: CRR required this visit?  No   CCM required this visit?  No      Plan:     I have personally reviewed and noted the following in the patient's chart:   . Medical and social history . Use of alcohol, tobacco or illicit drugs  . Current medications and supplements . Functional ability and status . Nutritional status . Physical activity . Advanced directives . List of other physicians . Hospitalizations, surgeries, and ER visits in previous 12 months . Vitals . Screenings to include cognitive, depression, and falls . Referrals and appointments  In addition, I have reviewed and discussed with patient certain preventive protocols, quality metrics, and best practice recommendations. A written personalized care plan for preventive services as well as general preventive health recommendations were provided to patient.     Kellie Simmering, LPN   [id number]   Nurse Notes:

## 2019-11-05 ENCOUNTER — Other Ambulatory Visit: Payer: Self-pay | Admitting: Family Medicine

## 2019-11-05 DIAGNOSIS — F5101 Primary insomnia: Secondary | ICD-10-CM

## 2019-11-05 NOTE — Telephone Encounter (Signed)
Requested medications are due for refill today?  Yes - This medication refill cannot be delegated.    Requested medications are on active medication list?  Yes  Last Refill:  10/13/2019  # 30 with no refills   Future visit scheduled?  No   Notes to Clinic:  This medication refill cannot be delegated.

## 2019-11-07 ENCOUNTER — Telehealth: Payer: Self-pay

## 2019-11-18 NOTE — Telephone Encounter (Signed)
Open in error

## 2019-12-02 ENCOUNTER — Other Ambulatory Visit: Payer: Self-pay

## 2019-12-02 ENCOUNTER — Ambulatory Visit (INDEPENDENT_AMBULATORY_CARE_PROVIDER_SITE_OTHER): Payer: Medicare PPO | Admitting: Family Medicine

## 2019-12-02 ENCOUNTER — Encounter: Payer: Self-pay | Admitting: Family Medicine

## 2019-12-02 VITALS — BP 129/73 | HR 78 | Temp 98.4°F | Ht 64.0 in | Wt 181.4 lb

## 2019-12-02 DIAGNOSIS — E66811 Obesity, class 1: Secondary | ICD-10-CM | POA: Insufficient documentation

## 2019-12-02 DIAGNOSIS — F419 Anxiety disorder, unspecified: Secondary | ICD-10-CM | POA: Diagnosis not present

## 2019-12-02 DIAGNOSIS — Z6831 Body mass index (BMI) 31.0-31.9, adult: Secondary | ICD-10-CM | POA: Diagnosis not present

## 2019-12-02 DIAGNOSIS — F5104 Psychophysiologic insomnia: Secondary | ICD-10-CM | POA: Diagnosis not present

## 2019-12-02 DIAGNOSIS — E6609 Other obesity due to excess calories: Secondary | ICD-10-CM | POA: Diagnosis not present

## 2019-12-02 MED ORDER — CLONAZEPAM 1 MG PO TABS: 1.0000 mg | ORAL_TABLET | Freq: Two times a day (BID) | ORAL | 2 refills | Status: DC | PRN

## 2019-12-02 MED ORDER — ESZOPICLONE 1 MG PO TABS: 1.0000 mg | ORAL_TABLET | Freq: Every evening | ORAL | 0 refills | Status: DC | PRN

## 2019-12-02 MED ORDER — CONTRAVE 8-90 MG PO TB12: ORAL_TABLET | ORAL | 1 refills | Status: DC

## 2019-12-02 NOTE — Progress Notes (Signed)
Subjective:    Patient ID: Kathy Morton, female    DOB: 02-09-1945, 74 y.o.   MRN: [id number]  Kathy Morton is a 74 y.o. female presenting on 12/02/2019 for Anxiety (pt complains of difficulty staying asleep. She want to discuss changing the [facility]. She state the Seroquel is not as effect with her insomnia as it was in the past.) and Insomnia   HPI   [name] presents to clinic for concerns of anxiety, insomnia and weight gain.  Reports she has been having increased difficulty with falling asleep, staying asleep, and that her anxiety has increased with COVID.  States that she has tried seroquel, trazodone and ambien without improvement in her sleep.  Is interested in another medication.    Is interested in restarting on contrave, that she had taken this years ago with good results of weight loss.  States has been having difficulty with working on weight loss since she has been home bound with COVID.  Depression screen [facility] 2/9 12/02/2019 11/04/2019 06/17/2019  Decreased Interest 1 0 0  Down, Depressed, Hopeless 1 0 0  PHQ - 2 Score 2 0 0  Altered sleeping 2 1 3   Tired, decreased energy 1 0 0  Change in appetite 3 3 3   Feeling bad or failure about yourself  1 0 1  Trouble concentrating 1 0 0  Moving slowly or fidgety/restless 0 0 0  Suicidal thoughts 0 0 0  PHQ-9 Score 10 4 7   Difficult doing work/chores Not difficult at all Not difficult at all Not difficult at all  Some recent data might be hidden    Social History   Tobacco Use  . Smoking status: Never Smoker  . Smokeless tobacco: Never Used  Vaping Use  . Vaping Use: Never used  Substance Use Topics  . Alcohol use: No  . Drug use: No    Review of Systems  Constitutional: Negative.   HENT: Negative.   Eyes: Negative.   Respiratory: Negative.   Cardiovascular: Negative.   Gastrointestinal: Negative.   Endocrine: Negative.   Genitourinary: Negative.   Musculoskeletal: Negative.   Skin: Negative.     Allergic/Immunologic: Negative.   Neurological: Negative.   Hematological: Negative.   Psychiatric/Behavioral: Positive for sleep disturbance. Negative for agitation, behavioral problems, confusion, decreased concentration, dysphoric mood, hallucinations, self-injury and suicidal ideas. The patient is nervous/anxious. The patient is not hyperactive.    Per HPI unless specifically indicated above     Objective:    BP 129/73 (BP Location: Left Arm, Patient Position: Sitting, Cuff Size: Normal)   Pulse 78   Temp 98.4 F (36.9 C) (Oral)   Ht 5\' 4"  (1.626 m)   Wt 181 lb 6.4 oz (82.3 kg)   SpO2 98%   BMI 31.14 kg/m   Wt Readings from Last 3 Encounters:  12/02/19 181 lb 6.4 oz (82.3 kg)  11/04/19 170 lb (77.1 kg)  06/17/19 177 lb 6.4 oz (80.5 kg)    Physical Exam Vitals and nursing note reviewed.  Constitutional:      General: She is not in acute distress.    Appearance: Normal appearance. She is well-developed and well-groomed. She is obese. She is not ill-appearing or toxic-appearing.  HENT:     Head: Normocephalic and atraumatic.     Nose:     Comments: Kathy Morton is in place, covering mouth and nose. Eyes:     General: Lids are normal. Vision grossly intact.        Right  eye: No discharge.        Left eye: No discharge.     Extraocular Movements: Extraocular movements intact.     Conjunctiva/sclera: Conjunctivae normal.     Pupils: Pupils are equal, round, and reactive to light.  Cardiovascular:     Rate and Rhythm: Normal rate and regular rhythm.     Pulses: Normal pulses.     Heart sounds: Normal heart sounds. No murmur heard.  No friction rub. No gallop.   Pulmonary:     Effort: Pulmonary effort is normal. No respiratory distress.     Breath sounds: Normal breath sounds.  Skin:    General: Skin is warm and dry.     Capillary Refill: Capillary refill takes less than 2 seconds.  Neurological:     General: No focal deficit present.     Mental Status: She is alert and  oriented to person, place, and time.  Psychiatric:        Attention and Perception: Attention and perception normal.        Mood and Affect: Mood and affect normal.        Speech: Speech normal.        Behavior: Behavior normal. Behavior is cooperative.        Thought Content: Thought content normal.        Cognition and Memory: Cognition and memory normal.        Judgment: Judgment normal.    Results for orders placed or performed in visit on 10/18/16  Lipid panel  Result Value Ref Range   Cholesterol 130 <200 mg/dL   HDL 52 >50 mg/dL   Triglycerides 93 <150 mg/dL   LDL Cholesterol (Calc) 60 mg/dL (calc)   Total CHOL/HDL Ratio 2.5 <5.0 (calc)   Non-HDL Cholesterol (Calc) 78 <130 mg/dL (calc)  Comprehensive metabolic panel  Result Value Ref Range   Glucose, Bld 84 65 - 99 mg/dL   BUN 18 7 - 25 mg/dL   Creat 0.97 (H) 0.60 - 0.93 mg/dL   BUN/Creatinine Ratio 19 6 - 22 (calc)   Sodium 140 135 - 146 mmol/L   Potassium 4.0 3.5 - 5.3 mmol/L   Chloride 105 98 - 110 mmol/L   CO2 26 20 - 32 mmol/L   Calcium 10.8 (H) 8.6 - 10.4 mg/dL   Total Protein 6.7 6.1 - 8.1 g/dL   Albumin 4.3 3.6 - 5.1 g/dL   Globulin 2.4 1.9 - 3.7 g/dL (calc)   AG Ratio 1.8 1.0 - 2.5 (calc)   Total Bilirubin 0.4 0.2 - 1.2 mg/dL   Alkaline phosphatase (APISO) 89 33 - 130 U/L   AST 15 10 - 35 U/L   ALT 11 6 - 29 U/L  Hemoglobin A1c  Result Value Ref Range   Hgb A1c MFr Bld 5.4 <5.7 % of total Hgb   Mean Plasma Glucose 108 (calc)   eAG (mmol/L) 6.0 (calc)      Assessment & Plan:   Problem List Items Addressed This Visit      Other   Insomnia - Primary    Still having continued difficulty with sleep.  Has tried and failed seroquel, trazodone, and ambien in the past.  Discussed lunesta, belsomra, and dayvigo.  Patient interested in trying lunesta.  Will send Rx to pharmacy on file.  Sleep hygiene handout provided.      Relevant Medications   eszopiclone (LUNESTA) 1 MG TABS tablet   Anxiety    Has  increased anxiety regarding her clonazepam  prescription.  Reports that she had previously been on clonazepam 1mg  BID and was reduced to 0.5mg  BID with 45 tablets per month, was finding that she was having increased anxiety about having enough medication to last for the entire month.  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        Relevant Medications   clonazePAM (KLONOPIN) 1 MG tablet   Class 1 obesity due to excess calories without serious comorbidity with body mass index (BMI) of 31.0 to 31.9 in adult    Will restart on Contrave as directed.  Encouraged daily exercise, and increase in water intake and decrease in calorie intake.  Contrave Rx sent to [facility] for fill.      Relevant Medications   Naltrexone-buPROPion HCl ER (CONTRAVE) 8-90 MG TB12      Meds ordered this encounter  Medications  . eszopiclone (LUNESTA) 1 MG TABS tablet    Sig: Take 1 tablet (1 mg total) by mouth at bedtime as needed for sleep. Take immediately before bedtime    Dispense:  30 tablet    Refill:  0  . clonazePAM (KLONOPIN) 1 MG tablet    Sig: Take 1 tablet (1 mg total) by mouth 2 (two) times daily as needed for anxiety.    Dispense:  60 tablet    Refill:  2  . Naltrexone-buPROPion HCl ER (CONTRAVE) 8-90 MG TB12    Sig: Start 1 tablet every morning for 7 days, then 1 tablet twice daily for 7 days, then 2 tablets every morning and one every evening    Dispense:  120 tablet    Refill:  1    Contact patient on home phone# [phone]    Follow up plan: Return in about 4 weeks (around 12/30/2019) for Insomnia f/u.   Harlin Rain, [place] [facility] Nurse Practitioner [facility] 12/02/2019, 3:34 PM

## 2019-12-02 NOTE — Patient Instructions (Signed)
I have sent in a prescription for contrave, to take as directed.  Be sure to increase your water intake and decrease your calorie intake while taking this medication to help with weight loss.  I have sent in a refill on your clonazepam as we discussed.  The following recommendations are helpful adjuncts for helping rebalance your mood.  Eat a nourishing diet. Ensure adequate intake of calories, protein, carbs, fat, vitamins, and minerals. Prioritize whole foods at each meal, including meats, vegetables, fruits, nuts and seeds, etc.   Avoid inflammatory and/or "junk" foods, such as sugar, omega-6 fats, refined grains, chemicals, and preservatives are common in packaged and prepared foods. Minimize or completely avoid these ingredients and stick to whole foods with little to no additives. Cook from scratch as much as possible for more control over what you eat  Get enough sleep. Poor sleep is significantly associated with depression and anxiety. Make 7-9 hours of sleep nightly a top priority  Exercise appropriately. Exercise is known to improve brain functioning and boost mood. Aim for 30 minutes of daily physical activity. Avoid "overtraining," which can cause mental disturbances  Assess your light exposure. Not enough natural light during the day and too much artificial light can have a major impact on your mood. Get outside as often as possible during daylight hours. Minimize light exposure after dark and avoid the use of electronics that give off blue light before bed  Manage your stress.  Use daily stress management techniques such as meditation, yoga, or mindfulness to retrain your brain to respond differently to stress. Try deep breathing to deactivate your "fight or flight" response.  There are many of sources with apps like Headspace, Calm or a variety of YouTube videos (videos from Gwynne Edinger have guided meditation)  Prioritize your social life. Work on building social support with  new friends or improve current relationships. Consider getting a pet that allows for companionship, social interaction, and physical touch. Try volunteering or joining a faith-based community to increase your sense of purpose  4-7-8 breathing technique at bedtime: breathe in to count of 4, hold breath for count of 7, exhale for count of 8; do 3-5 times for letting go of overactive thoughts  Take time to play Unstructured "play" time can help reduce anxiety and depression Options for play include music, games, sports, dance, art, etc.  Try to add daily omega 3 fatty acids, magnesium, B complex, and balanced amino acid supplements to help improve mood and anxiety.  I have sent in a prescription for Lunesta 37m to take 1 tablet before bed.  If the pharmacy has difficulty getting this approved or needs a prior authorization, please let [email or web address] so we can begin working on this.  Sleep hygiene is the single most effective treatment for sleep issues, but it is hard work.  Tips for a good night's sleep:  -Keep sleep environment comfortable and conducive to sleep -Keep regular sleep schedule 7 nights a week -Avoiding naps during the day -Avoiding going to bed until drowsy and ready to sleep, not trying to sleep, and not watching the clock -Get out of bed if not asleep within 15-20 minutes and returning only when drowsy -Avoiding caffeine, nicotine, alcohol, and other substances that interfere with sleep before bedtime -Take an hour before your set bedtime and start to wind down: bath/shower, no more TV or phone (the blue light can interfere with sleeping), listen to soothing music, or meditation -No TV in your bedroom -Exercising regularly, at  least 6 hours before sleep. Yoga and Tai Chi can improve sleep quality  There are a lot of books and apps that may help guide you with any of the following:   -Progressive muscle relaxation (involves methodical tension and relaxation of different Muscle groups  throughout body)  Guided imagery  -YouTube - Gwynne Edinger has free videos on YouTube that can help with meditation and some   Abdominal breathing   Over the counter sleep aid one hour before bed- and gradually wean your use over 2-4 weeks  Some examples are : *Melatonin 5-10 mg *Sleepology (Can find on [employer]) taken according to packaging directions  There are a few online evidence based online programs, unfortunately they are not free.   Developed by a sleep expert who created a drug-free program for insomnia proven more effective than sleeping pills.  www.cbtforinsomnia.com Sleepio is an evidence-based digital sleep improvement program   www.sleepio.com SHUTi is designed to actively help retrain your body and mind for great sleep through six engaging Cognitive Behavioral Therapy for Insomnia strategy and learning sessions  [email or web address]  We will plan to see you back in 4 weeks for insomnia follow up visit  You will receive a survey after today's visit either digitally by e-mail or paper by [place] mail. Your experiences and feedback matter to [place].  Please respond so we know how we are doing as we provide care for you.  Call us with any questions/concerns/needs.  It is my goal to be available to you for your health concerns.  Thanks for choosing me to be a partner in your healthcare needs!  Harlin Rain, FNP-C Family Nurse Practitioner [facility] Phone: [phone]

## 2019-12-03 ENCOUNTER — Encounter: Payer: Self-pay | Admitting: Family Medicine

## 2019-12-03 NOTE — Assessment & Plan Note (Signed)
Will restart on Contrave as directed.  Encouraged daily exercise, and increase in water intake and decrease in calorie intake.  Contrave Rx sent to [facility] for fill.

## 2019-12-03 NOTE — Assessment & Plan Note (Signed)
Still having continued difficulty with sleep.  Has tried and failed seroquel, trazodone, and ambien in the past.  Discussed lunesta, belsomra, and dayvigo.  Patient interested in trying lunesta.  Will send Rx to pharmacy on file.  Sleep hygiene handout provided.

## 2019-12-03 NOTE — Assessment & Plan Note (Signed)
Has increased anxiety regarding her clonazepam prescription.  Reports that she had previously been on clonazepam 1mg  BID and was reduced to 0.5mg  BID with 45 tablets per month, was finding that she was having increased anxiety about having enough medication to last for the entire month.  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

## 2020-01-07 ENCOUNTER — Ambulatory Visit: Payer: Medicare PPO | Admitting: Family Medicine

## 2020-01-14 ENCOUNTER — Encounter: Payer: Self-pay | Admitting: Family Medicine

## 2020-01-14 ENCOUNTER — Other Ambulatory Visit: Payer: Self-pay

## 2020-01-14 ENCOUNTER — Ambulatory Visit (INDEPENDENT_AMBULATORY_CARE_PROVIDER_SITE_OTHER): Payer: Medicare PPO | Admitting: Family Medicine

## 2020-01-14 VITALS — BP 135/70 | HR 82 | Temp 97.3°F | Resp 18 | Ht 64.0 in | Wt 179.4 lb

## 2020-01-14 DIAGNOSIS — E6609 Other obesity due to excess calories: Secondary | ICD-10-CM

## 2020-01-14 DIAGNOSIS — Z6833 Body mass index (BMI) 33.0-33.9, adult: Secondary | ICD-10-CM

## 2020-01-14 DIAGNOSIS — F5104 Psychophysiologic insomnia: Secondary | ICD-10-CM | POA: Diagnosis not present

## 2020-01-14 NOTE — Progress Notes (Signed)
Subjective:    Patient ID: Kathy Morton, female    DOB: [birth year], 75 y.o.   MRN: [id number]  Kathy Morton is a 75 y.o. female presenting on 01/14/2020 for Insomnia and Obesity (Pt state she discontinued the Contrave because it was causing urinary issues. )   HPI  Ms. Zobrist presents to clinic for follow up on her insomnia and obesity.  She reports that she had stopped taking the contrave due to concerns for muscle spasms/twinges near her kidneys with history of chronic UTIs when she was younger.  Denies any current dysuria, urinary frequency, urgency, hesitancy, feeling of incomplete bladder emptying, hematuria, or other concerns today.  Reports she has not started the lunesta, that she has seen an improvement in her sleep, will hold onto the prescription she has for lunesta and will plan to start if she begins to have difficulty with sleep again in the future.  Depression screen [facility] 2/9 12/02/2019 11/04/2019 06/17/2019  Decreased Interest 1 0 0  Down, Depressed, Hopeless 1 0 0  PHQ - 2 Score 2 0 0  Altered sleeping 2 1 3   Tired, decreased energy 1 0 0  Change in appetite 3 3 3   Feeling bad or failure about yourself  1 0 1  Trouble concentrating 1 0 0  Moving slowly or fidgety/restless 0 0 0  Suicidal thoughts 0 0 0  PHQ-9 Score 10 4 7   Difficult doing work/chores Not difficult at all Not difficult at all Not difficult at all  Some recent data might be hidden    Social History   Tobacco Use  . Smoking status: Never Smoker  . Smokeless tobacco: Never Used  Vaping Use  . Vaping Use: Never used  Substance Use Topics  . Alcohol use: No  . Drug use: No    Review of Systems  Constitutional: Negative.   HENT: Negative.   Eyes: Negative.   Respiratory: Negative.   Cardiovascular: Negative.   Gastrointestinal: Negative.   Endocrine: Negative.   Genitourinary: Negative.   Musculoskeletal: Negative.   Skin: Negative.   Allergic/Immunologic: Negative.    Neurological: Negative.   Hematological: Negative.   Psychiatric/Behavioral: Positive for sleep disturbance. Negative for agitation, behavioral problems, confusion, decreased concentration, dysphoric mood, hallucinations, self-injury and suicidal ideas. The patient is nervous/anxious. The patient is not hyperactive.    Per HPI unless specifically indicated above     Objective:    BP 135/70 (BP Location: Right Arm, Patient Position: Sitting, Cuff Size: Normal)   Pulse 82   Temp (!) 97.3 F (36.3 C) (Temporal)   Resp 18   Ht 5\' 4"  (1.626 m)   Wt 179 lb 6.4 oz (81.4 kg)   SpO2 100%   BMI 30.79 kg/m   Wt Readings from Last 3 Encounters:  01/14/20 179 lb 6.4 oz (81.4 kg)  12/02/19 181 lb 6.4 oz (82.3 kg)  11/04/19 170 lb (77.1 kg)    Physical Exam Vitals and nursing note reviewed.  Constitutional:      General: She is not in acute distress.    Appearance: Normal appearance. She is well-developed and well-groomed. She is obese. She is not ill-appearing or toxic-appearing.  HENT:     Head: Normocephalic and atraumatic.     Nose:     Comments: Lizbeth Bark is in place, covering mouth and nose. Eyes:     General: Lids are normal. Vision grossly intact.        Right eye: No discharge.  Left eye: No discharge.     Extraocular Movements: Extraocular movements intact.     Conjunctiva/sclera: Conjunctivae normal.     Pupils: Pupils are equal, round, and reactive to light.  Cardiovascular:     Rate and Rhythm: Normal rate and regular rhythm.     Pulses: Normal pulses.     Heart sounds: Normal heart sounds. No murmur heard. No friction rub. No gallop.   Pulmonary:     Effort: Pulmonary effort is normal. No respiratory distress.     Breath sounds: Normal breath sounds.  Skin:    General: Skin is warm and dry.     Capillary Refill: Capillary refill takes less than 2 seconds.  Neurological:     General: No focal deficit present.     Mental Status: She is alert and oriented to  person, place, and time.  Psychiatric:        Attention and Perception: Attention and perception normal.        Mood and Affect: Mood and affect normal.        Speech: Speech normal.        Behavior: Behavior normal. Behavior is cooperative.        Thought Content: Thought content normal.        Cognition and Memory: Cognition and memory normal.        Judgment: Judgment normal.    Results for orders placed or performed in visit on 10/18/16  Lipid panel  Result Value Ref Range   Cholesterol 130 <200 mg/dL   HDL 52 >50 mg/dL   Triglycerides 93 <150 mg/dL   LDL Cholesterol (Calc) 60 mg/dL (calc)   Total CHOL/HDL Ratio 2.5 <5.0 (calc)   Non-HDL Cholesterol (Calc) 78 <130 mg/dL (calc)  Comprehensive metabolic panel  Result Value Ref Range   Glucose, Bld 84 65 - 99 mg/dL   BUN 18 7 - 25 mg/dL   Creat 0.97 (H) 0.60 - 0.93 mg/dL   BUN/Creatinine Ratio 19 6 - 22 (calc)   Sodium 140 135 - 146 mmol/L   Potassium 4.0 3.5 - 5.3 mmol/L   Chloride 105 98 - 110 mmol/L   CO2 26 20 - 32 mmol/L   Calcium 10.8 (H) 8.6 - 10.4 mg/dL   Total Protein 6.7 6.1 - 8.1 g/dL   Albumin 4.3 3.6 - 5.1 g/dL   Globulin 2.4 1.9 - 3.7 g/dL (calc)   AG Ratio 1.8 1.0 - 2.5 (calc)   Total Bilirubin 0.4 0.2 - 1.2 mg/dL   Alkaline phosphatase (APISO) 89 33 - 130 U/L   AST 15 10 - 35 U/L   ALT 11 6 - 29 U/L  Hemoglobin A1c  Result Value Ref Range   Hgb A1c MFr Bld 5.4 <5.7 % of total Hgb   Mean Plasma Glucose 108 (calc)   eAG (mmol/L) 6.0 (calc)      Assessment & Plan:   Problem List Items Addressed This Visit      Other   Class 1 obesity due to excess calories with body mass index (BMI) of 33.0 to 33.9 in adult    Has stopped contrave due to muscle twinges.  Will work towards lifestyle/dietary modifications.      Insomnia - Primary    Reports improvement with sleep, has not started the lunesta.  Will continue to work on sleep hygiene routine and reports will start on lunesta if symptoms of insomnia  return.         No orders of the defined  types were placed in this encounter.  Follow up plan: Return in about 6 months (around 07/13/2020) for Insomnia & Anxiety f/u.   Harlin Rain, [place] Family Nurse Practitioner [facility] 01/14/2020, 11:25 AM

## 2020-01-14 NOTE — Assessment & Plan Note (Signed)
Reports improvement with sleep, has not started the lunesta.  Will continue to work on sleep hygiene routine and reports will start on lunesta if symptoms of insomnia return.

## 2020-01-14 NOTE — Assessment & Plan Note (Signed)
Has stopped contrave due to muscle twinges.  Will work towards lifestyle/dietary modifications.

## 2020-01-14 NOTE — Patient Instructions (Signed)
Continue all medications as prescribed  Sleep hygiene is the single most effective treatment for sleep issues, but it is hard work.  Tips for a good night's sleep:  -Keep sleep environment comfortable and conducive to sleep -Keep regular sleep schedule 7 nights a week -Avoiding naps during the day -Avoiding going to bed until drowsy and ready to sleep, not trying to sleep, and not watching the clock -Get out of bed if not asleep within 15-20 minutes and returning only when drowsy -Avoiding caffeine, nicotine, alcohol, and other substances that interfere with sleep before bedtime -Take an hour before your set bedtime and start to wind down: bath/shower, no more TV or phone (the blue light can interfere with sleeping), listen to soothing music, or meditation -No TV in your bedroom -Exercising regularly, at least 6 hours before sleep. Yoga and Tai Chi can improve sleep quality  There are a lot of books and apps that may help guide you with any of the following:   -Progressive muscle relaxation (involves methodical tension and relaxation of different Muscle groups throughout body)  Guided imagery  -YouTube - Gwynne Edinger has free videos on YouTube that can help with meditation and some   Abdominal breathing   Over the counter sleep aid one hour before bed- and gradually wean your use over 2-4 weeks  Some examples are : *Melatonin 5-10 mg *Sleepology (Can find on [employer]) taken according to packaging directions  There are a few online evidence based online programs, unfortunately they are not free.   Developed by a sleep expert who created a drug-free program for insomnia proven more effective than sleeping pills.  www.cbtforinsomnia.com Sleepio is an evidence-based digital sleep improvement program   www.sleepio.com SHUTi is designed to actively help retrain your body and mind for great sleep through six engaging Cognitive Behavioral Therapy for Insomnia strategy and learning  sessions  [email or web address]  We will plan to see you back in 6 months for anxiety and insomnia follow up visit  You will receive a survey after today's visit either digitally by e-mail or paper by [place] mail. Your experiences and feedback matter to [place].  Please respond so we know how we are doing as we provide care for you.  Call us with any questions/concerns/needs.  It is my goal to be available to you for your health concerns.  Thanks for choosing me to be a partner in your healthcare needs!  Harlin Rain, FNP-C Family Nurse Practitioner [facility] Phone: [phone]

## 2020-02-10 ENCOUNTER — Telehealth: Payer: Self-pay | Admitting: Family Medicine

## 2020-02-10 DIAGNOSIS — F5101 Primary insomnia: Secondary | ICD-10-CM

## 2020-02-10 MED ORDER — QUETIAPINE FUMARATE 200 MG PO TABS: 200.0000 mg | ORAL_TABLET | Freq: Every day | ORAL | 1 refills | Status: DC

## 2020-02-10 NOTE — Addendum Note (Signed)
Addended by: Kathrine Haddock on: 02/10/2020 04:43 PM   Modules accepted: Orders

## 2020-02-10 NOTE — Telephone Encounter (Signed)
Patient called and said she did get the prescription called in to the pharmacy but it was the wrong strength. She got 200 mg, they are too strong. She is requesting 100 mg. She can be reached at [phone]. Please advise

## 2020-02-10 NOTE — Telephone Encounter (Signed)
The pt called requesting to go back on her Seroquel. She state that the Johnnye Sima is not working at all and she would prefer going back on the Seroquel. Please advise

## 2020-02-10 NOTE — Telephone Encounter (Signed)
Copied from [place] [id number]. Topic: Quick Communication - Rx Refill/Question >> Feb 10, 2020 12:52 PM Mcneil, Ja-Kwan wrote: Medication: QUEtiapine (SEROQUEL) 100 MG tablet   Has the patient contacted their pharmacy? yes   Preferred Pharmacy (with phone number or street name): [id number] [place] [address]  Phone: [phone]   Fax: [phone]  Agent: Please be advised that RX refills may take up to 3 business days. We ask that you follow-up with your pharmacy.

## 2020-02-10 NOTE — Telephone Encounter (Signed)
Requested medication (s) are due for refill today: Seroquel, yes  Requested medication (s) are on the active medication list: no, medication d/c'd 12/02/19    Last refill:?  Future visit scheduled: yes Medicare Wellness  Notes to clinic: not delegated;

## 2020-02-11 MED ORDER — QUETIAPINE FUMARATE 100 MG PO TABS: 100.0000 mg | ORAL_TABLET | Freq: Every day | ORAL | 1 refills | Status: DC

## 2020-02-11 NOTE — Telephone Encounter (Signed)
Pt called back to report that she is still waiting for her Rx, she states that she will be completely out of her current supply in just a few days

## 2020-02-11 NOTE — Telephone Encounter (Signed)
The pt was notified that the dosage was changed. She verbalize understanding

## 2020-02-11 NOTE — Telephone Encounter (Signed)
[facility] , take one nightly, #90 +1 refill  Please notify patient I have corrected the dosage now and sent new rx to pharmacy  Nobie Putnam, [facility] 02/11/2020, 1:26 PM

## 2020-02-11 NOTE — Telephone Encounter (Signed)
Pt requesting a prescription for Seroquel 100MG  with the directions to take once daily. Kathrine Haddock, NP sent the script over on yesterday for Seroquel 200MG . Please advise

## 2020-02-11 NOTE — Addendum Note (Signed)
Addended by: Olin Hauser on: 02/11/2020 01:26 PM   Modules accepted: Orders

## 2020-02-13 ENCOUNTER — Other Ambulatory Visit: Payer: Self-pay

## 2020-02-13 DIAGNOSIS — F419 Anxiety disorder, unspecified: Secondary | ICD-10-CM

## 2020-02-13 MED ORDER — CLONAZEPAM 1 MG PO TABS: 1.0000 mg | ORAL_TABLET | Freq: Two times a day (BID) | ORAL | 2 refills | Status: DC | PRN

## 2020-02-23 ENCOUNTER — Other Ambulatory Visit: Payer: Self-pay | Admitting: Family Medicine

## 2020-02-23 DIAGNOSIS — I10 Essential (primary) hypertension: Secondary | ICD-10-CM

## 2020-02-23 DIAGNOSIS — E785 Hyperlipidemia, unspecified: Secondary | ICD-10-CM

## 2020-02-23 NOTE — Telephone Encounter (Signed)
Requested medication (s) are due for refill today: Yes  Requested medication (s) are on the active medication list: Yes  Last refill:  05/25/19  Future visit scheduled: No  Notes to clinic:  Unable to refill per protocol, last refill by another provider, see notes below.     Requested Prescriptions  Pending Prescriptions Disp Refills   atorvastatin (LIPITOR) 20 MG tablet [Pharmacy Med Name: ATORVASTATIN 20 MG TABLET] 30 tablet 0    Sig: TAKE 1 TABLET BY MOUTH EVERY DAY      Cardiovascular:  Antilipid - Statins Failed - 02/23/2020  9:41 AM      Failed - Total Cholesterol in normal range and within 360 days    Cholesterol, Total  Date Value Ref Range Status  11/11/2014 242 (H) 100 - 199 mg/dL Final   Cholesterol  Date Value Ref Range Status  10/20/2016 130 <200 mg/dL Final          Failed - LDL in normal range and within 360 days    LDL Cholesterol (Calc)  Date Value Ref Range Status  10/20/2016 60 mg/dL (calc) Final    Comment:    Reference range: <100 . Desirable range <100 mg/dL for primary prevention;   <70 mg/dL for patients with CHD or diabetic patients  with > or = 2 CHD risk factors. Marland Kitchen LDL-C is now calculated using the Martin-Hopkins  calculation, which is a validated novel method providing  better accuracy than the Friedewald equation in the  estimation of LDL-C.  Cresenciano Genre et al. Annamaria Helling. 9242;683(41): 2061-2068  (http://education.[employer].com/faq/FAQ164)           Failed - HDL in normal range and within 360 days    HDL  Date Value Ref Range Status  10/20/2016 52 >50 mg/dL Final  11/11/2014 54 >39 mg/dL Final    Comment:    According to ATP-III Guidelines, HDL-C >59 mg/dL is considered a negative risk factor for CHD.           Failed - Triglycerides in normal range and within 360 days    Triglycerides  Date Value Ref Range Status  10/20/2016 93 <150 mg/dL Final          Passed - Patient is not pregnant      Passed - Valid encounter  within last 12 months    Recent Outpatient Visits           1 month ago Psychophysiological insomnia   [facility], Lupita Raider, FNP   2 months ago Psychophysiological insomnia   [facility], Lupita Raider, FNP   8 months ago Psychophysiological insomnia   [facility], Lupita Raider, FNP   1 year ago Social isolation   [facility] Merrilyn Puma, Jerrel Ivory, NP   2 years ago PTSD (post-traumatic stress disorder)   [facility] Merrilyn Puma, Jerrel Ivory, NP                  lisinopril (ZESTRIL) 10 MG tablet [Pharmacy Med Name: LISINOPRIL 10 MG TABLET] 30 tablet 0    Sig: TAKE 1 TABLET BY MOUTH EVERY DAY      Cardiovascular:  ACE Inhibitors Failed - 02/23/2020  9:41 AM      Failed - Cr in normal range and within 180 days    Creat  Date Value Ref Range Status  10/20/2016 0.97 (H) 0.60 - 0.93 mg/dL Final    Comment:    For patients >  7 years of age, the reference limit for Creatinine is approximately 13% higher for people identified as African-American. .           Failed - K in normal range and within 180 days    Potassium  Date Value Ref Range Status  10/20/2016 4.0 3.5 - 5.3 mmol/L Final          Passed - Patient is not pregnant      Passed - Last BP in normal range    BP Readings from Last 1 Encounters:  01/14/20 135/70          Passed - Valid encounter within last 6 months    Recent Outpatient Visits           1 month ago Psychophysiological insomnia   [facility], Lupita Raider, FNP   2 months ago Psychophysiological insomnia   [facility], Lupita Raider, FNP   8 months ago Psychophysiological insomnia   [facility], FNP   1 year ago Social isolation   [facility] Merrilyn Puma, Jerrel Ivory, NP   2 years ago PTSD (post-traumatic stress disorder)   [facility] Mikey [employer],  NP

## 2020-02-23 NOTE — Telephone Encounter (Signed)
Patient called and advised of the need for a physical and labs, asked if she could be scheduled with another provider since Kathy Morton has left. She says she is leaving the practice and is seeking to find another provider. She says she would like the medications refilled to cover until she finds someone. I advised I will send them to Dr. Raliegh Ip, she verbalized understanding.

## 2020-02-26 DIAGNOSIS — M810 Age-related osteoporosis without current pathological fracture: Secondary | ICD-10-CM | POA: Diagnosis not present

## 2020-02-26 DIAGNOSIS — F1320 Sedative, hypnotic or anxiolytic dependence, uncomplicated: Secondary | ICD-10-CM | POA: Diagnosis not present

## 2020-02-26 DIAGNOSIS — F419 Anxiety disorder, unspecified: Secondary | ICD-10-CM | POA: Diagnosis not present

## 2020-02-26 DIAGNOSIS — I10 Essential (primary) hypertension: Secondary | ICD-10-CM | POA: Diagnosis not present

## 2020-02-26 DIAGNOSIS — E785 Hyperlipidemia, unspecified: Secondary | ICD-10-CM | POA: Diagnosis not present

## 2020-02-26 DIAGNOSIS — E669 Obesity, unspecified: Secondary | ICD-10-CM | POA: Diagnosis not present

## 2020-02-26 DIAGNOSIS — G4700 Insomnia, unspecified: Secondary | ICD-10-CM | POA: Diagnosis not present

## 2020-02-26 DIAGNOSIS — Z79899 Other long term (current) drug therapy: Secondary | ICD-10-CM | POA: Diagnosis not present

## 2020-02-26 DIAGNOSIS — G43909 Migraine, unspecified, not intractable, without status migrainosus: Secondary | ICD-10-CM | POA: Diagnosis not present

## 2020-04-16 ENCOUNTER — Other Ambulatory Visit: Payer: Self-pay | Admitting: Family Medicine

## 2020-04-16 DIAGNOSIS — I10 Essential (primary) hypertension: Secondary | ICD-10-CM

## 2020-04-16 DIAGNOSIS — E785 Hyperlipidemia, unspecified: Secondary | ICD-10-CM

## 2020-05-20 ENCOUNTER — Other Ambulatory Visit: Payer: Self-pay | Admitting: Family Medicine

## 2020-05-20 DIAGNOSIS — E785 Hyperlipidemia, unspecified: Secondary | ICD-10-CM

## 2020-05-20 DIAGNOSIS — I10 Essential (primary) hypertension: Secondary | ICD-10-CM

## 2020-05-20 NOTE — Telephone Encounter (Signed)
Requested medications are due for refill today yes  Requested medications are on the active medication list yes  Last refill 02/23/20  Last visit 06/2019, last lipid labs 2018, were ordered 06/2019 but not drawn  Future visit scheduled 11/2020  Notes to clinic Failed protocol due to no valid visit within 6  months, labs within 360 days, please assess.

## 2020-05-25 DIAGNOSIS — F1320 Sedative, hypnotic or anxiolytic dependence, uncomplicated: Secondary | ICD-10-CM | POA: Diagnosis not present

## 2020-05-25 DIAGNOSIS — M810 Age-related osteoporosis without current pathological fracture: Secondary | ICD-10-CM | POA: Diagnosis not present

## 2020-05-25 DIAGNOSIS — Z79899 Other long term (current) drug therapy: Secondary | ICD-10-CM | POA: Diagnosis not present

## 2020-05-25 DIAGNOSIS — E785 Hyperlipidemia, unspecified: Secondary | ICD-10-CM | POA: Diagnosis not present

## 2020-05-25 DIAGNOSIS — I10 Essential (primary) hypertension: Secondary | ICD-10-CM | POA: Diagnosis not present

## 2020-05-25 DIAGNOSIS — Z6834 Body mass index (BMI) 34.0-34.9, adult: Secondary | ICD-10-CM | POA: Diagnosis not present

## 2020-05-25 DIAGNOSIS — G43909 Migraine, unspecified, not intractable, without status migrainosus: Secondary | ICD-10-CM | POA: Diagnosis not present

## 2020-05-25 DIAGNOSIS — F419 Anxiety disorder, unspecified: Secondary | ICD-10-CM | POA: Diagnosis not present

## 2020-06-01 ENCOUNTER — Other Ambulatory Visit: Payer: Self-pay | Admitting: Family Medicine

## 2020-06-01 DIAGNOSIS — M810 Age-related osteoporosis without current pathological fracture: Secondary | ICD-10-CM

## 2020-06-09 ENCOUNTER — Ambulatory Visit
Admission: RE | Admit: 2020-06-09 | Discharge: 2020-06-09 | Disposition: A | Discharge status: Home / Self Care | Payer: Medicare PPO | Source: Ambulatory Visit | Attending: Family Medicine | Admitting: Family Medicine

## 2020-06-09 ENCOUNTER — Other Ambulatory Visit: Payer: Self-pay

## 2020-06-09 DIAGNOSIS — M810 Age-related osteoporosis without current pathological fracture: Secondary | ICD-10-CM | POA: Diagnosis not present

## 2020-06-09 DIAGNOSIS — Z780 Asymptomatic menopausal state: Secondary | ICD-10-CM | POA: Diagnosis not present

## 2020-06-09 DIAGNOSIS — M85851 Other specified disorders of bone density and structure, right thigh: Secondary | ICD-10-CM | POA: Diagnosis not present

## 2020-07-09 ENCOUNTER — Other Ambulatory Visit: Payer: Self-pay

## 2020-07-22 ENCOUNTER — Other Ambulatory Visit: Payer: Self-pay | Admitting: Family Medicine

## 2020-07-22 DIAGNOSIS — F5101 Primary insomnia: Secondary | ICD-10-CM

## 2020-08-09 DIAGNOSIS — E785 Hyperlipidemia, unspecified: Secondary | ICD-10-CM | POA: Diagnosis not present

## 2020-08-09 DIAGNOSIS — Z0000 Encounter for general adult medical examination without abnormal findings: Secondary | ICD-10-CM | POA: Diagnosis not present

## 2020-08-09 DIAGNOSIS — Z1331 Encounter for screening for depression: Secondary | ICD-10-CM | POA: Diagnosis not present

## 2020-08-09 DIAGNOSIS — Z9181 History of falling: Secondary | ICD-10-CM | POA: Diagnosis not present

## 2020-08-09 DIAGNOSIS — E669 Obesity, unspecified: Secondary | ICD-10-CM | POA: Diagnosis not present

## 2020-08-17 DIAGNOSIS — Z6833 Body mass index (BMI) 33.0-33.9, adult: Secondary | ICD-10-CM | POA: Diagnosis not present

## 2020-08-17 DIAGNOSIS — I10 Essential (primary) hypertension: Secondary | ICD-10-CM | POA: Diagnosis not present

## 2020-08-17 DIAGNOSIS — Z79899 Other long term (current) drug therapy: Secondary | ICD-10-CM | POA: Diagnosis not present

## 2020-08-17 DIAGNOSIS — G4700 Insomnia, unspecified: Secondary | ICD-10-CM | POA: Diagnosis not present

## 2020-08-17 DIAGNOSIS — F1320 Sedative, hypnotic or anxiolytic dependence, uncomplicated: Secondary | ICD-10-CM | POA: Diagnosis not present

## 2020-08-17 DIAGNOSIS — G43909 Migraine, unspecified, not intractable, without status migrainosus: Secondary | ICD-10-CM | POA: Diagnosis not present

## 2020-08-17 DIAGNOSIS — M810 Age-related osteoporosis without current pathological fracture: Secondary | ICD-10-CM | POA: Diagnosis not present

## 2020-08-17 DIAGNOSIS — F419 Anxiety disorder, unspecified: Secondary | ICD-10-CM | POA: Diagnosis not present

## 2020-08-17 DIAGNOSIS — E785 Hyperlipidemia, unspecified: Secondary | ICD-10-CM | POA: Diagnosis not present

## 2020-09-01 DIAGNOSIS — H26493 Other secondary cataract, bilateral: Secondary | ICD-10-CM | POA: Diagnosis not present

## 2020-11-04 ENCOUNTER — Ambulatory Visit: Payer: Medicare PPO

## 2020-12-14 DIAGNOSIS — E785 Hyperlipidemia, unspecified: Secondary | ICD-10-CM | POA: Diagnosis not present

## 2020-12-14 DIAGNOSIS — R61 Generalized hyperhidrosis: Secondary | ICD-10-CM | POA: Diagnosis not present

## 2020-12-14 DIAGNOSIS — Z79899 Other long term (current) drug therapy: Secondary | ICD-10-CM | POA: Diagnosis not present

## 2020-12-14 DIAGNOSIS — I10 Essential (primary) hypertension: Secondary | ICD-10-CM | POA: Diagnosis not present

## 2020-12-14 DIAGNOSIS — Z6836 Body mass index (BMI) 36.0-36.9, adult: Secondary | ICD-10-CM | POA: Diagnosis not present

## 2020-12-14 DIAGNOSIS — F419 Anxiety disorder, unspecified: Secondary | ICD-10-CM | POA: Diagnosis not present

## 2020-12-14 DIAGNOSIS — G4700 Insomnia, unspecified: Secondary | ICD-10-CM | POA: Diagnosis not present

## 2020-12-14 DIAGNOSIS — Z139 Encounter for screening, unspecified: Secondary | ICD-10-CM | POA: Diagnosis not present

## 2020-12-14 DIAGNOSIS — R0609 Other forms of dyspnea: Secondary | ICD-10-CM | POA: Diagnosis not present

## 2020-12-14 DIAGNOSIS — R635 Abnormal weight gain: Secondary | ICD-10-CM | POA: Diagnosis not present

## 2020-12-29 DIAGNOSIS — D649 Anemia, unspecified: Secondary | ICD-10-CM | POA: Diagnosis not present

## 2020-12-29 DIAGNOSIS — R000 Tachycardia, unspecified: Secondary | ICD-10-CM | POA: Diagnosis not present

## 2020-12-29 DIAGNOSIS — Z79899 Other long term (current) drug therapy: Secondary | ICD-10-CM | POA: Diagnosis not present

## 2020-12-29 DIAGNOSIS — R600 Localized edema: Secondary | ICD-10-CM | POA: Diagnosis not present

## 2020-12-29 DIAGNOSIS — R0609 Other forms of dyspnea: Secondary | ICD-10-CM | POA: Diagnosis not present

## 2020-12-30 ENCOUNTER — Ambulatory Visit
Admission: RE | Admit: 2020-12-30 | Discharge: 2020-12-30 | Disposition: A | Discharge status: Home / Self Care | Payer: Medicare PPO | Source: Ambulatory Visit | Attending: Family Medicine | Admitting: Family Medicine

## 2020-12-30 ENCOUNTER — Other Ambulatory Visit: Payer: Self-pay

## 2020-12-30 ENCOUNTER — Other Ambulatory Visit: Payer: Self-pay | Admitting: Family Medicine

## 2020-12-30 DIAGNOSIS — Z87440 Personal history of urinary (tract) infections: Secondary | ICD-10-CM | POA: Diagnosis not present

## 2020-12-30 DIAGNOSIS — R81 Glycosuria: Secondary | ICD-10-CM | POA: Diagnosis not present

## 2020-12-30 DIAGNOSIS — I10 Essential (primary) hypertension: Secondary | ICD-10-CM | POA: Diagnosis present

## 2020-12-30 DIAGNOSIS — Z20822 Contact with and (suspected) exposure to covid-19: Secondary | ICD-10-CM | POA: Diagnosis present

## 2020-12-30 DIAGNOSIS — M7121 Synovial cyst of popliteal space [Baker], right knee: Secondary | ICD-10-CM | POA: Diagnosis not present

## 2020-12-30 DIAGNOSIS — C786 Secondary malignant neoplasm of retroperitoneum and peritoneum: Secondary | ICD-10-CM | POA: Diagnosis present

## 2020-12-30 DIAGNOSIS — Z86718 Personal history of other venous thrombosis and embolism: Secondary | ICD-10-CM | POA: Diagnosis not present

## 2020-12-30 DIAGNOSIS — R079 Chest pain, unspecified: Secondary | ICD-10-CM | POA: Diagnosis not present

## 2020-12-30 DIAGNOSIS — G4700 Insomnia, unspecified: Secondary | ICD-10-CM | POA: Diagnosis present

## 2020-12-30 DIAGNOSIS — J9811 Atelectasis: Secondary | ICD-10-CM | POA: Diagnosis not present

## 2020-12-30 DIAGNOSIS — R109 Unspecified abdominal pain: Secondary | ICD-10-CM | POA: Diagnosis not present

## 2020-12-30 DIAGNOSIS — I071 Rheumatic tricuspid insufficiency: Secondary | ICD-10-CM | POA: Diagnosis present

## 2020-12-30 DIAGNOSIS — R000 Tachycardia, unspecified: Secondary | ICD-10-CM | POA: Diagnosis not present

## 2020-12-30 DIAGNOSIS — M7989 Other specified soft tissue disorders: Secondary | ICD-10-CM | POA: Diagnosis present

## 2020-12-30 DIAGNOSIS — R0609 Other forms of dyspnea: Secondary | ICD-10-CM

## 2020-12-30 DIAGNOSIS — E6609 Other obesity due to excess calories: Secondary | ICD-10-CM | POA: Diagnosis present

## 2020-12-30 DIAGNOSIS — G43909 Migraine, unspecified, not intractable, without status migrainosus: Secondary | ICD-10-CM | POA: Diagnosis present

## 2020-12-30 DIAGNOSIS — F32A Depression, unspecified: Secondary | ICD-10-CM | POA: Diagnosis present

## 2020-12-30 DIAGNOSIS — Z79899 Other long term (current) drug therapy: Secondary | ICD-10-CM | POA: Diagnosis not present

## 2020-12-30 DIAGNOSIS — J90 Pleural effusion, not elsewhere classified: Secondary | ICD-10-CM | POA: Diagnosis present

## 2020-12-30 DIAGNOSIS — R0789 Other chest pain: Secondary | ICD-10-CM | POA: Diagnosis not present

## 2020-12-30 DIAGNOSIS — N179 Acute kidney failure, unspecified: Secondary | ICD-10-CM | POA: Diagnosis not present

## 2020-12-30 DIAGNOSIS — Z6833 Body mass index (BMI) 33.0-33.9, adult: Secondary | ICD-10-CM | POA: Diagnosis not present

## 2020-12-30 DIAGNOSIS — R0689 Other abnormalities of breathing: Secondary | ICD-10-CM | POA: Diagnosis not present

## 2020-12-30 DIAGNOSIS — M85862 Other specified disorders of bone density and structure, left lower leg: Secondary | ICD-10-CM | POA: Diagnosis present

## 2020-12-30 DIAGNOSIS — E538 Deficiency of other specified B group vitamins: Secondary | ICD-10-CM | POA: Diagnosis present

## 2020-12-30 DIAGNOSIS — M85861 Other specified disorders of bone density and structure, right lower leg: Secondary | ICD-10-CM | POA: Diagnosis present

## 2020-12-30 DIAGNOSIS — C801 Malignant (primary) neoplasm, unspecified: Secondary | ICD-10-CM | POA: Diagnosis not present

## 2020-12-30 DIAGNOSIS — R0902 Hypoxemia: Secondary | ICD-10-CM | POA: Diagnosis not present

## 2020-12-30 DIAGNOSIS — R180 Malignant ascites: Secondary | ICD-10-CM | POA: Diagnosis present

## 2020-12-30 DIAGNOSIS — E876 Hypokalemia: Secondary | ICD-10-CM | POA: Diagnosis present

## 2020-12-30 DIAGNOSIS — Z8249 Family history of ischemic heart disease and other diseases of the circulatory system: Secondary | ICD-10-CM | POA: Diagnosis not present

## 2020-12-30 DIAGNOSIS — R0602 Shortness of breath: Secondary | ICD-10-CM | POA: Diagnosis not present

## 2020-12-30 DIAGNOSIS — D649 Anemia, unspecified: Secondary | ICD-10-CM | POA: Diagnosis present

## 2020-12-30 DIAGNOSIS — K5900 Constipation, unspecified: Secondary | ICD-10-CM | POA: Diagnosis not present

## 2020-12-30 DIAGNOSIS — R188 Other ascites: Secondary | ICD-10-CM | POA: Diagnosis not present

## 2020-12-30 DIAGNOSIS — F419 Anxiety disorder, unspecified: Secondary | ICD-10-CM | POA: Diagnosis present

## 2020-12-30 DIAGNOSIS — E871 Hypo-osmolality and hyponatremia: Secondary | ICD-10-CM | POA: Diagnosis present

## 2020-12-30 DIAGNOSIS — R600 Localized edema: Secondary | ICD-10-CM | POA: Diagnosis not present

## 2020-12-30 DIAGNOSIS — E785 Hyperlipidemia, unspecified: Secondary | ICD-10-CM | POA: Diagnosis present

## 2021-01-01 ENCOUNTER — Emergency Department: Payer: Medicare PPO

## 2021-01-01 ENCOUNTER — Encounter: Payer: Self-pay | Admitting: Family Medicine

## 2021-01-01 ENCOUNTER — Inpatient Hospital Stay: Payer: Medicare PPO

## 2021-01-01 ENCOUNTER — Inpatient Hospital Stay
Admission: EM | Admit: 2021-01-01 | Discharge: 2021-01-03 | DRG: 375 | Disposition: A | Discharge status: Home / Self Care | Payer: Medicare PPO | Attending: Internal Medicine | Admitting: Internal Medicine

## 2021-01-01 ENCOUNTER — Other Ambulatory Visit: Payer: Self-pay

## 2021-01-01 DIAGNOSIS — R600 Localized edema: Secondary | ICD-10-CM | POA: Diagnosis not present

## 2021-01-01 DIAGNOSIS — I071 Rheumatic tricuspid insufficiency: Secondary | ICD-10-CM | POA: Diagnosis present

## 2021-01-01 DIAGNOSIS — E538 Deficiency of other specified B group vitamins: Secondary | ICD-10-CM | POA: Diagnosis present

## 2021-01-01 DIAGNOSIS — R180 Malignant ascites: Secondary | ICD-10-CM | POA: Diagnosis present

## 2021-01-01 DIAGNOSIS — Z8249 Family history of ischemic heart disease and other diseases of the circulatory system: Secondary | ICD-10-CM | POA: Diagnosis not present

## 2021-01-01 DIAGNOSIS — D529 Folate deficiency anemia, unspecified: Secondary | ICD-10-CM

## 2021-01-01 DIAGNOSIS — G43909 Migraine, unspecified, not intractable, without status migrainosus: Secondary | ICD-10-CM | POA: Diagnosis present

## 2021-01-01 DIAGNOSIS — Z20822 Contact with and (suspected) exposure to covid-19: Secondary | ICD-10-CM | POA: Diagnosis present

## 2021-01-01 DIAGNOSIS — C786 Secondary malignant neoplasm of retroperitoneum and peritoneum: Principal | ICD-10-CM

## 2021-01-01 DIAGNOSIS — M85861 Other specified disorders of bone density and structure, right lower leg: Secondary | ICD-10-CM | POA: Diagnosis present

## 2021-01-01 DIAGNOSIS — J90 Pleural effusion, not elsewhere classified: Secondary | ICD-10-CM | POA: Diagnosis present

## 2021-01-01 DIAGNOSIS — N179 Acute kidney failure, unspecified: Secondary | ICD-10-CM

## 2021-01-01 DIAGNOSIS — M7989 Other specified soft tissue disorders: Secondary | ICD-10-CM | POA: Diagnosis present

## 2021-01-01 DIAGNOSIS — E871 Hypo-osmolality and hyponatremia: Secondary | ICD-10-CM | POA: Diagnosis present

## 2021-01-01 DIAGNOSIS — M85862 Other specified disorders of bone density and structure, left lower leg: Secondary | ICD-10-CM | POA: Diagnosis present

## 2021-01-01 DIAGNOSIS — R81 Glycosuria: Secondary | ICD-10-CM

## 2021-01-01 DIAGNOSIS — G4700 Insomnia, unspecified: Secondary | ICD-10-CM | POA: Diagnosis present

## 2021-01-01 DIAGNOSIS — R0609 Other forms of dyspnea: Secondary | ICD-10-CM | POA: Diagnosis not present

## 2021-01-01 DIAGNOSIS — F419 Anxiety disorder, unspecified: Secondary | ICD-10-CM | POA: Diagnosis present

## 2021-01-01 DIAGNOSIS — F32A Depression, unspecified: Secondary | ICD-10-CM | POA: Diagnosis present

## 2021-01-01 DIAGNOSIS — D649 Anemia, unspecified: Secondary | ICD-10-CM | POA: Diagnosis present

## 2021-01-01 DIAGNOSIS — E876 Hypokalemia: Secondary | ICD-10-CM | POA: Diagnosis present

## 2021-01-01 DIAGNOSIS — Z79899 Other long term (current) drug therapy: Secondary | ICD-10-CM | POA: Diagnosis not present

## 2021-01-01 DIAGNOSIS — E785 Hyperlipidemia, unspecified: Secondary | ICD-10-CM | POA: Diagnosis present

## 2021-01-01 DIAGNOSIS — R7989 Other specified abnormal findings of blood chemistry: Secondary | ICD-10-CM

## 2021-01-01 DIAGNOSIS — C801 Malignant (primary) neoplasm, unspecified: Secondary | ICD-10-CM | POA: Diagnosis not present

## 2021-01-01 DIAGNOSIS — E6609 Other obesity due to excess calories: Secondary | ICD-10-CM | POA: Diagnosis present

## 2021-01-01 DIAGNOSIS — R778 Other specified abnormalities of plasma proteins: Secondary | ICD-10-CM

## 2021-01-01 DIAGNOSIS — R188 Other ascites: Secondary | ICD-10-CM

## 2021-01-01 DIAGNOSIS — Z7189 Other specified counseling: Secondary | ICD-10-CM

## 2021-01-01 DIAGNOSIS — F329 Major depressive disorder, single episode, unspecified: Secondary | ICD-10-CM | POA: Diagnosis not present

## 2021-01-01 DIAGNOSIS — Z6833 Body mass index (BMI) 33.0-33.9, adult: Secondary | ICD-10-CM | POA: Diagnosis not present

## 2021-01-01 DIAGNOSIS — Z87440 Personal history of urinary (tract) infections: Secondary | ICD-10-CM | POA: Diagnosis not present

## 2021-01-01 DIAGNOSIS — Z86718 Personal history of other venous thrombosis and embolism: Secondary | ICD-10-CM

## 2021-01-01 DIAGNOSIS — I10 Essential (primary) hypertension: Secondary | ICD-10-CM

## 2021-01-01 DIAGNOSIS — R0602 Shortness of breath: Secondary | ICD-10-CM

## 2021-01-01 HISTORY — DX: Folate deficiency anemia, unspecified: D52.9

## 2021-01-01 LAB — URINALYSIS, COMPLETE (UACMP) WITH MICROSCOPIC
Bilirubin Urine: NEGATIVE
Glucose, UA: 50 mg/dL — AB
Ketones, ur: 5 mg/dL — AB
Nitrite: NEGATIVE
Protein, ur: 100 mg/dL — AB
Specific Gravity, Urine: 1.016 (ref 1.005–1.030)
pH: 5 (ref 5.0–8.0)

## 2021-01-01 LAB — PROTEIN, PLEURAL OR PERITONEAL FLUID: Total protein, fluid: 4 g/dL

## 2021-01-01 LAB — CBC WITH DIFFERENTIAL/PLATELET
Abs Immature Granulocytes: 0.03 10*3/uL (ref 0.00–0.07)
Basophils Absolute: 0 10*3/uL (ref 0.0–0.1)
Basophils Relative: 0 %
Eosinophils Absolute: 0.1 10*3/uL (ref 0.0–0.5)
Eosinophils Relative: 1 %
HCT: 28.8 % — ABNORMAL LOW (ref 36.0–46.0)
Hemoglobin: 10.8 g/dL — ABNORMAL LOW (ref 12.0–15.0)
Immature Granulocytes: 0 %
Lymphocytes Relative: 8 %
Lymphs Abs: 0.9 10*3/uL (ref 0.7–4.0)
MCH: 33.9 pg (ref 26.0–34.0)
MCHC: 37.5 g/dL — ABNORMAL HIGH (ref 30.0–36.0)
MCV: 90.3 fL (ref 80.0–100.0)
Monocytes Absolute: 1.3 10*3/uL — ABNORMAL HIGH (ref 0.1–1.0)
Monocytes Relative: 12 %
Neutro Abs: 8.7 10*3/uL — ABNORMAL HIGH (ref 1.7–7.7)
Neutrophils Relative %: 79 %
Platelets: 556 10*3/uL — ABNORMAL HIGH (ref 150–400)
RBC: 3.19 MIL/uL — ABNORMAL LOW (ref 3.87–5.11)
RDW: 15.9 % — ABNORMAL HIGH (ref 11.5–15.5)
WBC: 11 10*3/uL — ABNORMAL HIGH (ref 4.0–10.5)
nRBC: 0 % (ref 0.0–0.2)

## 2021-01-01 LAB — COMPREHENSIVE METABOLIC PANEL
ALT: 10 U/L (ref 0–44)
AST: 21 U/L (ref 15–41)
Albumin: 3 g/dL — ABNORMAL LOW (ref 3.5–5.0)
Alkaline Phosphatase: 64 U/L (ref 38–126)
Anion gap: 10 (ref 5–15)
BUN: 23 mg/dL (ref 8–23)
CO2: 21 mmol/L — ABNORMAL LOW (ref 22–32)
Calcium: 8.5 mg/dL — ABNORMAL LOW (ref 8.9–10.3)
Chloride: 94 mmol/L — ABNORMAL LOW (ref 98–111)
Creatinine, Ser: 1.13 mg/dL — ABNORMAL HIGH (ref 0.44–1.00)
GFR, Estimated: 51 mL/min — ABNORMAL LOW (ref >60)
Glucose, Bld: 144 mg/dL — ABNORMAL HIGH (ref 70–99)
Potassium: 2.9 mmol/L — ABNORMAL LOW (ref 3.5–5.1)
Sodium: 125 mmol/L — ABNORMAL LOW (ref 135–145)
Total Bilirubin: 0.5 mg/dL (ref 0.3–1.2)
Total Protein: 6.6 g/dL (ref 6.5–8.1)

## 2021-01-01 LAB — RESP PANEL BY RT-PCR (FLU A&B, COVID) ARPGX2
Influenza A by PCR: NEGATIVE
Influenza B by PCR: NEGATIVE
SARS Coronavirus 2 by RT PCR: NEGATIVE

## 2021-01-01 LAB — BRAIN NATRIURETIC PEPTIDE: B Natriuretic Peptide: 243.5 pg/mL — ABNORMAL HIGH (ref 0.0–100.0)

## 2021-01-01 LAB — TSH: TSH: 2.327 u[IU]/mL (ref 0.350–4.500)

## 2021-01-01 LAB — IRON AND TIBC
Iron: 33 ug/dL (ref 28–170)
Saturation Ratios: 15 % (ref 10.4–31.8)
TIBC: 218 ug/dL — ABNORMAL LOW (ref 250–450)
UIBC: 185 ug/dL

## 2021-01-01 LAB — FERRITIN: Ferritin: 354 ng/mL — ABNORMAL HIGH (ref 11–307)

## 2021-01-01 LAB — BODY FLUID CELL COUNT WITH DIFFERENTIAL
Eos, Fluid: 0 %
Lymphs, Fluid: 30 %
Monocyte-Macrophage-Serous Fluid: 14 %
Neutrophil Count, Fluid: 2 %
Other Cells, Fluid: 54 %
Total Nucleated Cell Count, Fluid: 849 cu mm

## 2021-01-01 LAB — SODIUM, URINE, RANDOM: Sodium, Ur: 12 mmol/L

## 2021-01-01 LAB — MAGNESIUM: Magnesium: 1.8 mg/dL (ref 1.7–2.4)

## 2021-01-01 LAB — TROPONIN I (HIGH SENSITIVITY)
Troponin I (High Sensitivity): 22 ng/L — ABNORMAL HIGH (ref <18)
Troponin I (High Sensitivity): 19 ng/L — ABNORMAL HIGH (ref <18)

## 2021-01-01 LAB — ALBUMIN, PLEURAL OR PERITONEAL FLUID: Albumin, Fluid: 2.1 g/dL

## 2021-01-01 LAB — LIPASE, BLOOD: Lipase: 38 U/L (ref 11–51)

## 2021-01-01 LAB — OSMOLALITY: Osmolality: 271 mOsm/kg — ABNORMAL LOW (ref 275–295)

## 2021-01-01 LAB — D-DIMER, QUANTITATIVE: D-Dimer, Quant: >20 ug/mL-FEU — ABNORMAL HIGH (ref 0.00–0.50)

## 2021-01-01 LAB — OSMOLALITY, URINE: Osmolality, Ur: 359 mOsm/kg (ref 300–900)

## 2021-01-01 MED ORDER — ONDANSETRON HCL 4 MG/2ML IJ SOLN: 4.0000 mg | Freq: Four times a day (QID) | INTRAMUSCULAR | Status: DC | PRN

## 2021-01-01 MED ORDER — IOHEXOL 350 MG/ML SOLN
100.0000 mL | Freq: Once | INTRAVENOUS | Status: AC | PRN
  Administered 2021-01-01: 100 mL via INTRAVENOUS

## 2021-01-01 MED ORDER — ENOXAPARIN SODIUM 40 MG/0.4ML IJ SOSY
40.0000 mg | PREFILLED_SYRINGE | INTRAMUSCULAR | Status: DC
  Administered 2021-01-01 – 2021-01-02 (×2): 40 mg via SUBCUTANEOUS
  Filled 2021-01-01 (×2): qty 0.4

## 2021-01-01 MED ORDER — ACETAMINOPHEN 325 MG PO TABS
650.0000 mg | ORAL_TABLET | Freq: Four times a day (QID) | ORAL | Status: DC | PRN
  Administered 2021-01-02: 650 mg via ORAL
  Filled 2021-01-01: qty 2

## 2021-01-01 MED ORDER — POTASSIUM CHLORIDE CRYS ER 20 MEQ PO TBCR
40.0000 meq | EXTENDED_RELEASE_TABLET | Freq: Once | ORAL | Status: AC
  Administered 2021-01-01: 40 meq via ORAL
  Filled 2021-01-01: qty 2

## 2021-01-01 MED ORDER — POTASSIUM CHLORIDE CRYS ER 20 MEQ PO TBCR
40.0000 meq | EXTENDED_RELEASE_TABLET | Freq: Two times a day (BID) | ORAL | Status: AC
  Administered 2021-01-01: 40 meq via ORAL
  Administered 2021-01-02: 20 meq via ORAL
  Filled 2021-01-01 (×2): qty 2

## 2021-01-01 MED ORDER — ACETAMINOPHEN 650 MG RE SUPP: 650.0000 mg | Freq: Four times a day (QID) | RECTAL | Status: DC | PRN

## 2021-01-01 MED ORDER — QUETIAPINE FUMARATE 25 MG PO TABS
100.0000 mg | ORAL_TABLET | Freq: Every day | ORAL | Status: DC
  Administered 2021-01-01 – 2021-01-02 (×2): 100 mg via ORAL
  Filled 2021-01-01 (×2): qty 4

## 2021-01-01 MED ORDER — ATORVASTATIN CALCIUM 20 MG PO TABS
20.0000 mg | ORAL_TABLET | Freq: Every day | ORAL | Status: DC
  Administered 2021-01-01 – 2021-01-03 (×3): 20 mg via ORAL
  Filled 2021-01-01 (×3): qty 1

## 2021-01-01 MED ORDER — ONDANSETRON HCL 4 MG PO TABS: 4.0000 mg | ORAL_TABLET | Freq: Four times a day (QID) | ORAL | Status: DC | PRN

## 2021-01-01 MED ORDER — ALBUMIN HUMAN 25 % IV SOLN
50.0000 g | Freq: Once | INTRAVENOUS | Status: AC
  Administered 2021-01-01: 50 g via INTRAVENOUS
  Filled 2021-01-01: qty 200

## 2021-01-01 NOTE — Hospital Course (Addendum)
Kathy Morton is a 75 y.o. 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  In the ER, she was swollen.  CT showed peritoneal carcinomatosis, unclear primary, tense ascites.  CTA chest ruled out PE.  Na 125, K 2.9. Dimer elevated.   12/31: Admitted and had paracentesis of 12L fluid, albumin given, cytology sent 1/1: Given IV Lasix

## 2021-01-01 NOTE — ED Notes (Signed)
ED TO INPATIENT HANDOFF REPORT  ED Nurse Name and Phone #: Baxter Flattery, RN  S Name/Age/Gender Kathy Morton 75 y.o. female Room/Bed: ED19A/ED19A  Code Status   Code Status: Not on file  Home/SNF/Other Home Patient oriented to: self, place, time, and situation Is this baseline? Yes   Triage Complete: Triage complete  Chief Complaint Ascites [R18.8]  Triage Note Pt was brought from home via EMS. Pt states she started feeling short of breath on Wednesday. Was seen at her PCP and received an xray. Pt states that her breathing has gotten worse since Wednesday. This pt is on 2L of xygen with oxygen saturations of 97%.  Room air saturation is 98% at this time.     Allergies No Known Allergies  Level of Care/Admitting Diagnosis ED Disposition     ED Disposition  Admit   Condition  --   [place]: [place] [[id number]]  Level of Care: Med-Surg [16]  Covid Evaluation: Asymptomatic Screening Protocol (No Symptoms)  Diagnosis: Ascites [[id number]]  Admitting Physician: Edwin Dada [[id number]]  Attending Physician: Edwin Dada [[id number]]  Estimated length of stay: past midnight tomorrow  Certification:: I certify this patient will need inpatient services for at least 2 midnights          B Medical/Surgery History Past Medical History:  Diagnosis Date   Anxiety    Depression    DVT of leg (deep venous thrombosis) ([place])    Dysuria    History of blood clots    Hypertension    Incontinence    Migraine    Recurrent UTI    Past Surgical History:  Procedure Laterality Date   BREAST BIOPSY Left 01/06/13   benign finding of sclerosing adenosis   COLONOSCOPY WITH PROPOFOL N/A 11/29/2015   Procedure: COLONOSCOPY WITH PROPOFOL;  Surgeon: Jonathon Bellows, MD;  Location: [facility] ENDOSCOPY;  Service: Endoscopy;  Laterality: N/A;   KNEE SURGERY Left 2010   torn meniscus   TONSILECTOMY, ADENOIDECTOMY, BILATERAL MYRINGOTOMY AND TUBES        A IV Location/Drains/Wounds Patient Lines/Drains/Airways Status     Active Line/Drains/Airways     Name Placement date Placement time Site Days   Peripheral IV 11/29/15 Left Antecubital 11/29/15  0735  Antecubital  1860   Airway 11/29/15  0754  -- 1860            Intake/Output Last 24 hours No intake or output data in the 24 hours ending 01/01/21 1130  Labs/Imaging Results for orders placed or performed during the hospital encounter of 01/01/21 (from the past 48 hour(s))  Resp Panel by RT-PCR (Flu A&B, Covid) Nasopharyngeal Swab     Status: None   Collection Time: 01/01/21  2:04 AM   Specimen: Nasopharyngeal Swab; Nasopharyngeal(NP) swabs in vial transport medium  Result Value Ref Range   SARS Coronavirus 2 by RT PCR NEGATIVE NEGATIVE    Comment: (NOTE) SARS-CoV-2 target nucleic acids are NOT DETECTED.  The SARS-CoV-2 RNA is generally detectable in upper respiratory specimens during the acute phase of infection. The lowest concentration of SARS-CoV-2 viral copies this assay can detect is 138 copies/mL. A negative result does not preclude SARS-Cov-2 infection and should not be used as the sole basis for treatment or other patient management decisions. A negative result may occur with  improper specimen collection/handling, submission of specimen other than nasopharyngeal swab, presence of viral mutation(s) within the areas targeted by this assay, and inadequate number of viral copies(<138 copies/mL). A negative  result must be combined with clinical observations, patient history, and epidemiological information. The expected result is Negative.  Fact Sheet for Patients:  EntrepreneurPulse.com.au  Fact Sheet for Healthcare Providers:  IncredibleEmployment.be  This test is no t yet approved or cleared by the [place] FDA and  has been authorized for detection and/or diagnosis of SARS-CoV-2 by FDA under an Emergency Use  Authorization (EUA). This EUA will remain  in effect (meaning this test can be used) for the duration of the COVID-19 declaration under Section 564(b)(1) of the Act, 21 U.S.C.section 360bbb-3(b)(1), unless the authorization is terminated  or revoked sooner.       Influenza A by PCR NEGATIVE NEGATIVE   Influenza B by PCR NEGATIVE NEGATIVE    Comment: (NOTE) The Xpert Xpress SARS-CoV-2/FLU/RSV plus assay is intended as an aid in the diagnosis of influenza from Nasopharyngeal swab specimens and should not be used as a sole basis for treatment. Nasal washings and aspirates are unacceptable for Xpert Xpress SARS-CoV-2/FLU/RSV testing.  Fact Sheet for Patients: EntrepreneurPulse.com.au  Fact Sheet for Healthcare Providers: IncredibleEmployment.be  This test is not yet approved or cleared by the [place] FDA and has been authorized for detection and/or diagnosis of SARS-CoV-2 by FDA under an Emergency Use Authorization (EUA). This EUA will remain in effect (meaning this test can be used) for the duration of the COVID-19 declaration under Section 564(b)(1) of the Act, 21 U.S.C. section 360bbb-3(b)(1), unless the authorization is terminated or revoked.  Performed at [facility], [place], [place] [address]   Troponin I (High Sensitivity)     Status: Abnormal   Collection Time: 01/01/21  2:08 AM  Result Value Ref Range   Troponin I (High Sensitivity) 22 (H) <18 ng/L    Comment: (NOTE) Elevated high sensitivity troponin I (hsTnI) values and significant  changes across serial measurements may suggest ACS but many other  chronic and acute conditions are known to elevate hsTnI results.  Refer to the "Links" section for chest pain algorithms and additional  guidance. Performed at [facility], [address] [place], [place] [address]   Comprehensive metabolic panel     Status: Abnormal   Collection Time:  01/01/21  2:08 AM  Result Value Ref Range   Sodium 125 (L) 135 - 145 mmol/L   Potassium 2.9 (L) 3.5 - 5.1 mmol/L   Chloride 94 (L) 98 - 111 mmol/L   CO2 21 (L) 22 - 32 mmol/L   Glucose, Bld 144 (H) 70 - 99 mg/dL    Comment: Glucose reference range applies only to samples taken after fasting for at least 8 hours.   BUN 23 8 - 23 mg/dL   Creatinine, Ser 1.13 (H) 0.44 - 1.00 mg/dL   Calcium 8.5 (L) 8.9 - 10.3 mg/dL   Total Protein 6.6 6.5 - 8.1 g/dL   Albumin 3.0 (L) 3.5 - 5.0 g/dL   AST 21 15 - 41 U/L   ALT 10 0 - 44 U/L   Alkaline Phosphatase 64 38 - 126 U/L   Total Bilirubin 0.5 0.3 - 1.2 mg/dL   GFR, Estimated 51 (L) >60 mL/min    Comment: (NOTE) Calculated using the CKD-EPI Creatinine Equation (2021)    Anion gap 10 5 - 15    Comment: Performed at [facility], [address], [place], [place] [address]  CBC with Differential     Status: Abnormal   Collection Time: 01/01/21  2:08 AM  Result Value Ref Range  WBC 11.0 (H) 4.0 - 10.5 K/uL   RBC 3.19 (L) 3.87 - 5.11 MIL/uL   Hemoglobin 10.8 (L) 12.0 - 15.0 g/dL   HCT 28.8 (L) 36.0 - 46.0 %   MCV 90.3 80.0 - 100.0 fL   MCH 33.9 26.0 - 34.0 pg   MCHC 37.5 (H) 30.0 - 36.0 g/dL   RDW 15.9 (H) 11.5 - 15.5 %   Platelets 556 (H) 150 - 400 K/uL   nRBC 0.0 0.0 - 0.2 %   Neutrophils Relative % 79 %   Neutro Abs 8.7 (H) 1.7 - 7.7 K/uL   Lymphocytes Relative 8 %   Lymphs Abs 0.9 0.7 - 4.0 K/uL   Monocytes Relative 12 %   Monocytes Absolute 1.3 (H) 0.1 - 1.0 K/uL   Eosinophils Relative 1 %   Eosinophils Absolute 0.1 0.0 - 0.5 K/uL   Basophils Relative 0 %   Basophils Absolute 0.0 0.0 - 0.1 K/uL   Immature Granulocytes 0 %   Abs Immature Granulocytes 0.03 0.00 - 0.07 K/uL    Comment: Performed at [facility], [address] [place], [place] [address]  D-dimer, quantitative     Status: Abnormal   Collection Time: 01/01/21  2:08 AM  Result Value Ref Range   D-Dimer, Quant >20.00 (H) 0.00 - 0.50 ug/mL-FEU     Comment: (NOTE) At the manufacturer cut-off value of 0.5 g/mL FEU, this assay has a negative predictive value of 95-100%.This assay is intended for use in conjunction with a clinical pretest probability (PTP) assessment model to exclude pulmonary embolism (PE) and deep venous thrombosis (DVT) in outpatients suspected of PE or DVT. Results should be correlated with clinical presentation. Performed at [facility], [address] [place], [place] [address]   Brain natriuretic peptide     Status: Abnormal   Collection Time: 01/01/21  2:08 AM  Result Value Ref Range   B Natriuretic Peptide 243.5 (H) 0.0 - 100.0 pg/mL    Comment: Performed at [facility], [address] [place], [place] [address]  Lipase, blood     Status: None   Collection Time: 01/01/21  3:00 AM  Result Value Ref Range   Lipase 38 11 - 51 U/L    Comment: Performed at [facility], [place], [place] [address]  Troponin I (High Sensitivity)     Status: Abnormal   Collection Time: 01/01/21  8:46 AM  Result Value Ref Range   Troponin I (High Sensitivity) 19 (H) <18 ng/L    Comment: (NOTE) Elevated high sensitivity troponin I (hsTnI) values and significant  changes across serial measurements may suggest ACS but many other  chronic and acute conditions are known to elevate hsTnI results.  Refer to the "Links" section for chest pain algorithms and additional  guidance. Performed at [facility], [address] [place], [place] [address]   Magnesium     Status: None   Collection Time: 01/01/21  8:46 AM  Result Value Ref Range   Magnesium 1.8 1.7 - 2.4 mg/dL    Comment: Performed at [facility], [address] [place], [place] [address]  Urinalysis, Complete w Microscopic Urine, Clean Catch     Status: Abnormal   Collection Time: 01/01/21  9:32 AM  Result Value Ref Range   Color, Urine YELLOW (A) YELLOW   APPearance HAZY (A) CLEAR   Specific  Gravity, Urine 1.016 1.005 - 1.030   pH 5.0 5.0 - 8.0  Glucose, UA 50 (A) NEGATIVE mg/dL   Hgb urine dipstick SMALL (A) NEGATIVE   Bilirubin Urine NEGATIVE NEGATIVE   Ketones, ur 5 (A) NEGATIVE mg/dL   Protein, ur 100 (A) NEGATIVE mg/dL   Nitrite NEGATIVE NEGATIVE   Leukocytes,Ua SMALL (A) NEGATIVE   RBC / HPF 0-5 0 - 5 RBC/hpf   WBC, UA 21-50 0 - 5 WBC/hpf   Bacteria, UA RARE (A) NONE SEEN   Squamous Epithelial / LPF 0-5 0 - 5   Mucus PRESENT    Hyaline Casts, UA PRESENT    Non Squamous Epithelial PRESENT (A) NONE SEEN    Comment: Performed at [facility], [address], [place], [place] [address]   DG Chest 2 View  Result Date: 01/01/2021 CLINICAL DATA:  Shortness of breath. EXAM: CHEST - 2 VIEW COMPARISON:  PA Lat 12/30/2020. FINDINGS: The cardiac size is normal. There is mild aortic uncoiling, small amount of calcification. There are small pleural effusions collecting posteriorly, which was seen previously. The lungs are expiratory with very limited visualization of the lower lung fields. The visualized aerated lungs are clear. Osteopenia. IMPRESSION: Limited expiratory study. The lower lung fields are poorly evaluated. Visualized aerated lungs are clear. There are small pleural effusions. Overall aeration seems unchanged. Electronically Signed   By: Telford Nab M.D.   On: 01/01/2021 02:50   DG Chest 2 View  Result Date: 12/30/2020 CLINICAL DATA:  Shortness of breath with exertion for the past 2 weeks. EXAM: CHEST - 2 VIEW COMPARISON:  None. FINDINGS: The heart size and mediastinal contours are within normal limits. Normal pulmonary vascularity. Low lung volumes. Trace bilateral pleural effusions. No consolidation or pneumothorax. No acute osseous abnormality. IMPRESSION: 1. Low lung volumes with trace bilateral pleural effusions. Electronically Signed   By: Titus Dubin M.D.   On: 12/30/2020 12:42   CT Angio Chest PE W and/or Wo Contrast  Result Date:  01/01/2021 CLINICAL DATA:  Acute, nonlocalized abdominal pain. Shortness of breath for 1 week. EXAM: CT ANGIOGRAPHY CHEST CT ABDOMEN AND PELVIS WITH CONTRAST TECHNIQUE: Multidetector CT imaging of the chest was performed using the standard protocol during bolus administration of intravenous contrast. Multiplanar CT image reconstructions and MIPs were obtained to evaluate the vascular anatomy. Multidetector CT imaging of the abdomen and pelvis was performed using the standard protocol during bolus administration of intravenous contrast. CONTRAST:  196mL OMNIPAQUE IOHEXOL 350 MG/ML SOLN COMPARISON:  Abdominal CT 05/26/2015 FINDINGS: CTA CHEST FINDINGS Cardiovascular: Satisfactory opacification of the pulmonary arteries to the segmental level on second injection, first being severely degraded by bolus dispersion. No evidence of pulmonary embolism. Normal heart size. No pericardial effusion. Coronary atherosclerosis. Mediastinum/Nodes: No mass or adenopathy. Lungs/Pleura: Small pleural effusions on the right more than left with atelectasis. Musculoskeletal: No acute or aggressive finding Review of the MIP images confirms the above findings. CT ABDOMEN and PELVIS FINDINGS Hepatobiliary: Compressed liver due to the size of ascites. Cholelithiasis without acute cholecystitis or ductal dilatation Pancreas: No acute finding.  No inflammation or mass seen Spleen: Negative Adrenals/Urinary Tract: Negative adrenals and kidneys. Largely collapsed bladder. Stomach/Bowel: No obstruction, inflammation, or visible mass Vascular/Lymphatic: Atheromatous calcification. Nodularity in the left groin which is likely tumor within the remnant inguinal canal. Reproductive: Asymmetric heterogeneous thickening of the left ovary measuring 3.3 cm. Other: Large volume ascites with tense abdomen. Omental caking especially below the transverse colon. Nodularity also seen in the pelvic peritoneal recesses. Anasarca. Musculoskeletal: No acute  finding Review of the MIP images confirms  the above findings. IMPRESSION: 1. 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 A primary is not certain, but there is notable asymmetric thickening and heterogeneity of the left ovary (primary versus metastatic deposits). 2. Low volume chest with small pleural effusions and mild atelectasis. Negative for pulmonary embolism. Electronically Signed   By: Jorje Guild M.D.   On: 01/01/2021 10:04   CT ABDOMEN PELVIS W CONTRAST  Result Date: 01/01/2021 CLINICAL DATA:  Acute, nonlocalized abdominal pain. Shortness of breath for 1 week. EXAM: CT ANGIOGRAPHY CHEST CT ABDOMEN AND PELVIS WITH CONTRAST TECHNIQUE: Multidetector CT imaging of the chest was performed using the standard protocol during bolus administration of intravenous contrast. Multiplanar CT image reconstructions and MIPs were obtained to evaluate the vascular anatomy. Multidetector CT imaging of the abdomen and pelvis was performed using the standard protocol during bolus administration of intravenous contrast. CONTRAST:  140mL OMNIPAQUE IOHEXOL 350 MG/ML SOLN COMPARISON:  Abdominal CT 05/26/2015 FINDINGS: CTA CHEST FINDINGS Cardiovascular: Satisfactory opacification of the pulmonary arteries to the segmental level on second injection, first being severely degraded by bolus dispersion. No evidence of pulmonary embolism. Normal heart size. No pericardial effusion. Coronary atherosclerosis. Mediastinum/Nodes: No mass or adenopathy. Lungs/Pleura: Small pleural effusions on the right more than left with atelectasis. Musculoskeletal: No acute or aggressive finding Review of the MIP images confirms the above findings. CT ABDOMEN and PELVIS FINDINGS Hepatobiliary: Compressed liver due to the size of ascites. Cholelithiasis without acute cholecystitis or ductal dilatation Pancreas: No acute finding.  No inflammation or mass seen Spleen: Negative Adrenals/Urinary Tract: Negative adrenals and  kidneys. Largely collapsed bladder. Stomach/Bowel: No obstruction, inflammation, or visible mass Vascular/Lymphatic: Atheromatous calcification. Nodularity in the left groin which is likely tumor within the remnant inguinal canal. Reproductive: Asymmetric heterogeneous thickening of the left ovary measuring 3.3 cm. Other: Large volume ascites with tense abdomen. Omental caking especially below the transverse colon. Nodularity also seen in the pelvic peritoneal recesses. Anasarca. Musculoskeletal: No acute finding Review of the MIP images confirms the above findings. IMPRESSION: 1. 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 A primary is not certain, but there is notable asymmetric thickening and heterogeneity of the left ovary (primary versus metastatic deposits). 2. Low volume chest with small pleural effusions and mild atelectasis. Negative for pulmonary embolism. Electronically Signed   By: Jorje Guild M.D.   On: 01/01/2021 10:04   DG Abd Portable 2 Views  Result Date: 01/01/2021 CLINICAL DATA:  Abdominal distension EXAM: PORTABLE ABDOMEN - 2 VIEW COMPARISON:  05/26/2015 FINDINGS: Gas and stool throughout the colon with relative sparing of the descending colon, but no convincing obstruction. No concerning mass effect or calcification. Lumbar spine degeneration and mild scoliosis. IMPRESSION: Moderate distension of the colon by gas and stool Electronically Signed   By: Jorje Guild M.D.   On: 01/01/2021 04:03    Pending Labs Unresulted Labs (From admission, onward)     Start     Ordered   01/01/21 1056  CEA  Add-on,   AD        01/01/21 1055            Vitals/Pain Today's Vitals   01/01/21 0559 01/01/21 0839 01/01/21 1000 01/01/21 1030  BP: 131/82 131/72 (!) 144/88 (!) 142/97  Pulse: (!) 102 96 (!) 134 93  Resp: (!) 22 (!) 22 (!) 21 (!) 22  Temp:  97.8 F (36.6 C)    TempSrc:  Oral    SpO2: 99% 99% 90% 98%  PainSc:  Isolation  Precautions Airborne and Contact precautions  Medications Medications  iohexol (OMNIPAQUE) 350 MG/ML injection 100 mL (100 mLs Intravenous Contrast Given 01/01/21 0901)  potassium chloride SA (KLOR-CON M) CR tablet 40 mEq (40 mEq Oral Given 01/01/21 0931)    Mobility walks     Focused Assessments Pulmonary Assessment Handoff:  Lung sounds: Bilateral Breath Sounds: (S) Expiratory wheezes L Breath Sounds: (S) Expiratory wheezes O2 Device: Room Air     R Recommendations: See Admitting Provider Note  Report given to:   Additional Notes:

## 2021-01-01 NOTE — Assessment & Plan Note (Signed)
Continue atorvastatin

## 2021-01-01 NOTE — Assessment & Plan Note (Addendum)
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  Paracentesis on 12/31 of 12L.  Still some ascites, and marked lower extremity swelling.  - Follow echo - Start IV Lasix - Check BMP tomorrow

## 2021-01-01 NOTE — ED Provider Notes (Signed)
[facility] Emergency Department Provider Note  ____________________________________________   Event Date/Time   First MD Initiated Contact with Patient 01/01/21 [phone]     (approximate)  I have reviewed the triage vital signs and the nursing notes.   HISTORY  Chief Complaint Shortness of Breath   HPI Kathy Morton is a 75 y.o. female with below noted past medical history who presents for assessment of several symptoms including shortness of breath, abdominal distention and leg swelling.  Patient also endorses some mild cough over the last week.  She states the leg swelling and abdominal distention started around 12/28.  Endorses a little bit of diarrhea that is nonbloody.  She denies any chest pain other than feeling like the top of her abdomen is stretching her skin very tight.  No fevers, headache, earache, sore throat, vomiting, urinary symptoms or extremity pain.  States that she had a DVT several years ago but is not currently on anticoagulation.  Denies any history of CHF.           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   Hyponatremia 01/01/2021   Hypokalemia 01/01/2021   Anemia 01/01/2021   Leg swelling 01/01/2021   Class 1 obesity due to excess calories without serious comorbidity with body mass index (BMI) of 31.0 to 31.9 in adult 12/02/2019   Anxiety 06/17/2019   Depression 08/19/2018   Binge eating disorder 06/01/2017   Cold sore 10/25/2016   Osteopenia of femoral neck, bilateral 04/26/2016   Benign paroxysmal positional vertigo 12/22/2015   Positive occult stool blood test    Internal hemorrhoids    Colon cancer screening 11/12/2015    Vaginal atrophy 05/22/2015   Hyperlipidemia 12/14/2014   Hypertension 12/14/2014   Chronic constipation 12/14/2014   Insomnia 12/14/2014   PTSD (post-traumatic stress disorder) 11/09/2014   Class 1 obesity due to excess calories with body mass index (BMI) of 33.0 to 33.9 in adult 11/09/2014   Migraines 11/09/2014   History of DVT (deep vein thrombosis) 11/09/2014    Past Surgical History:  Procedure Laterality Date   BREAST BIOPSY Left 01/06/13   benign finding of sclerosing adenosis   COLONOSCOPY WITH PROPOFOL N/A 11/29/2015   Procedure: COLONOSCOPY WITH PROPOFOL;  Surgeon: Jonathon Bellows, MD;  Location: [facility] ENDOSCOPY;  Service: Endoscopy;  Laterality: N/A;   KNEE SURGERY Left 2010   torn meniscus   TONSILECTOMY, ADENOIDECTOMY, BILATERAL MYRINGOTOMY AND TUBES      Prior to Admission medications   Medication Sig Start Date End Date Taking? Authorizing Provider  atorvastatin (LIPITOR) 20 MG tablet TAKE 1 TABLET BY MOUTH EVERY DAY 05/20/20  Yes Baity, Coralie Keens, NP  furosemide (LASIX) 40 MG tablet Take 40 mg by mouth.   Yes [provider]  lisinopril (ZESTRIL) 10 MG tablet TAKE 1 TABLET BY MOUTH EVERY DAY 05/20/20  Yes Baity, Coralie Keens, NP  QUEtiapine (SEROQUEL) 100 MG tablet Take 1 tablet (100 mg total) by mouth at bedtime. 02/11/20  Yes Karamalegos, Devonne Doughty, DO  SUMAtriptan (IMITREX) 100 MG tablet May repeat in 2 hours if headache persists or recurs.  06/17/19  Yes Malfi, Lupita Raider, FNP  clonazePAM (KLONOPIN) 1 MG tablet Take 1 tablet (1 mg total) by mouth 2 (two) times daily as needed for anxiety. Patient not taking: Reported on 01/01/2021 02/13/20   Olin Hauser, DO  polyethylene glycol powder [facility]) 17 GM/SCOOP powder Use as directed 02/15/09   [provider]    Allergies Patient has no known allergies.  Family History  Problem Relation Age of Onset   Bladder Cancer Mother    Heart attack Father    Breast cancer Maternal Grandmother 80    Kidney disease Neg Hx     Social History Social History   Tobacco Use   Smoking status: Never   Smokeless tobacco: Never  Vaping Use   Vaping Use: Never used  Substance Use Topics   Alcohol use: No   Drug use: No    Review of Systems  Review of Systems  Constitutional:  Negative for chills and fever.  HENT:  Negative for sore throat.   Eyes:  Negative for pain.  Respiratory:  Positive for cough and shortness of breath. Negative for stridor.   Cardiovascular:  Negative for chest pain.  Gastrointestinal:  Positive for diarrhea, nausea and vomiting.  Skin:  Negative for rash.  Neurological:  Negative for seizures, loss of consciousness and headaches.  Psychiatric/Behavioral:  Negative for suicidal ideas.   All other systems reviewed and are negative.    ____________________________________________   PHYSICAL EXAM:  VITAL SIGNS: ED Triage Vitals  Enc Vitals Group     BP 01/01/21 0202 114/82     Pulse Rate 01/01/21 0202 (!) 109     Resp 01/01/21 0202 20     Temp 01/01/21 0202 97.8 F (36.6 C)     Temp Source 01/01/21 0202 Oral     SpO2 01/01/21 0202 97 %     Weight --      Height --      Head Circumference --      Peak Flow --      Pain Score 01/01/21 0203 0     Pain Loc --      Pain Edu? --      Excl. in [place]? --    Vitals:   01/01/21 1300 01/01/21 1330  BP: 134/89 134/81  Pulse: 98 88  Resp: 20 19  Temp:    SpO2: 97% 96%   Physical Exam Vitals and nursing note reviewed.  Constitutional:      General: She is not in acute distress.    Appearance: She is well-developed.  HENT:     Head: Normocephalic and atraumatic.     Right Ear: External ear normal.     Left Ear: External ear normal.     Nose: Nose normal.     Mouth/Throat:     Mouth: Mucous membranes are moist.  Eyes:     Conjunctiva/sclera: Conjunctivae normal.  Cardiovascular:     Rate and Rhythm: Regular rhythm. Tachycardia present.     Heart sounds: No murmur heard. Pulmonary:      Effort: Pulmonary effort is normal. Tachypnea present. No respiratory distress.     Breath sounds: Decreased breath sounds present.  Abdominal:     General: There is distension.     Palpations: Abdomen is soft.     Tenderness: There is abdominal tenderness.  Musculoskeletal:        General: No swelling.     Cervical back: Neck supple.     Right lower leg: Edema  present.     Left lower leg: Edema present.  Skin:    General: Skin is warm and dry.     Capillary Refill: Capillary refill takes less than 2 seconds.  Neurological:     Mental Status: She is alert and oriented to person, place, and time.  Psychiatric:        Mood and Affect: Mood normal.     ____________________________________________   LABS (all labs ordered are listed, but only abnormal results are displayed)  Labs Reviewed  COMPREHENSIVE METABOLIC PANEL - Abnormal; Notable for the following components:      Result Value   Sodium 125 (*)    Potassium 2.9 (*)    Chloride 94 (*)    CO2 21 (*)    Glucose, Bld 144 (*)    Creatinine, Ser 1.13 (*)    Calcium 8.5 (*)    Albumin 3.0 (*)    GFR, Estimated 51 (*)    All other components within normal limits  CBC WITH DIFFERENTIAL/PLATELET - Abnormal; Notable for the following components:   WBC 11.0 (*)    RBC 3.19 (*)    Hemoglobin 10.8 (*)    HCT 28.8 (*)    MCHC 37.5 (*)    RDW 15.9 (*)    Platelets 556 (*)    Neutro Abs 8.7 (*)    Monocytes Absolute 1.3 (*)    All other components within normal limits  D-DIMER, QUANTITATIVE - Abnormal; Notable for the following components:   D-Dimer, Quant >20.00 (*)    All other components within normal limits  BRAIN NATRIURETIC PEPTIDE - Abnormal; Notable for the following components:   B Natriuretic Peptide 243.5 (*)    All other components within normal limits  URINALYSIS, COMPLETE (UACMP) WITH MICROSCOPIC - Abnormal; Notable for the following components:   Color, Urine YELLOW (*)    APPearance HAZY (*)    Glucose, UA  50 (*)    Hgb urine dipstick SMALL (*)    Ketones, ur 5 (*)    Protein, ur 100 (*)    Leukocytes,Ua SMALL (*)    Bacteria, UA RARE (*)    Non Squamous Epithelial PRESENT (*)    All other components within normal limits  FERRITIN - Abnormal; Notable for the following components:   Ferritin 354 (*)    All other components within normal limits  IRON AND TIBC - Abnormal; Notable for the following components:   TIBC 218 (*)    All other components within normal limits  OSMOLALITY - Abnormal; Notable for the following components:   Osmolality 271 (*)    All other components within normal limits  TROPONIN I (HIGH SENSITIVITY) - Abnormal; Notable for the following components:   Troponin I (High Sensitivity) 22 (*)    All other components within normal limits  TROPONIN I (HIGH SENSITIVITY) - Abnormal; Notable for the following components:   Troponin I (High Sensitivity) 19 (*)    All other components within normal limits  RESP PANEL BY RT-PCR (FLU A&B, COVID) ARPGX2  LIPASE, BLOOD  MAGNESIUM  SODIUM, URINE, RANDOM  OSMOLALITY, URINE  TSH  CEA  CA 125  CA 19-9 (SERIAL)   ____________________________________________  EKG  ECG remarkable sinus tachycardia with a ventricular rate of 110, no ST elevation in V2 without other clear evidence of acute ischemia or significant arrhythmia.  There is also fairly deep Q wave in V2.  Unremarkable intervals and normal axis. ____________________________________________  RADIOLOGY  ED MD interpretation:  Chest  x-ray remarkable for small bilateral pleural effusions without clear overt edema, pneumothorax, effusion, focal consolidation or other clear acute thoracic process.  KUB shows some distention of the colon.  CTA chest abdomen pelvis shows no evidence of PE or pneumonia but does show tense ascites with features concerning for underlying carcinomatosis.  There is some thickening of the left ovary possibly representing a primary tumor versus  metastatic deposit.  There is small bilateral pleural effusions and some atelectasis.  No other clear acute abdominal or pelvic process.  Official radiology report(s): DG Chest 2 View  Result Date: 01/01/2021 CLINICAL DATA:  Shortness of breath. EXAM: CHEST - 2 VIEW COMPARISON:  PA Lat 12/30/2020. FINDINGS: The cardiac size is normal. There is mild aortic uncoiling, small amount of calcification. There are small pleural effusions collecting posteriorly, which was seen previously. The lungs are expiratory with very limited visualization of the lower lung fields. The visualized aerated lungs are clear. Osteopenia. IMPRESSION: Limited expiratory study. The lower lung fields are poorly evaluated. Visualized aerated lungs are clear. There are small pleural effusions. Overall aeration seems unchanged. Electronically Signed   By: Telford Nab M.D.   On: 01/01/2021 02:50   CT Angio Chest PE W and/or Wo Contrast  Result Date: 01/01/2021 CLINICAL DATA:  Acute, nonlocalized abdominal pain. Shortness of breath for 1 week. EXAM: CT ANGIOGRAPHY CHEST CT ABDOMEN AND PELVIS WITH CONTRAST TECHNIQUE: Multidetector CT imaging of the chest was performed using the standard protocol during bolus administration of intravenous contrast. Multiplanar CT image reconstructions and MIPs were obtained to evaluate the vascular anatomy. Multidetector CT imaging of the abdomen and pelvis was performed using the standard protocol during bolus administration of intravenous contrast. CONTRAST:  185mL OMNIPAQUE IOHEXOL 350 MG/ML SOLN COMPARISON:  Abdominal CT 05/26/2015 FINDINGS: CTA CHEST FINDINGS Cardiovascular: Satisfactory opacification of the pulmonary arteries to the segmental level on second injection, first being severely degraded by bolus dispersion. No evidence of pulmonary embolism. Normal heart size. No pericardial effusion. Coronary atherosclerosis. Mediastinum/Nodes: No mass or adenopathy. Lungs/Pleura: Small pleural  effusions on the right more than left with atelectasis. Musculoskeletal: No acute or aggressive finding Review of the MIP images confirms the above findings. CT ABDOMEN and PELVIS FINDINGS Hepatobiliary: Compressed liver due to the size of ascites. Cholelithiasis without acute cholecystitis or ductal dilatation Pancreas: No acute finding.  No inflammation or mass seen Spleen: Negative Adrenals/Urinary Tract: Negative adrenals and kidneys. Largely collapsed bladder. Stomach/Bowel: No obstruction, inflammation, or visible mass Vascular/Lymphatic: Atheromatous calcification. Nodularity in the left groin which is likely tumor within the remnant inguinal canal. Reproductive: Asymmetric heterogeneous thickening of the left ovary measuring 3.3 cm. Other: Large volume ascites with tense abdomen. Omental caking especially below the transverse colon. Nodularity also seen in the pelvic peritoneal recesses. Anasarca. Musculoskeletal: No acute finding Review of the MIP images confirms the above findings. IMPRESSION: 1. 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 A primary is not certain, but there is notable asymmetric thickening and heterogeneity of the left ovary (primary versus metastatic deposits). 2. Low volume chest with small pleural effusions and mild atelectasis. Negative for pulmonary embolism. Electronically Signed   By: Jorje Guild M.D.   On: 01/01/2021 10:04   CT ABDOMEN PELVIS W CONTRAST  Result Date: 01/01/2021 CLINICAL DATA:  Acute, nonlocalized abdominal pain. Shortness of breath for 1 week. EXAM: CT ANGIOGRAPHY CHEST CT ABDOMEN AND PELVIS WITH CONTRAST TECHNIQUE: Multidetector CT imaging of the chest was performed using the standard protocol during bolus administration of intravenous  contrast. Multiplanar CT image reconstructions and MIPs were obtained to evaluate the vascular anatomy. Multidetector CT imaging of the abdomen and pelvis was performed using the standard protocol  during bolus administration of intravenous contrast. CONTRAST:  183mL OMNIPAQUE IOHEXOL 350 MG/ML SOLN COMPARISON:  Abdominal CT 05/26/2015 FINDINGS: CTA CHEST FINDINGS Cardiovascular: Satisfactory opacification of the pulmonary arteries to the segmental level on second injection, first being severely degraded by bolus dispersion. No evidence of pulmonary embolism. Normal heart size. No pericardial effusion. Coronary atherosclerosis. Mediastinum/Nodes: No mass or adenopathy. Lungs/Pleura: Small pleural effusions on the right more than left with atelectasis. Musculoskeletal: No acute or aggressive finding Review of the MIP images confirms the above findings. CT ABDOMEN and PELVIS FINDINGS Hepatobiliary: Compressed liver due to the size of ascites. Cholelithiasis without acute cholecystitis or ductal dilatation Pancreas: No acute finding.  No inflammation or mass seen Spleen: Negative Adrenals/Urinary Tract: Negative adrenals and kidneys. Largely collapsed bladder. Stomach/Bowel: No obstruction, inflammation, or visible mass Vascular/Lymphatic: Atheromatous calcification. Nodularity in the left groin which is likely tumor within the remnant inguinal canal. Reproductive: Asymmetric heterogeneous thickening of the left ovary measuring 3.3 cm. Other: Large volume ascites with tense abdomen. Omental caking especially below the transverse colon. Nodularity also seen in the pelvic peritoneal recesses. Anasarca. Musculoskeletal: No acute finding Review of the MIP images confirms the above findings. IMPRESSION: 1. 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 A primary is not certain, but there is notable asymmetric thickening and heterogeneity of the left ovary (primary versus metastatic deposits). 2. Low volume chest with small pleural effusions and mild atelectasis. Negative for pulmonary embolism. Electronically Signed   By: Jorje Guild M.D.   On: 01/01/2021 10:04   DG Abd Portable 2  Views  Result Date: 01/01/2021 CLINICAL DATA:  Abdominal distension EXAM: PORTABLE ABDOMEN - 2 VIEW COMPARISON:  05/26/2015 FINDINGS: Gas and stool throughout the colon with relative sparing of the descending colon, but no convincing obstruction. No concerning mass effect or calcification. Lumbar spine degeneration and mild scoliosis. IMPRESSION: Moderate distension of the colon by gas and stool Electronically Signed   By: Jorje Guild M.D.   On: 01/01/2021 04:03    ____________________________________________   PROCEDURES  Procedure(s) performed (including Critical Care):  .1-3 Lead EKG Interpretation Performed by: Lucrezia Starch, MD Authorized by: Lucrezia Starch, MD     Interpretation: non-specific     ECG rate assessment: normal     Rhythm: sinus rhythm     Ectopy: none     Conduction: normal     ____________________________________________   INITIAL IMPRESSION / ASSESSMENT AND PLAN / ED COURSE      Patient presents with above-noted constellation of findings.  History and exam as above.    Differential considerations include PE, pneumonia, CHF, acute liver failure, pleural pain spondylosis, nephrosis versus other etiologies of fairly acute intra-abdominal ascites  ECG remarkable sinus tachycardia with a ventricular rate of 110, no ST elevation in V2 without other clear evidence of acute ischemia or significant arrhythmia.  There is also fairly deep Q wave in V2.  Unremarkable intervals and normal axis.  Troponins 22 and 19 are not suggestive of ACS and more suggestive of some very mild demand ischemia in the setting of gross volume overload.  Lipase not consistent with acute pancreatitis.  CMP remarkable for sodium of 125, potassium 2.9 without other significant electrolyte or metabolic derangements.  CBC shows WBC count of 11, hemoglobin of 10.8 and platelets of 556.  D-dimer is  greater than 20.  BNP is elevated to 243.  Magnesium 1.8.  UA has some small leukocyte  esterase and 21-50 WBCs with rare bacteria.  Chest x-ray remarkable for small bilateral pleural effusions without clear overt edema, pneumothorax, effusion, focal consolidation or other clear acute thoracic process.  KUB shows some distention of the colon.  CTA chest abdomen pelvis shows no evidence of PE or pneumonia but does show tense ascites with features concerning for underlying carcinomatosis.  There is some thickening of the left ovary possibly representing a primary tumor versus metastatic deposit.  There is small bilateral pleural effusions and some atelectasis.  No other clear acute abdominal or pelvic process.  I am concerned for undiagnosed metastatic cancer as primary etiology for patient's symptoms with her ascites likely impressing patient's diaphragm as well as small hydrothorax with small bilateral pleural effusions.  Discussed with patient and family with concerns for cancer.  I will admit to medicine service for further evaluation and management.      ____________________________________________   FINAL CLINICAL IMPRESSION(S) / ED DIAGNOSES  Final diagnoses:  Other ascites  SOB (shortness of breath)  Hypokalemia  Pleural effusion  Troponin I above reference range  Elevated brain natriuretic peptide (BNP) level    Medications  iohexol (OMNIPAQUE) 350 MG/ML injection 100 mL (100 mLs Intravenous Contrast Given 01/01/21 0901)  potassium chloride SA (KLOR-CON M) CR tablet 40 mEq (40 mEq Oral Given 01/01/21 0931)     ED Discharge Orders     None        Note:  This document was prepared using Dragon voice recognition software and may include unintentional dictation errors.    Lucrezia Starch, MD 01/01/21 [phone]

## 2021-01-01 NOTE — ED Notes (Signed)
Pt presents to ER via EMS with CP for 6hrs abdominal distention for the past week, diarrhea for a week CP 3/10 N/V Had a test to rule out CHF and was negative per EMS 91%RA after 2LNC per EMS 96% Bp 176/98 Bp 130/90 Hr 114 Cbg 220 TOOK 324MG  ASPIRIN PRIOR TO ARRIVAL

## 2021-01-01 NOTE — Assessment & Plan Note (Addendum)
This was an unexpected finding and seems to be the likely cause of ascites.   Dr. Janese Banks will follow up cytology, CEA and CA125 if these are not resulted by the time of discharge.  Dr. Janese Banks will arrange [place] follow up  - Follow cytology - Follow CEA, CA125, CA19-9

## 2021-01-01 NOTE — Procedures (Signed)
PROCEDURE SUMMARY:  Successful US guided paracentesis from right abdomen.  Yielded 12 L of amber-colored fluid.  No immediate complications.  Pt tolerated well.   Specimen sent for labs.  EBL < 2 mL  Theresa Duty, NP 01/01/2021 3:15 PM

## 2021-01-01 NOTE — Assessment & Plan Note (Signed)
Continue Seroquel. 

## 2021-01-01 NOTE — H&P (Signed)
History and Physical    Patient: Kathy Morton NWG:[id number] DOB: 1945-06-22 DOA: 01/01/2021 DOS: the patient was seen and examined on 01/01/2021 PCP: Leonides Sake, MD  Patient coming from: Home  Chief Complaint: Abdominal swelling, SOB  HPI:   Kathy Morton is a 75 y.o. F with hx HTN, DVT (40 years ago) no longer on [facility], and anxiety who presented with 1-2 weeks progressive abdominal distension/swelling, weight gain, leg swelling, and now SOB with chest discomfort.  In the ER, she was swollen.  CT showed peritoneal carcinomatosis, unclear primary, tense ascites.  CTA chest ruled out PE.  Na 125, K 2.9. Dimer elevated.       Review of Systems: Review of Systems  Constitutional:  Negative for chills and fever.       Ascites  Respiratory:  Positive for shortness of breath. Negative for cough, hemoptysis and wheezing.   Cardiovascular:  Positive for leg swelling. Negative for orthopnea and PND.  Gastrointestinal:  Positive for diarrhea.  Genitourinary: Negative.   All other systems reviewed and are negative.    Past Medical History:  Diagnosis Date   Anxiety    Depression    DVT of leg (deep venous thrombosis) (HCC)    Dysuria    History of blood clots    Hypertension    Incontinence    Migraine    Recurrent UTI    Past Surgical History:  Procedure Laterality Date   BREAST BIOPSY Left 01/06/13   benign finding of sclerosing adenosis   COLONOSCOPY WITH PROPOFOL N/A 11/29/2015   Procedure: COLONOSCOPY WITH PROPOFOL;  Surgeon: Jonathon Bellows, MD;  Location: [facility] ENDOSCOPY;  Service: Endoscopy;  Laterality: N/A;   KNEE SURGERY Left 2010   torn meniscus   TONSILECTOMY, ADENOIDECTOMY, BILATERAL MYRINGOTOMY AND TUBES     Social History:  reports that she has never smoked. She has never used smokeless tobacco. She reports that she does not drink alcohol and does not use drugs.  No Known Allergies  Family History  Problem Relation Age of Onset   Bladder Cancer Mother     Heart attack Father    Breast cancer Maternal Grandmother 80   Kidney disease Neg Hx     Prior to Admission medications   Medication Sig Start Date End Date Taking? Authorizing Provider  atorvastatin (LIPITOR) 20 MG tablet TAKE 1 TABLET BY MOUTH EVERY DAY 05/20/20  Yes Baity, Coralie Keens, NP  furosemide (LASIX) 40 MG tablet Take 40 mg by mouth.   Yes [provider]  lisinopril (ZESTRIL) 10 MG tablet TAKE 1 TABLET BY MOUTH EVERY DAY 05/20/20  Yes Baity, Coralie Keens, NP  QUEtiapine (SEROQUEL) 100 MG tablet Take 1 tablet (100 mg total) by mouth at bedtime. 02/11/20  Yes Karamalegos, Devonne Doughty, DO  SUMAtriptan (IMITREX) 100 MG tablet May repeat in 2 hours if headache persists or recurs. 06/17/19  Yes Malfi, Lupita Raider, FNP  clonazePAM (KLONOPIN) 1 MG tablet Take 1 tablet (1 mg total) by mouth 2 (two) times daily as needed for anxiety. Patient not taking: Reported on 01/01/2021 02/13/20   Olin Hauser, DO  polyethylene glycol powder [facility]) 17 GM/SCOOP powder Use as directed 02/15/09   [provider]    Physical Exam: Vitals:   01/01/21 1000 01/01/21 1030 01/01/21 1130 01/01/21 1200  BP: (!) 144/88 (!) 142/97 130/83 (!) 151/90  Pulse: (!) 134 93 94 87  Resp: (!) 21 (!) 22 20 20   Temp:      TempSrc:  SpO2: 90% 98% 93% 95%   General appearance: Elderly adult female, lying in bed, no acute distress, interactive     HEENT: Anicteric, conjunctival pink, lids and lashes normal.  No nasal deformity, discharge, or epistaxis.  Dentition in good repair, lips normal, oropharynx moist, no oral lesions, hearing normal Skin: No stigmata of chronic liver disease, no jaundice Cardiac: Tachycardic, regular, no murmurs, normal S1-S2, 2+ pitting edema above the knee, JVP not visible Respiratory: Respiratory effort shallow due to ascites, no rales or wheezes, no dyspnea. Abdomen: Marked ascites, no stigmata of chronic liver disease.  No tenderness palpation. MSK:  Moderately reduced subcutaneous muscle mass and fat diffusely. Neuro: Alert, extraocular movements intact, moves all extremities with normal strength and coordination, speech fluent Psych: Attention normal, affect blunted, judgment insight appear normal.   Data Reviewed: Laboratory studies are notable for sodium 125, potassium 2.9, complete blood count notable for mild anemia.  Troponin slightly elevated.  Dimer elevated.  BNP elevated.CT angiogram and abdomen and pelvis reviewed          Assessment/Plan * Ascites- (present on admission) Given the periteoneal masses seen on CT, this is more likely malignant than cirrhotic or CHF.  - Obtain paracentesis, therapeutic and diagnostic - Obtain ascites albumin, cytology - Hold recently started furosemide today  - Obtain echo to eval EF  Peritoneal carcinomatosis ([place])- (present on admission) This was an unexpected finding.   Discussed with Dr. Janese Banks.  Start with cytology and CA125. - Follow cytology - Follow CEA, CA125, CA19-9  Leg swelling- (present on admission) Given pelvic masses and cancer and prior DVT and elevated dimer, need to rule out DVT. - Obtain US LE bilateral - Obtain echo  Hyponatremia- (present on admission) Hypervolemic. - Check urine lytes - Volume removal with paracentesis, trend Na - Fluid restriction  Hypokalemia- (present on admission) Mag nl - Supplement K  Anemia- (present on admission) MCV low normal, RDW elevated - Check iron stores  Hypertension- (present on admission) -Hold lisinopril today  Hyperlipidemia- (present on admission) -Continue atorvastatin  Class 1 obesity due to excess calories with body mass index (BMI) of 33.0 to 33.9 in adult BMI 31  Depression- (present on admission) -Continue Seroquel    Advance Care Planning: Full code  Consults: Interventional radiology  Family Communication: Son at the bedside  Severity of Illness: The appropriate patient status for  this patient is INPATIENT. Inpatient status is judged to be reasonable and necessary in order to provide the required intensity of service to ensure the patient's safety. The patient's presenting symptoms, physical exam findings, and initial radiographic and laboratory data in the context of their chronic comorbidities is felt to place them at high risk for further clinical deterioration. Furthermore, it is not anticipated that the patient will be medically stable for discharge from the hospital within 2 midnights of admission.   * I certify that at the point of admission it is my clinical judgment that the patient will require inpatient hospital care spanning beyond 2 midnights from the point of admission due to high intensity of service, high risk for further deterioration and high frequency of surveillance required.*  Author: Edwin Dada 01/01/2021 12:27 PM  For on call review [email or web address].

## 2021-01-01 NOTE — Assessment & Plan Note (Addendum)
-   Start B12 and folate, prescribe at discharge

## 2021-01-01 NOTE — ED Notes (Signed)
Pt tx for paracentesis.

## 2021-01-01 NOTE — Assessment & Plan Note (Addendum)
BP normal off home lisinopril -Hold lisinopril

## 2021-01-01 NOTE — Assessment & Plan Note (Addendum)
BMI 31 °

## 2021-01-01 NOTE — Assessment & Plan Note (Addendum)
[place] LE ruled out DVT.

## 2021-01-01 NOTE — Consult Note (Signed)
Hematology/Oncology Consult note [facility] Telephone:([phone] [phone]  Patient Care Team: Leonides Sake, MD as PCP - General (Family Medicine)   Name of the patient: Kathy Morton  [id number]  Oct 14, 1945    Reason for consult: Concern for ovarian cancer with malignant ascites   Requesting physician: Dr. Loleta Books  Date of visit: 01/01/2021    History of presenting illness- Patient is a 75 year old female with a past medical history significant for DVT hypertension anxiety depression who presented to the ER with symptoms of shortness of breath leg swelling and abdominal distention.  She had CT angio chest which did not show any evidence of PE.  Low volume chest with small pleural effusions.  CT abdomen and pelvis with contrast showed tense ascites with features of underlying peritoneal carcinomatosis.  Asymmetric thickening and heterogeneity of the left ovary raising the concern for potential ovarian cancer.  Oncology consulted for further management  At baseline patient lives alone and is independent of her ADL's. Son lives 45 min away. She reports worsening abdominal distension, sob over last 2 weeks  ECOG PS- 2  Pain scale- 0   Review of systems- Review of Systems  Constitutional:  Positive for malaise/fatigue.  Respiratory:  Positive for shortness of breath.   Cardiovascular:  Positive for leg swelling.  Gastrointestinal:        Abdominal distension   No Known Allergies  Patient Active Problem List   Diagnosis Date Noted   Ascites 01/01/2021   Class 1 obesity due to excess calories without serious comorbidity with body mass index (BMI) of 31.0 to 31.9 in adult 12/02/2019   Anxiety 06/17/2019   Depression 08/19/2018   Binge eating disorder 06/01/2017   Cold sore 10/25/2016   Osteopenia of femoral neck, bilateral 04/26/2016   Benign paroxysmal positional vertigo 12/22/2015   Positive occult stool blood test    Internal  hemorrhoids    Colon cancer screening 11/12/2015   Vaginal atrophy 05/22/2015   Hyperlipidemia 12/14/2014   Hypertension 12/14/2014   Chronic constipation 12/14/2014   Insomnia 12/14/2014   PTSD (post-traumatic stress disorder) 11/09/2014   Class 1 obesity due to excess calories with body mass index (BMI) of 33.0 to 33.9 in adult 11/09/2014   Migraines 11/09/2014   History of DVT (deep vein thrombosis) 11/09/2014     Past Medical History:  Diagnosis Date   Anxiety    Depression    DVT of leg (deep venous thrombosis) (HCC)    Dysuria    History of blood clots    Hypertension    Incontinence    Migraine    Recurrent UTI      Past Surgical History:  Procedure Laterality Date   BREAST BIOPSY Left 01/06/13   benign finding of sclerosing adenosis   COLONOSCOPY WITH PROPOFOL N/A 11/29/2015   Procedure: COLONOSCOPY WITH PROPOFOL;  Surgeon: Jonathon Bellows, MD;  Location: [facility] ENDOSCOPY;  Service: Endoscopy;  Laterality: N/A;   KNEE SURGERY Left 2010   torn meniscus   TONSILECTOMY, ADENOIDECTOMY, BILATERAL MYRINGOTOMY AND TUBES      Social History   Socioeconomic History   Marital status: Divorced    Spouse name: Not on file   Number of children: Not on file   Years of education: Not on file   Highest education level: Some college, no degree  Occupational History   Occupation: retired   Tobacco Use   Smoking status: Never   Smokeless tobacco: Never  Vaping Use   Vaping Use:  Never used  Substance and Sexual Activity   Alcohol use: No   Drug use: No   Sexual activity: Not on file  Other Topics Concern   Not on file  Social History Narrative   Not on file   Social Determinants of Health   Financial Resource Strain: Not on file  Food Insecurity: Not on file  Transportation Needs: Not on file  Physical Activity: Not on file  Stress: Not on file  Social Connections: Not on file  Intimate Partner Violence: Not on file     Family History  Problem Relation Age of  Onset   Bladder Cancer Mother    Heart attack Father    Breast cancer Maternal Grandmother 80   Kidney disease Neg Hx     No current facility-administered medications for this encounter.  Current Outpatient Medications:    atorvastatin (LIPITOR) 20 MG tablet, TAKE 1 TABLET BY MOUTH EVERY DAY, Disp: 90 tablet, Rfl: 0   furosemide (LASIX) 40 MG tablet, Take 40 mg by mouth., Disp: , Rfl:    lisinopril (ZESTRIL) 10 MG tablet, TAKE 1 TABLET BY MOUTH EVERY DAY, Disp: 90 tablet, Rfl: 0   QUEtiapine (SEROQUEL) 100 MG tablet, Take 1 tablet (100 mg total) by mouth at bedtime., Disp: 90 tablet, Rfl: 1   SUMAtriptan (IMITREX) 100 MG tablet, May repeat in 2 hours if headache persists or recurs., Disp: 9 tablet, Rfl: 5   clonazePAM (KLONOPIN) 1 MG tablet, Take 1 tablet (1 mg total) by mouth 2 (two) times daily as needed for anxiety. (Patient not taking: Reported on 01/01/2021), Disp: 60 tablet, Rfl: 2   polyethylene glycol powder (GLYCOLAX/MIRALAX) 17 GM/SCOOP powder, Use as directed, Disp: , Rfl:    Physical exam:  Vitals:   01/01/21 0559 01/01/21 0839 01/01/21 1000 01/01/21 1030  BP: 131/82 131/72 (!) 144/88 (!) 142/97  Pulse: (!) 102 96 (!) 134 93  Resp: (!) 22 (!) 22 (!) 21 (!) 22  Temp:  97.8 F (36.6 C)    TempSrc:  Oral    SpO2: 99% 99% 90% 98%   Physical Exam Constitutional:      General: She is not in acute distress. Cardiovascular:     Rate and Rhythm: Normal rate and regular rhythm.     Heart sounds: Normal heart sounds.  Pulmonary:     Effort: Pulmonary effort is normal.     Breath sounds: Normal breath sounds.  Abdominal:     Comments: Tense, distended. Ascites +  Musculoskeletal:     Right lower leg: Edema present.     Left lower leg: Edema present.  Skin:    General: Skin is warm and dry.  Neurological:     Mental Status: She is alert and oriented to person, place, and time.       CMP Latest Ref Rng & Units 01/01/2021  Glucose 70 - 99 mg/dL 144(H)  BUN 8 - 23  mg/dL 23  Creatinine 0.44 - 1.00 mg/dL 1.13(H)  Sodium 135 - 145 mmol/L 125(L)  Potassium 3.5 - 5.1 mmol/L 2.9(L)  Chloride 98 - 111 mmol/L 94(L)  CO2 22 - 32 mmol/L 21(L)  Calcium 8.9 - 10.3 mg/dL 8.5(L)  Total Protein 6.5 - 8.1 g/dL 6.6  Total Bilirubin 0.3 - 1.2 mg/dL 0.5  Alkaline Phos 38 - 126 U/L 64  AST 15 - 41 U/L 21  ALT 0 - 44 U/L 10   CBC Latest Ref Rng & Units 01/01/2021  WBC 4.0 - 10.5 K/uL 11.0(H)  Hemoglobin 12.0 - 15.0 g/dL 10.8(L)  Hematocrit 36.0 - 46.0 % 28.8(L)  Platelets 150 - 400 K/uL 556(H)    @IMAGES @  DG Chest 2 View  Result Date: 01/01/2021 CLINICAL DATA:  Shortness of breath. EXAM: CHEST - 2 VIEW COMPARISON:  PA Lat 12/30/2020. FINDINGS: The cardiac size is normal. There is mild aortic uncoiling, small amount of calcification. There are small pleural effusions collecting posteriorly, which was seen previously. The lungs are expiratory with very limited visualization of the lower lung fields. The visualized aerated lungs are clear. Osteopenia. IMPRESSION: Limited expiratory study. The lower lung fields are poorly evaluated. Visualized aerated lungs are clear. There are small pleural effusions. Overall aeration seems unchanged. Electronically Signed   By: Telford Nab M.D.   On: 01/01/2021 02:50   DG Chest 2 View  Result Date: 12/30/2020 CLINICAL DATA:  Shortness of breath with exertion for the past 2 weeks. EXAM: CHEST - 2 VIEW COMPARISON:  None. FINDINGS: The heart size and mediastinal contours are within normal limits. Normal pulmonary vascularity. Low lung volumes. Trace bilateral pleural effusions. No consolidation or pneumothorax. No acute osseous abnormality. IMPRESSION: 1. Low lung volumes with trace bilateral pleural effusions. Electronically Signed   By: Titus Dubin M.D.   On: 12/30/2020 12:42   CT Angio Chest PE W and/or Wo Contrast  Result Date: 01/01/2021 CLINICAL DATA:  Acute, nonlocalized abdominal pain. Shortness of breath for 1 week.  EXAM: CT ANGIOGRAPHY CHEST CT ABDOMEN AND PELVIS WITH CONTRAST TECHNIQUE: Multidetector CT imaging of the chest was performed using the standard protocol during bolus administration of intravenous contrast. Multiplanar CT image reconstructions and MIPs were obtained to evaluate the vascular anatomy. Multidetector CT imaging of the abdomen and pelvis was performed using the standard protocol during bolus administration of intravenous contrast. CONTRAST:  11mL OMNIPAQUE IOHEXOL 350 MG/ML SOLN COMPARISON:  Abdominal CT 05/26/2015 FINDINGS: CTA CHEST FINDINGS Cardiovascular: Satisfactory opacification of the pulmonary arteries to the segmental level on second injection, first being severely degraded by bolus dispersion. No evidence of pulmonary embolism. Normal heart size. No pericardial effusion. Coronary atherosclerosis. Mediastinum/Nodes: No mass or adenopathy. Lungs/Pleura: Small pleural effusions on the right more than left with atelectasis. Musculoskeletal: No acute or aggressive finding Review of the MIP images confirms the above findings. CT ABDOMEN and PELVIS FINDINGS Hepatobiliary: Compressed liver due to the size of ascites. Cholelithiasis without acute cholecystitis or ductal dilatation Pancreas: No acute finding.  No inflammation or mass seen Spleen: Negative Adrenals/Urinary Tract: Negative adrenals and kidneys. Largely collapsed bladder. Stomach/Bowel: No obstruction, inflammation, or visible mass Vascular/Lymphatic: Atheromatous calcification. Nodularity in the left groin which is likely tumor within the remnant inguinal canal. Reproductive: Asymmetric heterogeneous thickening of the left ovary measuring 3.3 cm. Other: Large volume ascites with tense abdomen. Omental caking especially below the transverse colon. Nodularity also seen in the pelvic peritoneal recesses. Anasarca. Musculoskeletal: No acute finding Review of the MIP images confirms the above findings. IMPRESSION: 1. Tense ascites with  features of underlying peritoneal carcinomatosis. A primary is not certain, but there is notable asymmetric thickening and heterogeneity of the left ovary (primary versus metastatic deposits). 2. Low volume chest with small pleural effusions and mild atelectasis. Negative for pulmonary embolism. Electronically Signed   By: Jorje Guild M.D.   On: 01/01/2021 10:04   CT ABDOMEN PELVIS W CONTRAST  Result Date: 01/01/2021 CLINICAL DATA:  Acute, nonlocalized abdominal pain. Shortness of breath for 1 week. EXAM: CT ANGIOGRAPHY CHEST CT ABDOMEN AND PELVIS WITH CONTRAST TECHNIQUE:  Multidetector CT imaging of the chest was performed using the standard protocol during bolus administration of intravenous contrast. Multiplanar CT image reconstructions and MIPs were obtained to evaluate the vascular anatomy. Multidetector CT imaging of the abdomen and pelvis was performed using the standard protocol during bolus administration of intravenous contrast. CONTRAST:  166mL OMNIPAQUE IOHEXOL 350 MG/ML SOLN COMPARISON:  Abdominal CT 05/26/2015 FINDINGS: CTA CHEST FINDINGS Cardiovascular: Satisfactory opacification of the pulmonary arteries to the segmental level on second injection, first being severely degraded by bolus dispersion. No evidence of pulmonary embolism. Normal heart size. No pericardial effusion. Coronary atherosclerosis. Mediastinum/Nodes: No mass or adenopathy. Lungs/Pleura: Small pleural effusions on the right more than left with atelectasis. Musculoskeletal: No acute or aggressive finding Review of the MIP images confirms the above findings. CT ABDOMEN and PELVIS FINDINGS Hepatobiliary: Compressed liver due to the size of ascites. Cholelithiasis without acute cholecystitis or ductal dilatation Pancreas: No acute finding.  No inflammation or mass seen Spleen: Negative Adrenals/Urinary Tract: Negative adrenals and kidneys. Largely collapsed bladder. Stomach/Bowel: No obstruction, inflammation, or visible mass  Vascular/Lymphatic: Atheromatous calcification. Nodularity in the left groin which is likely tumor within the remnant inguinal canal. Reproductive: Asymmetric heterogeneous thickening of the left ovary measuring 3.3 cm. Other: Large volume ascites with tense abdomen. Omental caking especially below the transverse colon. Nodularity also seen in the pelvic peritoneal recesses. Anasarca. Musculoskeletal: No acute finding Review of the MIP images confirms the above findings. IMPRESSION: 1. 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 A primary is not certain, but there is notable asymmetric thickening and heterogeneity of the left ovary (primary versus metastatic deposits). 2. Low volume chest with small pleural effusions and mild atelectasis. Negative for pulmonary embolism. Electronically Signed   By: Jorje Guild M.D.   On: 01/01/2021 10:04   DG Abd Portable 2 Views  Result Date: 01/01/2021 CLINICAL DATA:  Abdominal distension EXAM: PORTABLE ABDOMEN - 2 VIEW COMPARISON:  05/26/2015 FINDINGS: Gas and stool throughout the colon with relative sparing of the descending colon, but no convincing obstruction. No concerning mass effect or calcification. Lumbar spine degeneration and mild scoliosis. IMPRESSION: Moderate distension of the colon by gas and stool Electronically Signed   By: Jorje Guild M.D.   On: 01/01/2021 04:03    Assessment and plan- Patient is a 75 y.o. 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  Recommend getting therapeutic paracentesis at this time and sending fluid for cytology.  Check Ca1 25 and CEA.  I will  reach out to GYN oncology on 01/06/2020 if cytology results are back by then to determine further management of potential metastatic ovarian cancer.  This can be done as an outpatient as well  Hyponatremia/hypokalemia and AKI.  Management per primary team  Normocytic anemia: We will order anemia work-up in a.m.  B/L LE edema- likely due to intraabdominal compression causing lymphatic/ venous obstruction  Patient and family comprehend my plan well   Visit Diagnosis 1. Other ascites   2. SOB (shortness of breath)   3. Ascites     Dr. Randa Evens, MD, MPH [facility] [phone] 01/01/2021

## 2021-01-01 NOTE — Assessment & Plan Note (Addendum)
Mag nl - Continue K supplement

## 2021-01-01 NOTE — ED Triage Notes (Signed)
Pt was brought from home via EMS. Pt states she started feeling short of breath on Wednesday. Was seen at her PCP and received an xray. Pt states that her breathing has gotten worse since Wednesday. This pt is on 2L of xygen with oxygen saturations of 97%.

## 2021-01-01 NOTE — Assessment & Plan Note (Addendum)
Hypervolemic.  Improved after paracentesis.  Urine sodium low.   - Continue fluid restriction

## 2021-01-01 NOTE — ED Triage Notes (Signed)
Room air saturation is 98% at this time.

## 2021-01-02 ENCOUNTER — Inpatient Hospital Stay: Payer: Medicare PPO

## 2021-01-02 ENCOUNTER — Inpatient Hospital Stay (HOSPITAL_COMMUNITY)
Admit: 2021-01-02 | Discharge: 2021-01-02 | Disposition: A | Discharge status: Home / Self Care | Payer: Medicare PPO | Attending: Family Medicine | Admitting: Family Medicine

## 2021-01-02 DIAGNOSIS — C786 Secondary malignant neoplasm of retroperitoneum and peritoneum: Principal | ICD-10-CM

## 2021-01-02 DIAGNOSIS — R0609 Other forms of dyspnea: Secondary | ICD-10-CM

## 2021-01-02 DIAGNOSIS — F329 Major depressive disorder, single episode, unspecified: Secondary | ICD-10-CM

## 2021-01-02 DIAGNOSIS — D529 Folate deficiency anemia, unspecified: Secondary | ICD-10-CM

## 2021-01-02 LAB — CBC
HCT: 29.4 % — ABNORMAL LOW (ref 36.0–46.0)
Hemoglobin: 9.4 g/dL — ABNORMAL LOW (ref 12.0–15.0)
MCH: 27.4 pg (ref 26.0–34.0)
MCHC: 32 g/dL (ref 30.0–36.0)
MCV: 85.7 fL (ref 80.0–100.0)
Platelets: 471 10*3/uL — ABNORMAL HIGH (ref 150–400)
RBC: 3.43 MIL/uL — ABNORMAL LOW (ref 3.87–5.11)
RDW: 15.3 % (ref 11.5–15.5)
WBC: 7.8 10*3/uL (ref 4.0–10.5)
nRBC: 0 % (ref 0.0–0.2)

## 2021-01-02 LAB — BASIC METABOLIC PANEL
Anion gap: 9 (ref 5–15)
BUN: 16 mg/dL (ref 8–23)
CO2: 23 mmol/L (ref 22–32)
Calcium: 8.3 mg/dL — ABNORMAL LOW (ref 8.9–10.3)
Chloride: 99 mmol/L (ref 98–111)
Creatinine, Ser: 0.81 mg/dL (ref 0.44–1.00)
GFR, Estimated: >60 mL/min (ref >60)
Glucose, Bld: 112 mg/dL — ABNORMAL HIGH (ref 70–99)
Potassium: 3.4 mmol/L — ABNORMAL LOW (ref 3.5–5.1)
Sodium: 131 mmol/L — ABNORMAL LOW (ref 135–145)

## 2021-01-02 LAB — VITAMIN B12: Vitamin B-12: 131 pg/mL — ABNORMAL LOW (ref 180–914)

## 2021-01-02 LAB — FOLATE: Folate: 5.1 ng/mL — ABNORMAL LOW (ref >5.9)

## 2021-01-02 MED ORDER — FUROSEMIDE 10 MG/ML IJ SOLN
40.0000 mg | Freq: Every day | INTRAMUSCULAR | Status: DC
  Administered 2021-01-02 – 2021-01-03 (×2): 40 mg via INTRAVENOUS
  Filled 2021-01-02 (×2): qty 4

## 2021-01-02 MED ORDER — VITAMIN B-12 100 MCG PO TABS
100.0000 ug | ORAL_TABLET | Freq: Every day | ORAL | Status: DC
  Administered 2021-01-02 – 2021-01-03 (×2): 100 ug via ORAL
  Filled 2021-01-02 (×2): qty 1

## 2021-01-02 MED ORDER — POTASSIUM CHLORIDE CRYS ER 20 MEQ PO TBCR
40.0000 meq | EXTENDED_RELEASE_TABLET | Freq: Two times a day (BID) | ORAL | Status: AC
  Administered 2021-01-02 (×2): 40 meq via ORAL
  Filled 2021-01-02 (×2): qty 2

## 2021-01-02 MED ORDER — FOLIC ACID 1 MG PO TABS
1.0000 mg | ORAL_TABLET | Freq: Every day | ORAL | Status: DC
  Administered 2021-01-02 – 2021-01-03 (×2): 1 mg via ORAL
  Filled 2021-01-02 (×2): qty 1

## 2021-01-02 MED ORDER — POTASSIUM CHLORIDE CRYS ER 20 MEQ PO TBCR
20.0000 meq | EXTENDED_RELEASE_TABLET | Freq: Once | ORAL | Status: AC
  Administered 2021-01-02: 20 meq via ORAL
  Filled 2021-01-02: qty 1

## 2021-01-02 NOTE — Progress Notes (Signed)
°  Progress Note   Patient: Kathy Morton:[id number] DOB: [birth year] DOA: 01/01/2021     1 DOS: the patient was seen and examined on 01/02/2021       Brief hospital course: Kathy Morton is a 76 y.o. F with hx HTN, DVT (40 years ago) no longer on [facility], and anxiety who presented with 1-2 weeks progressive abdominal distension/swelling, weight gain, leg swelling, and now SOB with chest discomfort.  In the ER, she was swollen.  CT showed peritoneal carcinomatosis, unclear primary, tense ascites.  CTA chest ruled out PE.  Na 125, K 2.9. Dimer elevated.   12/31: Admitted and had paracentesis of 12L fluid, albumin given, cytology sent 1/1: Given IV Lasix       Assessment and Plan * Ascites- (present on admission) Given the periteoneal masses seen on CT, low SAAG, lack of clinical signs of cirrhosis this is more likely malignant; also low suspicion for CHF.  Paracentesis on 12/31 of 12L.  Still some ascites, and marked lower extremity swelling.  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   Dr. Janese Banks will follow up cytology, CEA and CA125 if these are not resulted by the time of discharge.  Dr. Janese Banks will arrange [place] follow up  - Follow cytology - Follow CEA, CA125, CA19-9  Leg swelling- (present on admission) [place] LE ruled out DVT.  Hyponatremia- (present on admission) Hypervolemic.  Improved after paracentesis.  Urine sodium low.   - Continue fluid restriction  Hypokalemia- (present on admission) Mag nl - Continue K supplement  Anemia, folate and B12 deficiency- (present on admission) - Start B12 and folate, prescribe at discharge  Hypertension- (present on admission) BP normal off home lisinopril -Hold lisinopril  Hyperlipidemia- (present on admission) -Continue atorvastatin  Class 1 obesity due to excess calories  with body mass index (BMI) of 33.0 to 33.9 in adult BMI 31  Depression- (present on admission) -Continue Seroquel     Subjective: Feeling somewhat better, still swollen.  No dyspnea.  No orthopnea.  Overall tired.  Objective Vital signs were reviewed and unremarkable. General appearance: Elderly adult female, lying in bed, appears tired     HEENT:    Skin:  Cardiac: RRR, no murmurs, and 2+ lower extremity edema to the knees Respiratory: Respiratory rate and rhythm, lungs clear without rales or wheezes Abdomen: Some ascites, no longer tense and distended.  No tenderness to palpation. MSK:  Neuro:    Psych:     Data Reviewed: Complete blood count and basic metabolic panel reviewed and summarized above B12 level and folate level reviewed, both are low and will start supplementation.  Family Communication: Sons at the bedside  Disposition: Status is: Inpatient  Remains inpatient appropriate because: She requires ongoing IV Lasix.  I suspect that her swelling will be somewhat better tomorrow, her echo will be normal and she will be stable for discharge tomorrow.  Echo could also be done after discharge.    If that is the case, oncology have arranged for follow-up in their office, and will reach out this week to schedule the appointment.             Author: Edwin Dada 01/02/2021 2:44 PM  For on call review [email or web address].

## 2021-01-02 NOTE — Progress Notes (Signed)
*  PRELIMINARY RESULTS* Echocardiogram 2D Echocardiogram has been performed.  Kathy Morton 01/02/2021, 5:35 PM

## 2021-01-03 DIAGNOSIS — R180 Malignant ascites: Secondary | ICD-10-CM

## 2021-01-03 LAB — BASIC METABOLIC PANEL
Anion gap: 7 (ref 5–15)
BUN: 16 mg/dL (ref 8–23)
CO2: 24 mmol/L (ref 22–32)
Calcium: 7.9 mg/dL — ABNORMAL LOW (ref 8.9–10.3)
Chloride: 101 mmol/L (ref 98–111)
Creatinine, Ser: 0.64 mg/dL (ref 0.44–1.00)
GFR, Estimated: >60 mL/min (ref >60)
Glucose, Bld: 115 mg/dL — ABNORMAL HIGH (ref 70–99)
Potassium: 3.9 mmol/L (ref 3.5–5.1)
Sodium: 132 mmol/L — ABNORMAL LOW (ref 135–145)

## 2021-01-03 LAB — ECHOCARDIOGRAM COMPLETE
AR max vel: 1.41 cm2
AV Peak grad: 11.8 mmHg
Ao pk vel: 1.72 m/s
Area-P 1/2: 3.4 cm2
Calc EF: 55.1 %
Height: 63 in
S' Lateral: 2.9 cm
Single Plane A2C EF: 47.4 %
Single Plane A4C EF: 62.1 %
Weight: 2835 [oz_av]

## 2021-01-03 LAB — CEA: CEA: 1.4 ng/mL (ref 0.0–4.7)

## 2021-01-03 LAB — CA 125: Cancer Antigen (CA) 125: 2336 U/mL — ABNORMAL HIGH (ref 0.0–38.1)

## 2021-01-03 MED ORDER — FUROSEMIDE 40 MG PO TABS: 40.0000 mg | ORAL_TABLET | Freq: Every day | ORAL | Status: DC

## 2021-01-03 MED ORDER — FOLIC ACID 1 MG PO TABS: 1.0000 mg | ORAL_TABLET | Freq: Every day | ORAL | 2 refills | Status: DC

## 2021-01-03 MED ORDER — CYANOCOBALAMIN 100 MCG PO TABS: 100.0000 ug | ORAL_TABLET | Freq: Every day | ORAL | 2 refills | Status: DC

## 2021-01-03 NOTE — Plan of Care (Signed)
Patient discharged home with son. Problem: Education: Goal: Knowledge of General Education information will improve Description: Including pain rating scale, medication(s)/side effects and non-pharmacologic comfort measures Outcome: Adequate for Discharge   Problem: Health Behavior/Discharge Planning: Goal: Ability to manage health-related needs will improve Outcome: Adequate for Discharge   Problem: Clinical Measurements: Goal: Ability to maintain clinical measurements within normal limits will improve Outcome: Adequate for Discharge Goal: Will remain free from infection Outcome: Adequate for Discharge Goal: Diagnostic test results will improve Outcome: Adequate for Discharge Goal: Respiratory complications will improve Outcome: Adequate for Discharge Goal: Cardiovascular complication will be avoided Outcome: Adequate for Discharge   Problem: Activity: Goal: Risk for activity intolerance will decrease Outcome: Adequate for Discharge   Problem: Nutrition: Goal: Adequate nutrition will be maintained Outcome: Adequate for Discharge   Problem: Coping: Goal: Level of anxiety will decrease Outcome: Adequate for Discharge   Problem: Elimination: Goal: Will not experience complications related to bowel motility Outcome: Adequate for Discharge Goal: Will not experience complications related to urinary retention Outcome: Adequate for Discharge   Problem: Pain Managment: Goal: General experience of comfort will improve Outcome: Adequate for Discharge   Problem: Safety: Goal: Ability to remain free from injury will improve Outcome: Adequate for Discharge   Problem: Skin Integrity: Goal: Risk for impaired skin integrity will decrease Outcome: Adequate for Discharge

## 2021-01-03 NOTE — Discharge Summary (Signed)
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  In the ER, she was swollen.  CT showed peritoneal carcinomatosis, unclear primary, tense ascites.  CTA chest ruled out PE.    Malignant Ascites- (present on admission) --Given the periteoneal masses seen on CT, possibility of left ovarian cancer. --Paracentesis on 12/31 of 12L.  Still some ascites, and marked lower extremity swelling. --cont po lasix -- patient will follow-up with Dr. Janese Banks closely for further workup. Send out labs for rule out ovarian cancer pending.  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   --Dr. Janese Banks will follow up cytology, CEA and CA125 if these are not resulted by the time of discharge.  Dr. Janese Banks will arrange [place] follow up  -  Follow cytology - Follow CEA, CA125, CA19-9   Leg swelling- (present on admission) [place] LE ruled out DVT.   Hyponatremia- (present on admission) Hypervolemic.  Improved after paracentesis.  Urine sodium low.   - Continue fluid restriction   Hypokalemia- (present on admission) Mag nl - Continue K supplement   Anemia, folate and B12 deficiency- (present on admission) - Start B12 and folate, prescribed at discharge   Hypertension- (present on admission) BP normal off home lisinopril   Hyperlipidemia- (present on admission) -Continue atorvastatin   Class 1 obesity due to excess calories with body mass index (BMI) of 33.0 to 33.9 in adult BMI 31   Depression- (present on admission) -Continue Seroquel  patient will discharged to home with outpatient follow-up Dr. Janese Banks at the cancer center on the appointment that will be called in. Further paracentesis will be arranged by Dr. Janese Banks as outpatient. Son was present in the room and did voice understanding.    CONSULTS OBTAINED:  Treatment Team:  Sindy Guadeloupe, MD  DRUG ALLERGIES:  No Known Allergies  DISCHARGE MEDICATIONS:   Allergies as of 01/03/2021   No Known Allergies      Medication List     TAKE these medications    alendronate 70 MG tablet Commonly known as: FOSAMAX Take 70 mg by mouth once a week. Take with a full glass of water on an empty stomach.   atorvastatin 20 MG tablet Commonly known as: LIPITOR TAKE 1 TABLET BY MOUTH EVERY DAY   clonazePAM 1  MG tablet Commonly known as: KLONOPIN Take 1 mg by mouth daily as needed for anxiety.   cyanocobalamin 100 MCG tablet Take 1 tablet (100 mcg total) by mouth daily. Start taking on: January 04, 2021   FLUoxetine 40 MG capsule Commonly known as: PROZAC Take 40 mg by mouth daily.   folic acid 1 MG tablet Commonly known as: FOLVITE Take 1 tablet (1 mg total) by mouth daily. Start taking on: January 04, 2021   furosemide 40 MG tablet Commonly known as:  LASIX Take 40 mg by mouth daily.   lisinopril 10 MG tablet Commonly known as: ZESTRIL TAKE 1 TABLET BY MOUTH EVERY DAY   polyethylene glycol powder 17 GM/SCOOP powder Commonly known as: GLYCOLAX/MIRALAX Use as directed   QUEtiapine 100 MG tablet Commonly known as: SEROQUEL Take 1 tablet (100 mg total) by mouth at bedtime.   SUMAtriptan 100 MG tablet Commonly known as: IMITREX May repeat in 2 hours if headache persists or recurs.        If you experience worsening of your admission symptoms, develop shortness of breath, life threatening emergency, suicidal or homicidal thoughts you must seek medical attention immediately by calling 911 or calling your MD immediately  if symptoms less severe.  You Must read complete instructions/literature along with all the possible adverse reactions/side effects for all the Medicines you take and that have been prescribed to you. Take any new Medicines after you have completely understood and accept all the possible adverse reactions/side effects.   Please note  You were cared for by a hospitalist during your hospital stay. If you have any questions about your discharge medications or the care you received while you were in the hospital after you are discharged, you can call the unit and asked to speak with the hospitalist on call if the hospitalist that took care of you is not available. Once you are discharged, your primary care physician will handle any further medical issues. Please note that NO REFILLS for any discharge medications will be authorized once you are discharged, as it is imperative that you return to your primary care physician (or establish a relationship with a primary care physician if you do not have one) for your aftercare needs so that they can reassess your need for medications and monitor your lab values. Today   SUBJECTIVE    overall feels better. Son at bedside. VITAL SIGNS:  Blood pressure 132/71, pulse 93,  temperature 97.8 F (36.6 C), resp. rate 16, height 5\' 3"  (1.6 m), weight 80.4 kg, SpO2 98 %.  I/O:  No intake or output data in the 24 hours ending 01/03/21 1435  PHYSICAL EXAMINATION:  GENERAL:  76 y.o.-year-old patient lying in the bed with no acute distress.  LUNGS: Normal breath sounds bilaterally, no wheezing, rales,rhonchi or crepitation. No use of accessory muscles of respiration.  CARDIOVASCULAR: S1, S2 normal. No murmurs, rubs, or gallops.  ABDOMEN: Soft, non-tender, mild distended. Bowel sounds present. No organomegaly or mass. EXTREMITIES: trace pedal edema, no cyanosis, or clubbing.  NEUROLOGIC: non-focal PSYCHIATRIC:  patient is alert and oriented x 3.  SKIN: No obvious rash, lesion, or ulcer.   DATA REVIEW:   CBC  Recent Labs  Lab 01/02/21 0609  WBC 7.8  HGB 9.4*  HCT 29.4*  PLT 471*    Chemistries  Recent Labs  Lab 01/01/21 0208 01/01/21 0846 01/02/21 0609 01/03/21 0511  NA 125*  --    < > 132*  K 2.9*  --    < >  3.9  CL 94*  --    < > 101  CO2 21*  --    < > 24  GLUCOSE 144*  --    < > 115*  BUN 23  --    < > 16  CREATININE 1.13*  --    < > 0.64  CALCIUM 8.5*  --    < > 7.9*  MG  --  1.8  --   --   AST 21  --   --   --   ALT 10  --   --   --   ALKPHOS 64  --   --   --   BILITOT 0.5  --   --   --    < > = values in this interval not displayed.    Microbiology Results   Recent Results (from the past 240 hour(s))  Resp Panel by RT-PCR (Flu A&B, Covid) Nasopharyngeal Swab     Status: None   Collection Time: 01/01/21  2:04 AM   Specimen: Nasopharyngeal Swab; Nasopharyngeal(NP) swabs in vial transport medium  Result Value Ref Range Status   SARS Coronavirus 2 by RT PCR NEGATIVE NEGATIVE Final    Comment: (NOTE) SARS-CoV-2 target nucleic acids are NOT DETECTED.  The SARS-CoV-2 RNA is generally detectable in upper respiratory specimens during the acute phase of infection. The lowest concentration of SARS-CoV-2 viral copies this assay can detect  is 138 copies/mL. A negative result does not preclude SARS-Cov-2 infection and should not be used as the sole basis for treatment or other patient management decisions. A negative result may occur with  improper specimen collection/handling, submission of specimen other than nasopharyngeal swab, presence of viral mutation(s) within the areas targeted by this assay, and inadequate number of viral copies(<138 copies/mL). A negative result must be combined with clinical observations, patient history, and epidemiological information. The expected result is Negative.  Fact Sheet for Patients:  EntrepreneurPulse.com.au  Fact Sheet for Healthcare Providers:  IncredibleEmployment.be  This test is no t yet approved or cleared by the [place] FDA and  has been authorized for detection and/or diagnosis of SARS-CoV-2 by FDA under an Emergency Use Authorization (EUA). This EUA will remain  in effect (meaning this test can be used) for the duration of the COVID-19 declaration under Section 564(b)(1) of the Act, 21 U.S.C.section 360bbb-3(b)(1), unless the authorization is terminated  or revoked sooner.       Influenza A by PCR NEGATIVE NEGATIVE Final   Influenza B by PCR NEGATIVE NEGATIVE Final    Comment: (NOTE) The Xpert Xpress SARS-CoV-2/FLU/RSV plus assay is intended as an aid in the diagnosis of influenza from Nasopharyngeal swab specimens and should not be used as a sole basis for treatment. Nasal washings and aspirates are unacceptable for Xpert Xpress SARS-CoV-2/FLU/RSV testing.  Fact Sheet for Patients: EntrepreneurPulse.com.au  Fact Sheet for Healthcare Providers: IncredibleEmployment.be  This test is not yet approved or cleared by the [place] FDA and has been authorized for detection and/or diagnosis of SARS-CoV-2 by FDA under an Emergency Use Authorization (EUA). This EUA will remain in effect  (meaning this test can be used) for the duration of the COVID-19 declaration under Section 564(b)(1) of the Act, 21 U.S.C. section 360bbb-3(b)(1), unless the authorization is terminated or revoked.  Performed at [facility], [address] [place], [place] [address]   Body fluid culture w Gram Stain     Status: None (Preliminary result)   Collection Time: 01/01/21  2:25  PM   Specimen: PATH Cytology Peritoneal fluid  Result Value Ref Range Status   Specimen Description   Final    PERITONEAL Performed at [facility], [address], [place], [place] [address]    Special Requests   Final    NONE Performed at [facility], [address] [place], [place] [address]    Gram Stain   Final    NO SQUAMOUS EPITHELIAL CELLS SEEN FEW WBC SEEN NO ORGANISMS SEEN    Culture   Final    NO GROWTH 2 DAYS Performed at [facility], [place] [address] [place], [place] [address]    Report Status PENDING  Incomplete    RADIOLOGY:  US Venous Img Lower Bilateral (DVT)  Result Date: 01/02/2021 CLINICAL DATA:  Leg swelling.  Shortness of breath. EXAM: BILATERAL LOWER EXTREMITY VENOUS DOPPLER ULTRASOUND TECHNIQUE: Gray-scale sonography with graded compression, as well as color Doppler and duplex ultrasound were performed to evaluate the lower extremity deep venous systems from the level of the common femoral vein and including the common femoral, femoral, profunda femoral, popliteal and calf veins including the posterior tibial, peroneal and gastrocnemius veins when visible. The superficial great saphenous vein was also interrogated. Spectral Doppler was utilized to evaluate flow at rest and with distal augmentation maneuvers in the common femoral, femoral and popliteal veins. COMPARISON:  None. FINDINGS: RIGHT LOWER EXTREMITY Common Femoral Vein: No evidence of thrombus. Normal compressibility, respiratory phasicity and response to augmentation. Saphenofemoral Junction:  No evidence of thrombus. Normal compressibility and flow on color Doppler imaging. Profunda Femoral Vein: No evidence of thrombus. Normal compressibility and flow on color Doppler imaging. Femoral Vein: No evidence of thrombus. Normal compressibility, respiratory phasicity and response to augmentation. Popliteal Vein: No evidence of thrombus. Normal compressibility, respiratory phasicity and response to augmentation. Calf Veins: No evidence of thrombus. Normal compressibility and flow on color Doppler imaging. Other Findings: Fluid collection in the right popliteal fossa that measures 4.2 x 0.5 x 2.3 cm. Findings are suggestive for a Baker's cyst. LEFT LOWER EXTREMITY Common Femoral Vein: No evidence of thrombus. Normal compressibility, respiratory phasicity and response to augmentation. Saphenofemoral Junction: No evidence of thrombus. Normal compressibility and flow on color Doppler imaging. Profunda Femoral Vein: No evidence of thrombus. Normal compressibility and flow on color Doppler imaging. Femoral Vein: No evidence of thrombus. Normal compressibility, respiratory phasicity and response to augmentation. Popliteal Vein: No evidence of thrombus. Normal compressibility, respiratory phasicity and response to augmentation. Calf Veins: No evidence of thrombus. Normal compressibility and flow on color Doppler imaging. IMPRESSION: No evidence of deep venous thrombosis in either lower extremity. Right knee Baker's cyst. Electronically Signed   By: Markus Daft M.D.   On: 01/02/2021 10:00   US Paracentesis  Result Date: 01/01/2021 INDICATION: Patient with suspected peritoneal carcinomatosis presents today with large volume ascites. Interventional radiology asked to perform a diagnostic and therapeutic paracentesis. EXAM: ULTRASOUND GUIDED PARACENTESIS MEDICATIONS: 1% lidocaine 10 mL COMPLICATIONS: None immediate. PROCEDURE: Informed written consent was obtained from the patient after a discussion of the risks,  benefits and alternatives to treatment. A timeout was performed prior to the initiation of the procedure. Initial ultrasound scanning demonstrates a large amount of ascites within the right lower abdominal quadrant. The right lower abdomen was prepped and draped in the usual sterile fashion. 1% lidocaine was used for local anesthesia. Following this, a 19 gauge, 10-cm, Yueh catheter was introduced. An ultrasound image was saved for documentation purposes. The paracentesis was performed.  The catheter was removed and a dressing was applied. The patient tolerated the procedure well without immediate post procedural complication. Patient received post-procedure intravenous albumin; see nursing notes for details. FINDINGS: A total of approximately 12 L of amber-colored fluid was removed. Samples were sent to the laboratory as requested by the clinical team. IMPRESSION: Successful ultrasound-guided paracentesis yielding 12 liters of peritoneal fluid. Read by: Soyla Dryer, NP Electronically Signed   By: Albin Felling M.D.   On: 01/01/2021 15:31   ECHOCARDIOGRAM COMPLETE  Result Date: 01/03/2021    ECHOCARDIOGRAM REPORT   Patient Name:   Kathy Morton Date of Exam: 01/02/2021 Medical Rec #:  [id number]         Height:       63.0 in Accession #:    [id number]        Weight:       177.2 lb Date of Birth:  12-16-1945          BSA:          1.837 m Patient Age:    76 years          BP:           125/53 mmHg Patient Gender: F                 HR:           88 bpm. Exam Location:  [facility] Procedure: 2D Echo Indications:     Dyspnea R06.00  History:         Patient has no prior history of Echocardiogram examinations.  Sonographer:     Kathlen Brunswick RDCS Referring Phys:  [id number] CHRISTOPHER P DANFORD Diagnosing Phys: Kate Sable MD IMPRESSIONS  1. Left ventricular ejection fraction, by estimation, is 50 to 55%. The left ventricle has low normal function. The left ventricle has no regional wall motion abnormalities.  There is mild left ventricular hypertrophy of the basal-septal segment. Left ventricular diastolic parameters are consistent with Grade I diastolic dysfunction (impaired relaxation).  2. Right ventricular systolic function is normal. The right ventricular size is normal.  3. The mitral valve is normal in structure. No evidence of mitral valve regurgitation.  4. The aortic valve is tricuspid. Aortic valve regurgitation is not visualized. Aortic valve sclerosis/calcification is present, without any evidence of aortic stenosis. FINDINGS  Left Ventricle: Left ventricular ejection fraction, by estimation, is 50 to 55%. The left ventricle has low normal function. The left ventricle has no regional wall motion abnormalities. The left ventricular internal cavity size was normal in size. There is mild left ventricular hypertrophy of the basal-septal segment. Left ventricular diastolic parameters are consistent with Grade I diastolic dysfunction (impaired relaxation). Right Ventricle: The right ventricular size is normal. No increase in right ventricular wall thickness. Right ventricular systolic function is normal. Left Atrium: Left atrial size was normal in size. Right Atrium: Right atrial size was normal in size. Pericardium: There is no evidence of pericardial effusion. Mitral Valve: The mitral valve is normal in structure. No evidence of mitral valve regurgitation. Tricuspid Valve: The tricuspid valve is not well visualized. Tricuspid valve regurgitation is mild. Aortic Valve: The aortic valve is tricuspid. Aortic valve regurgitation is not visualized. Aortic valve sclerosis/calcification is present, without any evidence of aortic stenosis. Aortic valve peak gradient measures 11.8 mmHg. Pulmonic Valve: The pulmonic valve was not well visualized. Pulmonic valve regurgitation is not visualized. Aorta: The aortic root and ascending aorta are structurally normal, with no evidence of dilitation. IAS/Shunts:  No atrial level  shunt detected by color flow Doppler.  LEFT VENTRICLE PLAX 2D LVIDd:         4.30 cm      Diastology LVIDs:         2.90 cm      LV e' medial:    6.74 cm/s LV PW:         1.20 cm      LV E/e' medial:  8.3 LV IVS:        1.00 cm      LV e' lateral:   5.66 cm/s LVOT diam:     1.80 cm      LV E/e' lateral: 9.8 LV SV:         40 LV SV Index:   22 LVOT Area:     2.54 cm  LV Volumes (MOD) LV vol d, MOD A2C: 73.4 ml LV vol d, MOD A4C: 126.0 ml LV vol s, MOD A2C: 38.6 ml LV vol s, MOD A4C: 47.7 ml LV SV MOD A2C:     34.8 ml LV SV MOD A4C:     126.0 ml LV SV MOD BP:      55.2 ml RIGHT VENTRICLE RV Basal diam:  2.70 cm RV S prime:     12.00 cm/s TAPSE (M-mode): 1.8 cm LEFT ATRIUM             Index        RIGHT ATRIUM           Index LA diam:        4.00 cm 2.18 cm/m   RA Area:     10.00 cm LA Vol (A2C):   42.7 ml 23.25 ml/m  RA Volume:   19.50 ml  10.62 ml/m LA Vol (A4C):   21.8 ml 11.87 ml/m LA Biplane Vol: 32.6 ml 17.75 ml/m  AORTIC VALVE                 PULMONIC VALVE AV Area (Vmax): 1.41 cm     PV Vmax:       1.07 m/s AV Vmax:        172.00 cm/s  PV Peak grad:  4.6 mmHg AV Peak Grad:   11.8 mmHg LVOT Vmax:      95.30 cm/s LVOT Vmean:     61.200 cm/s LVOT VTI:       0.156 m  AORTA Ao Root diam: 2.40 cm Ao Asc diam:  2.60 cm MITRAL VALVE               TRICUSPID VALVE MV Area (PHT): 3.40 cm    TV Peak grad:   25.7 mmHg MV Decel Time: 223 msec    TV Vmax:        2.53 m/s MV E velocity: 55.70 cm/s MV A velocity: 93.40 cm/s  SHUNTS MV E/A ratio:  0.60        Systemic VTI:  0.16 m                            Systemic Diam: 1.80 cm Kate Sable MD Electronically signed by Kate Sable MD Signature Date/Time: 01/03/2021/10:15:32 AM    Final      CODE STATUS:     Code Status Orders  (From admission, onward)           Start     Ordered   01/01/21 1554  Full code  Continuous  01/01/21 1553           Code Status History     This patient has a current code status but no historical code  status.      Advance Directive Documentation    Flowsheet Row Most Recent Value  Type of Advance Directive Living will, Healthcare Power of Attorney  Pre-existing out of facility DNR order (yellow form or pink MOST form) --  "MOST" Form in Place? --        TOTAL TIME TAKING CARE OF THIS PATIENT: 40 minutes.    Fritzi Mandes M.D  [facility]    CC: Primary care physician; Leonides Sake, MD

## 2021-01-03 NOTE — TOC Initial Note (Signed)
Transition of Care [facility]) - Initial/Assessment Note    Patient Details  Name: Kathy Morton MRN: [id number] Date of Birth: July 25, 1945  Transition of Care [facility]) CM/SW Contact:    Beverly Sessions, RN Phone Number: 01/03/2021, 2:38 PM  Clinical Narrative:                  Transition of Care [facility]) Screening Note   Patient Details  Name: Kathy Morton Date of Birth: 09-Sep-1945   Transition of Care [facility]) CM/SW Contact:    Beverly Sessions, RN Phone Number: 01/03/2021, 2:39 PM    Transition of Care Department [facility]) has reviewed patient and no TOC needs have been identified at this time. We will continue to monitor patient advancement through interdisciplinary progression rounds. If new patient transition needs arise, please place a TOC consult.          Patient Goals and CMS Choice        Expected Discharge Plan and Services           Expected Discharge Date: 01/03/21                                    Prior Living Arrangements/Services                       Activities of Daily Living Home Assistive Devices/Equipment: Gilford Rile (specify type), Cane (specify quad or straight) (straight cane) ADL Screening (condition at time of admission) Patient's cognitive ability adequate to safely complete daily activities?: Yes Is the patient deaf or have difficulty hearing?: No Does the patient have difficulty seeing, even when wearing glasses/contacts?: No Does the patient have difficulty concentrating, remembering, or making decisions?: No Patient able to express need for assistance with ADLs?: Yes Does the patient have difficulty dressing or bathing?: No Independently performs ADLs?: Yes (appropriate for developmental age) Does the patient have difficulty walking or climbing stairs?: Yes Weakness of Legs: None Weakness of Arms/Hands: None  Permission Sought/Granted                  Emotional Assessment              Admission diagnosis:   Hypokalemia [E87.6] SOB (shortness of breath) [R06.02] Pleural effusion [J90] Other ascites [R18.8] Elevated brain natriuretic peptide (BNP) level [R79.89] Troponin I above reference range [R77.8] Ascites [R18.8] Patient Active Problem List   Diagnosis Date Noted   Ascites 01/01/2021   Peritoneal carcinomatosis ([place]) 01/01/2021   Hyponatremia 01/01/2021   Hypokalemia 01/01/2021   Anemia, folate and B12 deficiency 01/01/2021   Leg swelling 01/01/2021   Goals of care, counseling/discussion    Class 1 obesity due to excess calories without serious comorbidity with body mass index (BMI) of 31.0 to 31.9 in adult 12/02/2019   Anxiety 06/17/2019   Depression 08/19/2018   Binge eating disorder 06/01/2017   Cold sore 10/25/2016   Osteopenia of femoral neck, bilateral 04/26/2016   Benign paroxysmal positional vertigo 12/22/2015   Positive occult stool blood test    Internal hemorrhoids    Colon cancer screening 11/12/2015   Vaginal atrophy 05/22/2015   Hyperlipidemia 12/14/2014   Hypertension 12/14/2014   Chronic constipation 12/14/2014   Insomnia 12/14/2014   PTSD (post-traumatic stress disorder) 11/09/2014   Class 1 obesity due to excess calories with body mass index (BMI) of 33.0 to 33.9 in adult 11/09/2014   Migraines 11/09/2014  History of DVT (deep vein thrombosis) 11/09/2014   PCP:  Leonides Sake, MD Pharmacy:   [id number] [place], [place] [address] [place] [address] Phone: [phone] Fax: [phone]  [place], [place] [address] Phone: [phone] Fax: [phone]     Social Determinants of Health (SDOH) Interventions    Readmission Risk Interventions No flowsheet data found.

## 2021-01-04 ENCOUNTER — Telehealth: Payer: Self-pay | Admitting: Oncology

## 2021-01-04 LAB — CA 19-9 (SERIAL): CA 19-9: 17 U/mL (ref 0–35)

## 2021-01-04 NOTE — Telephone Encounter (Signed)
Left VM with patient with information about scheduled appointments on 1/11. Requested she call back to confirm.

## 2021-01-05 ENCOUNTER — Other Ambulatory Visit: Payer: Self-pay | Admitting: Family Medicine

## 2021-01-05 DIAGNOSIS — R0609 Other forms of dyspnea: Secondary | ICD-10-CM

## 2021-01-05 LAB — BODY FLUID CULTURE W GRAM STAIN
Culture: NO GROWTH
Gram Stain: NONE SEEN

## 2021-01-06 LAB — CYTOLOGY - NON PAP

## 2021-01-12 ENCOUNTER — Inpatient Hospital Stay (HOSPITAL_BASED_OUTPATIENT_CLINIC_OR_DEPARTMENT_OTHER): Payer: Medicare PPO | Admitting: Oncology

## 2021-01-12 ENCOUNTER — Inpatient Hospital Stay: Payer: Medicare PPO | Admitting: Oncology

## 2021-01-12 ENCOUNTER — Telehealth: Payer: Self-pay | Admitting: *Deleted

## 2021-01-12 ENCOUNTER — Inpatient Hospital Stay: Payer: Medicare PPO | Attending: Obstetrics and Gynecology | Admitting: Obstetrics and Gynecology

## 2021-01-12 ENCOUNTER — Inpatient Hospital Stay: Payer: Medicare PPO

## 2021-01-12 ENCOUNTER — Other Ambulatory Visit: Payer: Self-pay | Admitting: *Deleted

## 2021-01-12 ENCOUNTER — Other Ambulatory Visit: Payer: Self-pay

## 2021-01-12 VITALS — BP 132/74 | HR 104 | Temp 98.6°F | Resp 20 | Wt 170.2 lb

## 2021-01-12 DIAGNOSIS — D75839 Thrombocytosis, unspecified: Secondary | ICD-10-CM | POA: Diagnosis not present

## 2021-01-12 DIAGNOSIS — C763 Malignant neoplasm of pelvis: Secondary | ICD-10-CM | POA: Diagnosis not present

## 2021-01-12 DIAGNOSIS — F419 Anxiety disorder, unspecified: Secondary | ICD-10-CM | POA: Insufficient documentation

## 2021-01-12 DIAGNOSIS — D519 Vitamin B12 deficiency anemia, unspecified: Secondary | ICD-10-CM | POA: Diagnosis not present

## 2021-01-12 DIAGNOSIS — C786 Secondary malignant neoplasm of retroperitoneum and peritoneum: Secondary | ICD-10-CM | POA: Diagnosis not present

## 2021-01-12 DIAGNOSIS — E8809 Other disorders of plasma-protein metabolism, not elsewhere classified: Secondary | ICD-10-CM | POA: Diagnosis not present

## 2021-01-12 DIAGNOSIS — R634 Abnormal weight loss: Secondary | ICD-10-CM | POA: Diagnosis not present

## 2021-01-12 DIAGNOSIS — R180 Malignant ascites: Secondary | ICD-10-CM | POA: Diagnosis not present

## 2021-01-12 DIAGNOSIS — R188 Other ascites: Secondary | ICD-10-CM | POA: Insufficient documentation

## 2021-01-12 DIAGNOSIS — D509 Iron deficiency anemia, unspecified: Secondary | ICD-10-CM | POA: Insufficient documentation

## 2021-01-12 DIAGNOSIS — Z5112 Encounter for antineoplastic immunotherapy: Secondary | ICD-10-CM | POA: Diagnosis not present

## 2021-01-12 DIAGNOSIS — C569 Malignant neoplasm of unspecified ovary: Secondary | ICD-10-CM | POA: Insufficient documentation

## 2021-01-12 DIAGNOSIS — Z5189 Encounter for other specified aftercare: Secondary | ICD-10-CM | POA: Diagnosis not present

## 2021-01-12 DIAGNOSIS — Z7189 Other specified counseling: Secondary | ICD-10-CM | POA: Diagnosis not present

## 2021-01-12 DIAGNOSIS — D508 Other iron deficiency anemias: Secondary | ICD-10-CM

## 2021-01-12 DIAGNOSIS — E538 Deficiency of other specified B group vitamins: Secondary | ICD-10-CM

## 2021-01-12 DIAGNOSIS — Z5111 Encounter for antineoplastic chemotherapy: Secondary | ICD-10-CM | POA: Insufficient documentation

## 2021-01-12 MED ORDER — APIXABAN 2.5 MG PO TABS: 2.5000 mg | ORAL_TABLET | Freq: Two times a day (BID) | ORAL | 3 refills | Status: DC

## 2021-01-12 MED ORDER — CYANOCOBALAMIN 1000 MCG/ML IJ SOLN
1000.0000 ug | Freq: Once | INTRAMUSCULAR | Status: AC
  Administered 2021-01-12: 1000 ug via INTRAMUSCULAR

## 2021-01-12 NOTE — Progress Notes (Signed)
Gynecologic Oncology Consult Visit   Referring Provider: Dr. 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 G3P3 female who is seen in consultation from Dr. Janese Banks for malignant ascites with concern for ovarian cancer.   Patient presented to the ER with shortness of breath, leg swelling, abdominal distention.  She underwent CT angio chest which was negative for PE however, low volume chest with small pleural effusions were noted.  CT abdomen pelvis with contrast showed ascites with features consistent with underlying peritoneal carcinomatosis, asymmetric thickening and heterogeneity of the left ovary raising concern for primary ovarian cancer.  She underwent therapeutic paracentesis which removed approximately 12 L of fluid.  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  Rare cells demonstrate weak ER positivity.  CA-125- 2,336 CEA- 1.4 (normal)  At baseline patient lives alone and is independent of her ADL's. Son, Kathy Morton, lives 43 min away in [place]. She has two other sons that live in [place].  She has shortness of breath.   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   Hyponatremia 01/01/2021   Hypokalemia 01/01/2021   Anemia, folate and B12 deficiency 01/01/2021   Leg swelling 01/01/2021   Goals of care, counseling/discussion    Class 1 obesity due to excess calories without serious comorbidity with body mass index (BMI) of 31.0 to 31.9 in adult 12/02/2019   Anxiety 06/17/2019   Depression 08/19/2018   Binge eating disorder 06/01/2017   Cold sore 10/25/2016   Osteopenia of femoral neck, bilateral 04/26/2016   Benign paroxysmal positional vertigo 12/22/2015   Positive occult stool blood test    Internal hemorrhoids     Colon cancer screening 11/12/2015   Vaginal atrophy 05/22/2015   Hyperlipidemia 12/14/2014   Hypertension 12/14/2014   Chronic constipation 12/14/2014   Insomnia 12/14/2014   PTSD (post-traumatic stress disorder) 11/09/2014   Class 1 obesity due to excess calories with body mass index (BMI) of 33.0 to 33.9 in adult 11/09/2014   Migraines 11/09/2014   History of DVT (deep vein thrombosis) 11/09/2014    Past Medical History: Past Medical History:  Diagnosis Date   Anxiety    Depression    DVT of leg (deep venous thrombosis) (HCC)    Dysuria    History of blood clots    Hypertension    Incontinence    Migraine    Recurrent UTI     Past Surgical History: Past Surgical History:  Procedure Laterality Date   BREAST BIOPSY Left 01/06/13   benign finding of sclerosing adenosis   COLONOSCOPY WITH PROPOFOL N/A 11/29/2015   Procedure: COLONOSCOPY WITH PROPOFOL;  Surgeon: Jonathon Bellows, MD;  Location: [facility] ENDOSCOPY;  Service: Endoscopy;  Laterality: N/A;   KNEE SURGERY Left 2010   torn meniscus   TONSILECTOMY, ADENOIDECTOMY, BILATERAL MYRINGOTOMY AND TUBES      Past Gynecologic History:  Menarche: age 74 Post menopausal Denies history of STI Denies history of abnormal pap   OB History:  OB History  Gravida Para Term Preterm AB Living  '4 3     1 3  ' SAB IAB Ectopic Multiple Live Births  1            # Outcome Date GA Lbr Len/2nd Weight Sex Delivery Anes PTL Lv  4 Para  3 Para           2 Para           1 SAB             Obstetric Comments  1st Menstrual Cycle:  11  1st Pregnancy:  20    Family History: Family History  Problem Relation Age of Onset   Bladder Cancer Mother    Heart attack Father    Breast cancer Maternal Grandmother 80   Kidney disease Neg Hx     Social History: Social History   Socioeconomic History   Marital status: Divorced    Spouse name: Not on file   Number of children: Not on file   Years of education: Not on file    Highest education level: Some college, no degree  Occupational History   Occupation: retired   Tobacco Use   Smoking status: Never   Smokeless tobacco: Never  Vaping Use   Vaping Use: Never used  Substance and Sexual Activity   Alcohol use: No   Drug use: No   Sexual activity: Not Currently    Birth control/protection: None  Other Topics Concern   Not on file  Social History Narrative   Worked at the [employer].   Social Determinants of Health   Financial Resource Strain: Not on file  Food Insecurity: Not on file  Transportation Needs: Not on file  Physical Activity: Not on file  Stress: Not on file  Social Connections: Not on file  Intimate Partner Violence: Not on file   Allergies: No Known Allergies  Current Medications: Current Outpatient Medications  Medication Sig Dispense Refill   alendronate (FOSAMAX) 70 MG tablet Take 70 mg by mouth once a week. Take with a full glass of water on an empty stomach.     atorvastatin (LIPITOR) 20 MG tablet TAKE 1 TABLET BY MOUTH EVERY DAY 90 tablet 0   clonazePAM (KLONOPIN) 1 MG tablet Take 1 mg by mouth daily as needed for anxiety.     folic acid (FOLVITE) 1 MG tablet Take 1 tablet (1 mg total) by mouth daily. 30 tablet 2   furosemide (LASIX) 40 MG tablet Take 40 mg by mouth daily.     polyethylene glycol powder (GLYCOLAX/MIRALAX) 17 GM/SCOOP powder Use as directed     QUEtiapine (SEROQUEL) 100 MG tablet Take 1 tablet (100 mg total) by mouth at bedtime. 90 tablet 1   SUMAtriptan (IMITREX) 100 MG tablet May repeat in 2 hours if headache persists or recurs. 9 tablet 5   vitamin B-12 100 MCG tablet Take 1 tablet (100 mcg total) by mouth daily. 30 tablet 2   FLUoxetine (PROZAC) 40 MG capsule Take 40 mg by mouth daily. (Patient not taking: Reported on 01/12/2021)     No current facility-administered medications for this visit.   General: negative for fevers positive for weight gain, night sweats, weakness, fatigue Skin:  negative for changes in moles or sores or rash Eyes: negative for changes in vision HEENT: negative for change in hearing, tinnitus, voice changes Pulmonary: negative for dyspnea, orthopnea, productive cough, wheezing Cardiac: negative for palpitations, pain Gastrointestinal: negative for nausea, vomiting, diarrhea, hematemesis, hematochezia positive for constipation & abdominal distension Genitourinary/Sexual: negative for dysuria, retention, hematuria, incontinence Ob/Gyn:  negative for abnormal bleeding, or pain Musculoskeletal: negative for pain, joint pain, back pain Hematology: negative for easy bruising, abnormal bleeding Neurologic/Psych: negative for seizures, paralysis, weakness, numbness positive for headaches   Objective:  Physical Examination:  BP 132/74    Pulse (!) 104    Temp 98.6 F (37 C)    Resp 20    Wt 170 lb 3.2 oz (77.2 kg)    SpO2 98%    BMI 30.15 kg/m     ECOG Performance Status: 3 - Symptomatic, >50% confined to bed  GENERAL: Patient is a well appearing female in no acute distress HEENT:  PERRL, neck supple with midline trachea. Thyroid without masses.  NODES:  No cervical, R supraclavicular, axillary, or inguinal lymphadenopathy palpated. L supraclavicular nodes are firm but not grossly enlarged. LUNGS:  Clear to auscultation bilaterally.  No wheezes or rhonchi. HEART:  Regular rate and rhythm. No murmur appreciated. ABDOMEN:  Nontender. Firm and very distended with ascites. No obvious masses; firmness right lower quadrant. No hernias. MSK:  No focal spinal tenderness to palpation. Full range of motion bilaterally in the upper extremities. EXTREMITIES:  No peripheral edema.   SKIN:  Clear with no obvious rashes or skin changes. No nail dyscrasia. NEURO:  Nonfocal. Well oriented.  Appropriate affect.   Pelvic- Exam chaperoned by CMA.  EGBUS: no lesions Cervix: no lesions, nontender, mobile Vagina: no lesions, no discharge or bleeding Uterus: normal size,  nontender, mobile Adnexa: no palpable masses bilaterally; + cul-de-sac nodularity.  Rectovaginal: deferred  Lab Review  Lab Results  Component Value Date   WBC 7.8 01/02/2021   HGB 9.4 (L) 01/02/2021   HCT 29.4 (L) 01/02/2021   MCV 85.7 01/02/2021   PLT 471 (H) 01/02/2021     Chemistry      Component Value Date/Time   NA 132 (L) 01/03/2021 0511   NA 135 03/10/2015 0923   K 3.9 01/03/2021 0511   CL 101 01/03/2021 0511   CO2 24 01/03/2021 0511   BUN 16 01/03/2021 0511   BUN 11 03/10/2015 0923   CREATININE 0.64 01/03/2021 0511   CREATININE 0.97 (H) 10/20/2016 0844      Component Value Date/Time   CALCIUM 7.9 (L) 01/03/2021 0511   ALKPHOS 64 01/01/2021 0208   AST 21 01/01/2021 0208   ALT 10 01/01/2021 0208   BILITOT 0.5 01/01/2021 0208   BILITOT <0.2 11/11/2014 1624     Urinalysis    Component Value Date/Time   COLORURINE YELLOW (A) 01/01/2021 0932   APPEARANCEUR HAZY (A) 01/01/2021 0932   APPEARANCEUR Hazy (A) 06/07/2015 1007   LABSPEC 1.016 01/01/2021 0932   PHURINE 5.0 01/01/2021 0932   GLUCOSEU 50 (A) 01/01/2021 0932   HGBUR SMALL (A) 01/01/2021 0932   BILIRUBINUR NEGATIVE 01/01/2021 0932   BILIRUBINUR Negative 06/07/2015 1007   KETONESUR 5 (A) 01/01/2021 0932   PROTEINUR 100 (A) 01/01/2021 0932   UROBILINOGEN negative 04/26/2015 0932   NITRITE NEGATIVE 01/01/2021 0932   LEUKOCYTESUR SMALL (A) 01/01/2021 0932  Mag 1.8 Albumin 3.0   Folate 5.1 low B12 131 low Iron 33 TIBC 218 Low Tsat 15 Ferritin 354 TSH 2.3 normal  Component Ref Range & Units 11 d ago   Cancer Antigen (CA) 125 0.0 - 38.1 U/mL 2,336.0 High     Component Ref Range & Units 11 d ago   CEA 0.0 - 4.7 ng/mL 1.4       Component Ref Range & Units 11 d ago   B Natriuretic Peptide 0.0 - 100.0 pg/mL 243.5 High     Radiologic Imaging:  DG Chest 2 View  Result Date: 01/01/2021 CLINICAL DATA:  Shortness of breath. EXAM: CHEST - 2 VIEW COMPARISON:  PA Lat 12/30/2020.  FINDINGS: The  cardiac size is normal. There is mild aortic uncoiling, small amount of calcification. There are small pleural effusions collecting posteriorly, which was seen previously. The lungs are expiratory with very limited visualization of the lower lung fields. The visualized aerated lungs are clear. Osteopenia. IMPRESSION: Limited expiratory study. The lower lung fields are poorly evaluated. Visualized aerated lungs are clear. There are small pleural effusions. Overall aeration seems unchanged. Electronically Signed   By: Telford Nab M.D.   On: 01/01/2021 02:50   DG Chest 2 View  Result Date: 12/30/2020 CLINICAL DATA:  Shortness of breath with exertion for the past 2 weeks. EXAM: CHEST - 2 VIEW COMPARISON:  None. FINDINGS: The heart size and mediastinal contours are within normal limits. Normal pulmonary vascularity. Low lung volumes. Trace bilateral pleural effusions. No consolidation or pneumothorax. No acute osseous abnormality. IMPRESSION: 1. Low lung volumes with trace bilateral pleural effusions. Electronically Signed   By: Titus Dubin M.D.   On: 12/30/2020 12:42   CT Angio Chest PE W and/or Wo Contrast  Result Date: 01/01/2021 CLINICAL DATA:  Acute, nonlocalized abdominal pain. Shortness of breath for 1 week. EXAM: CT ANGIOGRAPHY CHEST CT ABDOMEN AND PELVIS WITH CONTRAST TECHNIQUE: Multidetector CT imaging of the chest was performed using the standard protocol during bolus administration of intravenous contrast. Multiplanar CT image reconstructions and MIPs were obtained to evaluate the vascular anatomy. Multidetector CT imaging of the abdomen and pelvis was performed using the standard protocol during bolus administration of intravenous contrast. CONTRAST:  111m OMNIPAQUE IOHEXOL 350 MG/ML SOLN COMPARISON:  Abdominal CT 05/26/2015 FINDINGS: CTA CHEST FINDINGS Cardiovascular: Satisfactory opacification of the pulmonary arteries to the segmental level on second injection, first being severely  degraded by bolus dispersion. No evidence of pulmonary embolism. Normal heart size. No pericardial effusion. Coronary atherosclerosis. Mediastinum/Nodes: No mass or adenopathy. Lungs/Pleura: Small pleural effusions on the right more than left with atelectasis. Musculoskeletal: No acute or aggressive finding Review of the MIP images confirms the above findings. CT ABDOMEN and PELVIS FINDINGS Hepatobiliary: Compressed liver due to the size of ascites. Cholelithiasis without acute cholecystitis or ductal dilatation Pancreas: No acute finding.  No inflammation or mass seen Spleen: Negative Adrenals/Urinary Tract: Negative adrenals and kidneys. Largely collapsed bladder. Stomach/Bowel: No obstruction, inflammation, or visible mass Vascular/Lymphatic: Atheromatous calcification. Nodularity in the left groin which is likely tumor within the remnant inguinal canal. Reproductive: Asymmetric heterogeneous thickening of the left ovary measuring 3.3 cm. Other: Large volume ascites with tense abdomen. Omental caking especially below the transverse colon. Nodularity also seen in the pelvic peritoneal recesses. Anasarca. Musculoskeletal: No acute finding Review of the MIP images confirms the above findings. IMPRESSION: 1. 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 A primary is not certain, but there is notable asymmetric thickening and heterogeneity of the left ovary (primary versus metastatic deposits). 2. Low volume chest with small pleural effusions and mild atelectasis. Negative for pulmonary embolism. Electronically Signed   By: JJorje GuildM.D.   On: 01/01/2021 10:04   CT ABDOMEN PELVIS W CONTRAST  Result Date: 01/01/2021 CLINICAL DATA:  Acute, nonlocalized abdominal pain. Shortness of breath for 1 week. EXAM: CT ANGIOGRAPHY CHEST CT ABDOMEN AND PELVIS WITH CONTRAST TECHNIQUE: Multidetector CT imaging of the chest was performed using the standard protocol during bolus administration of  intravenous contrast. Multiplanar CT image reconstructions and MIPs were obtained to evaluate the vascular anatomy. Multidetector CT imaging of the abdomen and pelvis was performed using the standard protocol during bolus administration  of intravenous contrast. CONTRAST:  159m OMNIPAQUE IOHEXOL 350 MG/ML SOLN COMPARISON:  Abdominal CT 05/26/2015 FINDINGS: CTA CHEST FINDINGS Cardiovascular: Satisfactory opacification of the pulmonary arteries to the segmental level on second injection, first being severely degraded by bolus dispersion. No evidence of pulmonary embolism. Normal heart size. No pericardial effusion. Coronary atherosclerosis. Mediastinum/Nodes: No mass or adenopathy. Lungs/Pleura: Small pleural effusions on the right more than left with atelectasis. Musculoskeletal: No acute or aggressive finding Review of the MIP images confirms the above findings. CT ABDOMEN and PELVIS FINDINGS Hepatobiliary: Compressed liver due to the size of ascites. Cholelithiasis without acute cholecystitis or ductal dilatation Pancreas: No acute finding.  No inflammation or mass seen Spleen: Negative Adrenals/Urinary Tract: Negative adrenals and kidneys. Largely collapsed bladder. Stomach/Bowel: No obstruction, inflammation, or visible mass Vascular/Lymphatic: Atheromatous calcification. Nodularity in the left groin which is likely tumor within the remnant inguinal canal. Reproductive: Asymmetric heterogeneous thickening of the left ovary measuring 3.3 cm. Other: Large volume ascites with tense abdomen. Omental caking especially below the transverse colon. Nodularity also seen in the pelvic peritoneal recesses. Anasarca. Musculoskeletal: No acute finding Review of the MIP images confirms the above findings. IMPRESSION: 1. 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 A primary is not certain, but there is notable asymmetric thickening and heterogeneity of the left ovary (primary versus metastatic  deposits). 2. Low volume chest with small pleural effusions and mild atelectasis. Negative for pulmonary embolism. Electronically Signed   By: JJorje GuildM.D.   On: 01/01/2021 10:04   UK[place]Venous Img Lower Bilateral (DVT)  Result Date: 01/02/2021 CLINICAL DATA:  Leg swelling.  Shortness of breath. EXAM: BILATERAL LOWER EXTREMITY VENOUS DOPPLER ULTRASOUND TECHNIQUE: Gray-scale sonography with graded compression, as well as color Doppler and duplex ultrasound were performed to evaluate the lower extremity deep venous systems from the level of the common femoral vein and including the common femoral, femoral, profunda femoral, popliteal and calf veins including the posterior tibial, peroneal and gastrocnemius veins when visible. The superficial great saphenous vein was also interrogated. Spectral Doppler was utilized to evaluate flow at rest and with distal augmentation maneuvers in the common femoral, femoral and popliteal veins. COMPARISON:  None. FINDINGS: RIGHT LOWER EXTREMITY Common Femoral Vein: No evidence of thrombus. Normal compressibility, respiratory phasicity and response to augmentation. Saphenofemoral Junction: No evidence of thrombus. Normal compressibility and flow on color Doppler imaging. Profunda Femoral Vein: No evidence of thrombus. Normal compressibility and flow on color Doppler imaging. Femoral Vein: No evidence of thrombus. Normal compressibility, respiratory phasicity and response to augmentation. Popliteal Vein: No evidence of thrombus. Normal compressibility, respiratory phasicity and response to augmentation. Calf Veins: No evidence of thrombus. Normal compressibility and flow on color Doppler imaging. Other Findings: Fluid collection in the right popliteal fossa that measures 4.2 x 0.5 x 2.3 cm. Findings are suggestive for a Baker's cyst. LEFT LOWER EXTREMITY Common Femoral Vein: No evidence of thrombus. Normal compressibility, respiratory phasicity and response to augmentation.  Saphenofemoral Junction: No evidence of thrombus. Normal compressibility and flow on color Doppler imaging. Profunda Femoral Vein: No evidence of thrombus. Normal compressibility and flow on color Doppler imaging. Femoral Vein: No evidence of thrombus. Normal compressibility, respiratory phasicity and response to augmentation. Popliteal Vein: No evidence of thrombus. Normal compressibility, respiratory phasicity and response to augmentation. Calf Veins: No evidence of thrombus. Normal compressibility and flow on color Doppler imaging. IMPRESSION: No evidence of deep venous thrombosis in either lower extremity. Right knee Baker's cyst. Electronically Signed   By: AQuita Skye  Anselm Pancoast M.D.   On: 01/02/2021 10:00   US Paracentesis  Result Date: 01/01/2021 INDICATION: Patient with suspected peritoneal carcinomatosis presents today with large volume ascites. Interventional radiology asked to perform a diagnostic and therapeutic paracentesis. EXAM: ULTRASOUND GUIDED PARACENTESIS MEDICATIONS: 1% lidocaine 10 mL COMPLICATIONS: None immediate. PROCEDURE: Informed written consent was obtained from the patient after a discussion of the risks, benefits and alternatives to treatment. A timeout was performed prior to the initiation of the procedure. Initial ultrasound scanning demonstrates a large amount of ascites within the right lower abdominal quadrant. The right lower abdomen was prepped and draped in the usual sterile fashion. 1% lidocaine was used for local anesthesia. Following this, a 19 gauge, 10-cm, Yueh catheter was introduced. An ultrasound image was saved for documentation purposes. The paracentesis was performed. The catheter was removed and a dressing was applied. The patient tolerated the procedure well without immediate post procedural complication. Patient received post-procedure intravenous albumin; see nursing notes for details. FINDINGS: A total of approximately 12 L of amber-colored fluid was removed. Samples  were sent to the laboratory as requested by the clinical team. IMPRESSION: Successful ultrasound-guided paracentesis yielding 12 liters of peritoneal fluid. Read by: Soyla Dryer, NP Electronically Signed   By: Albin Felling M.D.   On: 01/01/2021 15:31   DG Abd Portable 2 Views  Result Date: 01/01/2021 CLINICAL DATA:  Abdominal distension EXAM: PORTABLE ABDOMEN - 2 VIEW COMPARISON:  05/26/2015 FINDINGS: Gas and stool throughout the colon with relative sparing of the descending colon, but no convincing obstruction. No concerning mass effect or calcification. Lumbar spine degeneration and mild scoliosis. IMPRESSION: Moderate distension of the colon by gas and stool Electronically Signed   By: Jorje Guild M.D.   On: 01/01/2021 04:03   ECHOCARDIOGRAM COMPLETE  Result Date: 01/03/2021    ECHOCARDIOGRAM REPORT   Patient Name:   SALIMAH MARTINOVICH Date of Exam: 01/02/2021 Medical Rec #:  [id number]         Height:       63.0 in Accession #:    [id number]        Weight:       177.2 lb Date of Birth:  [birth year]          BSA:          1.837 m Patient Age:    65 years          BP:           125/53 mmHg Patient Gender: F                 HR:           88 bpm. Exam Location:  [facility] Procedure: 2D Echo Indications:     Dyspnea R06.00  History:         Patient has no prior history of Echocardiogram examinations.  Sonographer:     Kathlen Brunswick RDCS Referring Phys:  [id number] CHRISTOPHER P DANFORD Diagnosing Phys: Kate Sable MD IMPRESSIONS  1. Left ventricular ejection fraction, by estimation, is 50 to 55%. The left ventricle has low normal function. The left ventricle has no regional wall motion abnormalities. There is mild left ventricular hypertrophy of the basal-septal segment. Left ventricular diastolic parameters are consistent with Grade I diastolic dysfunction (impaired relaxation).  2. Right ventricular systolic function is normal. The right ventricular size is normal.  3. The mitral valve is normal in  structure. No evidence of mitral valve regurgitation.  4. The aortic valve is tricuspid. Aortic  valve regurgitation is not visualized. Aortic valve sclerosis/calcification is present, without any evidence of aortic stenosis. FINDINGS  Left Ventricle: Left ventricular ejection fraction, by estimation, is 50 to 55%. The left ventricle has low normal function. The left ventricle has no regional wall motion abnormalities. The left ventricular internal cavity size was normal in size. There is mild left ventricular hypertrophy of the basal-septal segment. Left ventricular diastolic parameters are consistent with Grade I diastolic dysfunction (impaired relaxation). Right Ventricle: The right ventricular size is normal. No increase in right ventricular wall thickness. Right ventricular systolic function is normal. Left Atrium: Left atrial size was normal in size. Right Atrium: Right atrial size was normal in size. Pericardium: There is no evidence of pericardial effusion. Mitral Valve: The mitral valve is normal in structure. No evidence of mitral valve regurgitation. Tricuspid Valve: The tricuspid valve is not well visualized. Tricuspid valve regurgitation is mild. Aortic Valve: The aortic valve is tricuspid. Aortic valve regurgitation is not visualized. Aortic valve sclerosis/calcification is present, without any evidence of aortic stenosis. Aortic valve peak gradient measures 11.8 mmHg. Pulmonic Valve: The pulmonic valve was not well visualized. Pulmonic valve regurgitation is not visualized. Aorta: The aortic root and ascending aorta are structurally normal, with no evidence of dilitation. IAS/Shunts: No atrial level shunt detected by color flow Doppler.  LEFT VENTRICLE PLAX 2D LVIDd:         4.30 cm      Diastology LVIDs:         2.90 cm      LV e' medial:    6.74 cm/s LV PW:         1.20 cm      LV E/e' medial:  8.3 LV IVS:        1.00 cm      LV e' lateral:   5.66 cm/s LVOT diam:     1.80 cm      LV E/e' lateral: 9.8  LV SV:         40 LV SV Index:   22 LVOT Area:     2.54 cm  LV Volumes (MOD) LV vol d, MOD A2C: 73.4 ml LV vol d, MOD A4C: 126.0 ml LV vol s, MOD A2C: 38.6 ml LV vol s, MOD A4C: 47.7 ml LV SV MOD A2C:     34.8 ml LV SV MOD A4C:     126.0 ml LV SV MOD BP:      55.2 ml RIGHT VENTRICLE RV Basal diam:  2.70 cm RV S prime:     12.00 cm/s TAPSE (M-mode): 1.8 cm LEFT ATRIUM             Index        RIGHT ATRIUM           Index LA diam:        4.00 cm 2.18 cm/m   RA Area:     10.00 cm LA Vol (A2C):   42.7 ml 23.25 ml/m  RA Volume:   19.50 ml  10.62 ml/m LA Vol (A4C):   21.8 ml 11.87 ml/m LA Biplane Vol: 32.6 ml 17.75 ml/m  AORTIC VALVE                 PULMONIC VALVE AV Area (Vmax): 1.41 cm     PV Vmax:       1.07 m/s AV Vmax:        172.00 cm/s  PV Peak grad:  4.6 mmHg AV Peak Grad:  11.8 mmHg LVOT Vmax:      95.30 cm/s LVOT Vmean:     61.200 cm/s LVOT VTI:       0.156 m  AORTA Ao Root diam: 2.40 cm Ao Asc diam:  2.60 cm MITRAL VALVE               TRICUSPID VALVE MV Area (PHT): 3.40 cm    TV Peak grad:   25.7 mmHg MV Decel Time: 223 msec    TV Vmax:        2.53 m/s MV E velocity: 55.70 cm/s MV A velocity: 93.40 cm/s  SHUNTS MV E/A ratio:  0.60        Systemic VTI:  0.16 m                            Systemic Diam: 1.80 cm Kate Sable MD Electronically signed by Kate Sable MD Signature Date/Time: 01/03/2021/10:15:32 AM    Final        Assessment:  DORINNE GRAEFF is a 76 y.o. 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   Anemia with Low folate, B12, and TIBC.   Proteinuria  History of incontinence  Elevated  B Natriuretic Peptide; ECHO - left ventricle has low normal function.  Medical co-morbidities complicating care: HTN; history of DVT (upon review with patient may have been superficial phlebitus). Body mass index is 30.15  kg/m.   Plan:   Problem List Items Addressed This Visit       Other   Ascites   Other Visit Diagnoses     Malignant neoplasm of ovary, unspecified laterality ([place])    -  Primary   Anemia due to vitamin B12 deficiency, unspecified B12 deficiency type       Folate deficiency       Hypoalbuminemia       Thrombocytosis          We discussed options for management including including primary surgical debulking followed by chemotherapy versus neoadjuvant chemotherapy followed by interval debulking surgery then additional chemotherapy.  The pros and cons of each approach were discussed. We recommended neoadjuvant chemotherapy with interval debulking surgery given her extensive disease, poor performance status, and hypoalbuminemia. Findings and diagnosis discussed at length with the patient and her son, Kathy Morton. Pathology consistent with gyn primary. We recommended paclitaxel and carboplatin as well as the addition of bevacizumab as long as she does not have significant proteinuria based on 24 hour urine study. Doxil + carboplatin with or without bevacizumab is also an option as she expressed concern regarding hair loss. We defer this decision to Dr. Janese Banks. If bev is added we need to hold bev the cycle prior to surgery (in this case plan for surgery after cycle #4) and hold bev the cycle right after surgery.   Recommend genetic testing and HRD tumor testing.   Suggested return to clinic in  3 months with PET/CT scan to assess for distant mets and inguinal node disease.    Gyn Oncology Chemotherapy VTE Prophylaxis Algorithm  Khorana Score: Gynecologic malignancy, Hgb <10 or use of RBC growth factors and Platelets >/=350 Score results:  >/=2  Initiate throboprophylaxis with DOAC as long as no contraindication -  recommend either rivaroxaban 10 mg daily or apixaban 2.5 mg BID. We will let Dr. Janese Banks know our recommendations.   Anemia -  continued folate supplementation. Dr. Janese Banks planning on IM B12. We also  requested IV Iron based on her low TSAT level <20% to help reduce the risk of periop transfusion.  Referral to nutrition for hypoalbuminemia.    The patient's diagnosis, an outline of the further diagnostic and laboratory studies which will be required, the recommendation for surgery, and alternatives were discussed with her and her accompanying family members.  All questions were answered to their satisfaction.  I personally had a face to face interaction and evaluated the patient jointly with the NP, Ms. Beckey Rutter.  I have reviewed her history and available records and have performed the key portions of the physical exam including lymph node survey, abdominal exam, pelvic exam with my findings confirming those documented above by the APP.  I have discussed the case with the APP and the patient.  I agree with the above documentation, assessment and plan which was fully formulated by me.  Counseling was completed by me.   I personally saw the patient and performed a substantive portion of this encounter in conjunction with the listed APP as documented above.  Cadarius Nevares Gaetana Michaelis, MD

## 2021-01-12 NOTE — Telephone Encounter (Signed)
Called and spoke to son and let him know that we were able to get port insertion and thoracentesis at one time on Monday. However she does need to start the eliquis until after the port insertion. The pharmacy did not have it in stock so she did not start it today and will  not start until Monday evening after procedure. Then jennifer called back few min.later with the time on Monday to get the procedures done . [facility] with [facility], and NPO after midnight the night before

## 2021-01-13 ENCOUNTER — Encounter: Payer: Self-pay | Admitting: Oncology

## 2021-01-13 DIAGNOSIS — F419 Anxiety disorder, unspecified: Secondary | ICD-10-CM | POA: Diagnosis not present

## 2021-01-13 DIAGNOSIS — R188 Other ascites: Secondary | ICD-10-CM | POA: Diagnosis not present

## 2021-01-13 DIAGNOSIS — D509 Iron deficiency anemia, unspecified: Secondary | ICD-10-CM | POA: Insufficient documentation

## 2021-01-13 DIAGNOSIS — D519 Vitamin B12 deficiency anemia, unspecified: Secondary | ICD-10-CM | POA: Diagnosis not present

## 2021-01-13 DIAGNOSIS — Z5112 Encounter for antineoplastic immunotherapy: Secondary | ICD-10-CM | POA: Diagnosis not present

## 2021-01-13 DIAGNOSIS — C763 Malignant neoplasm of pelvis: Secondary | ICD-10-CM | POA: Insufficient documentation

## 2021-01-13 DIAGNOSIS — C569 Malignant neoplasm of unspecified ovary: Secondary | ICD-10-CM | POA: Diagnosis not present

## 2021-01-13 DIAGNOSIS — Z5111 Encounter for antineoplastic chemotherapy: Secondary | ICD-10-CM | POA: Diagnosis not present

## 2021-01-13 DIAGNOSIS — R634 Abnormal weight loss: Secondary | ICD-10-CM | POA: Diagnosis not present

## 2021-01-13 DIAGNOSIS — C786 Secondary malignant neoplasm of retroperitoneum and peritoneum: Secondary | ICD-10-CM | POA: Diagnosis not present

## 2021-01-13 MED ORDER — ONDANSETRON HCL 8 MG PO TABS: 8.0000 mg | ORAL_TABLET | Freq: Two times a day (BID) | ORAL | 1 refills | Status: DC | PRN

## 2021-01-13 MED ORDER — LORAZEPAM 0.5 MG PO TABS: 0.5000 mg | ORAL_TABLET | Freq: Four times a day (QID) | ORAL | 0 refills | Status: DC | PRN

## 2021-01-13 MED ORDER — DEXAMETHASONE 4 MG PO TABS: 8.0000 mg | ORAL_TABLET | Freq: Every day | ORAL | 1 refills | Status: DC

## 2021-01-13 MED ORDER — LIDOCAINE-PRILOCAINE 2.5-2.5 % EX CREA: TOPICAL_CREAM | CUTANEOUS | 3 refills | Status: DC

## 2021-01-13 MED ORDER — PROCHLORPERAZINE MALEATE 10 MG PO TABS: 10.0000 mg | ORAL_TABLET | Freq: Four times a day (QID) | ORAL | 1 refills | Status: DC | PRN

## 2021-01-13 NOTE — Progress Notes (Signed)
START ON PATHWAY REGIMEN - Ovarian     A cycle is every 21 days:     Paclitaxel      Carboplatin   **Always confirm dose/schedule in your pharmacy ordering system**  Patient Characteristics: Preoperative or Nonsurgical Candidate (Clinical Staging), Newly Diagnosed, Neoadjuvant Therapy followed by Surgery BRCA Mutation Status: Awaiting Test Results Therapeutic Status: Preoperative or Nonsurgical Candidate (Clinical Staging) AJCC T Category: cTX AJCC 8 Stage Grouping: IVB AJCC N Category: cNX AJCC M Category: GB8O Therapy Plan: Neoadjuvant Therapy followed by Surgery Intent of Therapy: Curative Intent, Discussed with Patient

## 2021-01-13 NOTE — Progress Notes (Signed)
Patient on schedule for [facility] placement/thoracentesis 1/16, called and spoke with patient with pre procedure instructions given. Made aware to be here @ 1000, NPO after MN prior to procedure as well as driver post procedure/recovery/discharge. Stated understanding.

## 2021-01-13 NOTE — Progress Notes (Signed)
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  She had CT angio chest which did not show any evidence of PE.  Low volume chest with small pleural effusions.  CT abdomen and pelvis with contrast showed tense ascites with features of underlying peritoneal carcinomatosis.  Asymmetric thickening and heterogeneity of the left ovary raising the concern for potential ovarian cancer.  Nodularity in the left groin which is likely tumor within the remnant inguinal canal.  Cytology consistent with high-grade serous carcinoma.The carcinoma is positive for PAX-8 and WT-1. Rare cells demonstrate  weak ER positivity. The cytomorphologic findings in conjunction with the  pattern of immunoreactivity are consistent with the above diagnosis. CA125 elevated at 2336.  Interval history-patient feels that her abdominal distention is gradually getting worse after her last paracentesis.  She lives alone and has been independent of her ADLs.  Denies any significant pain presently.  Leg swelling has improved  ECOG PS- 2 Pain scale- 0   Review of systems- Review of Systems  Constitutional:  Positive for malaise/fatigue. Negative for chills, fever and weight loss.  HENT:  Negative for congestion, ear discharge and nosebleeds.   Eyes:  Negative for  blurred vision.  Respiratory:  Negative for cough, hemoptysis, sputum production, shortness of breath and wheezing.   Cardiovascular:  Negative for chest pain, palpitations, orthopnea and claudication.  Gastrointestinal:  Negative for abdominal pain, blood in stool, constipation, diarrhea, heartburn, melena, nausea and vomiting.       Abdominal distention  Genitourinary:  Negative for dysuria, flank pain, frequency, hematuria and urgency.  Musculoskeletal:  Negative for back pain, joint pain and myalgias.  Skin:  Negative for rash.  Neurological:  Negative for dizziness, tingling, focal weakness, seizures, weakness and headaches.  Endo/Heme/Allergies:  Does not bruise/bleed easily.  Psychiatric/Behavioral:  Negative for depression and suicidal ideas. The patient does not have insomnia.       No Known Allergies   Past Medical History:  Diagnosis Date   Anxiety    Depression    DVT of leg (deep venous thrombosis) (HCC)    Dysuria    History of blood clots    Hypertension    Incontinence    Migraine    Recurrent UTI      Past Surgical History:  Procedure Laterality Date   BREAST BIOPSY Left 01/06/13   benign finding of sclerosing adenosis   COLONOSCOPY WITH PROPOFOL N/A 11/29/2015   Procedure: COLONOSCOPY WITH PROPOFOL;  Surgeon: Jonathon Bellows, MD;  Location: [facility] ENDOSCOPY;  Service: Endoscopy;  Laterality: N/A;   KNEE SURGERY Left 2010   torn meniscus   TONSILECTOMY, ADENOIDECTOMY, BILATERAL MYRINGOTOMY AND TUBES      Social History   Socioeconomic History   Marital status: Divorced    Spouse name: Not on file   Number of children: Not on file   Years of education: Not on file   Highest  education level: Some college, no degree  Occupational History   Occupation: retired   Tobacco Use   Smoking status: Never   Smokeless tobacco: Never  Vaping Use   Vaping Use: Never used  Substance and Sexual Activity   Alcohol use: No   Drug use: No   Sexual activity: Not  Currently    Birth control/protection: None  Other Topics Concern   Not on file  Social History Narrative   Worked at the [employer].   Social Determinants of Health   Financial Resource Strain: Not on file  Food Insecurity: Not on file  Transportation Needs: Not on file  Physical Activity: Not on file  Stress: Not on file  Social Connections: Not on file  Intimate Partner Violence: Not on file    Family History  Problem Relation Age of Onset   Bladder Cancer Mother    Heart attack Father    Breast cancer Maternal Grandmother 80   Kidney disease Neg Hx      Current Outpatient Medications:    apixaban (ELIQUIS) 2.5 MG TABS tablet, Take 1 tablet (2.5 mg total) by mouth 2 (two) times daily., Disp: 60 tablet, Rfl: 3   alendronate (FOSAMAX) 70 MG tablet, Take 70 mg by mouth once a week. Take with a full glass of water on an empty stomach., Disp: , Rfl:    atorvastatin (LIPITOR) 20 MG tablet, TAKE 1 TABLET BY MOUTH EVERY DAY, Disp: 90 tablet, Rfl: 0   clonazePAM (KLONOPIN) 1 MG tablet, Take 1 mg by mouth daily as needed for anxiety., Disp: , Rfl:    FLUoxetine (PROZAC) 40 MG capsule, Take 40 mg by mouth daily. (Patient not taking: Reported on 01/12/2021), Disp: , Rfl:    folic acid (FOLVITE) 1 MG tablet, Take 1 tablet (1 mg total) by mouth daily., Disp: 30 tablet, Rfl: 2   furosemide (LASIX) 40 MG tablet, Take 40 mg by mouth daily., Disp: , Rfl:    polyethylene glycol powder (GLYCOLAX/MIRALAX) 17 GM/SCOOP powder, Use as directed, Disp: , Rfl:    QUEtiapine (SEROQUEL) 100 MG tablet, Take 1 tablet (100 mg total) by mouth at bedtime., Disp: 90 tablet, Rfl: 1   SUMAtriptan (IMITREX) 100 MG tablet, May repeat in 2 hours if headache persists or recurs., Disp: 9 tablet, Rfl: 5   vitamin B-12 100 MCG tablet, Take 1 tablet (100 mcg total) by mouth daily., Disp: 30 tablet, Rfl: 2  Physical exam: Blood pressure 132/74, heart rate 104, temperature 98.6 Physical Exam Constitutional:       General: She is not in acute distress.    Comments: Appears fatigued  Cardiovascular:     Rate and Rhythm: Normal rate and regular rhythm.     Heart sounds: Normal heart sounds.  Pulmonary:     Effort: Pulmonary effort is normal.     Breath sounds: Normal breath sounds.  Abdominal:     General: Bowel sounds are normal.     Palpations: Abdomen is soft.     Comments: Distended.  Ascites positive  Musculoskeletal:     Comments: Trace bilateral edema  Lymphadenopathy:     Comments: No palpable supraclavicular axillary or inguinal adenopathy  Skin:    General: Skin is warm and dry.  Neurological:     Mental Status: She is alert and oriented to person, place, and time.     CMP Latest Ref Rng & Units 01/03/2021  Glucose 70 - 99 mg/dL 115(H)  BUN 8 - 23 mg/dL 16  Creatinine 0.44 - 1.00 mg/dL 0.64  Sodium 135 - 145 mmol/L 132(L)  Potassium 3.5 - 5.1 mmol/L 3.9  Chloride 98 - 111 mmol/L 101  CO2 22 - 32 mmol/L 24  Calcium 8.9 - 10.3 mg/dL 7.9(L)  Total Protein 6.5 - 8.1 g/dL -  Total Bilirubin 0.3 - 1.2 mg/dL -  Alkaline Phos 38 - 126 U/L -  AST 15 - 41 U/L -  ALT 0 - 44 U/L -   CBC Latest Ref Rng & Units 01/02/2021  WBC 4.0 - 10.5 K/uL 7.8  Hemoglobin 12.0 - 15.0 g/dL 9.4(L)  Hematocrit 36.0 - 46.0 % 29.4(L)  Platelets 150 - 400 K/uL 471(H)    No images are attached to the encounter.  DG Chest 2 View  Result Date: 01/01/2021 CLINICAL DATA:  Shortness of breath. EXAM: CHEST - 2 VIEW COMPARISON:  PA Lat 12/30/2020. FINDINGS: The cardiac size is normal. There is mild aortic uncoiling, small amount of calcification. There are small pleural effusions collecting posteriorly, which was seen previously. The lungs are expiratory with very limited visualization of the lower lung fields. The visualized aerated lungs are clear. Osteopenia. IMPRESSION: Limited expiratory study. The lower lung fields are poorly evaluated. Visualized aerated lungs are clear. There are small pleural effusions.  Overall aeration seems unchanged. Electronically Signed   By: Telford Nab M.D.   On: 01/01/2021 02:50   DG Chest 2 View  Result Date: 12/30/2020 CLINICAL DATA:  Shortness of breath with exertion for the past 2 weeks. EXAM: CHEST - 2 VIEW COMPARISON:  None. FINDINGS: The heart size and mediastinal contours are within normal limits. Normal pulmonary vascularity. Low lung volumes. Trace bilateral pleural effusions. No consolidation or pneumothorax. No acute osseous abnormality. IMPRESSION: 1. Low lung volumes with trace bilateral pleural effusions. Electronically Signed   By: Titus Dubin M.D.   On: 12/30/2020 12:42   CT Angio Chest PE W and/or Wo Contrast  Result Date: 01/01/2021 CLINICAL DATA:  Acute, nonlocalized abdominal pain. Shortness of breath for 1 week. EXAM: CT ANGIOGRAPHY CHEST CT ABDOMEN AND PELVIS WITH CONTRAST TECHNIQUE: Multidetector CT imaging of the chest was performed using the standard protocol during bolus administration of intravenous contrast. Multiplanar CT image reconstructions and MIPs were obtained to evaluate the vascular anatomy. Multidetector CT imaging of the abdomen and pelvis was performed using the standard protocol during bolus administration of intravenous contrast. CONTRAST:  174m OMNIPAQUE IOHEXOL 350 MG/ML SOLN COMPARISON:  Abdominal CT 05/26/2015 FINDINGS: CTA CHEST FINDINGS Cardiovascular: Satisfactory opacification of the pulmonary arteries to the segmental level on second injection, first being severely degraded by bolus dispersion. No evidence of pulmonary embolism. Normal heart size. No pericardial effusion. Coronary atherosclerosis. Mediastinum/Nodes: No mass or adenopathy. Lungs/Pleura: Small pleural effusions on the right more than left with atelectasis. Musculoskeletal: No acute or aggressive finding Review of the MIP images confirms the above findings. CT ABDOMEN and PELVIS FINDINGS Hepatobiliary: Compressed liver due to the size of ascites.  Cholelithiasis without acute cholecystitis or ductal dilatation Pancreas: No acute finding.  No inflammation or mass seen Spleen: Negative Adrenals/Urinary Tract: Negative adrenals and kidneys. Largely collapsed bladder. Stomach/Bowel: No obstruction, inflammation, or visible mass Vascular/Lymphatic: Atheromatous calcification. Nodularity in the left groin which is likely tumor within the remnant inguinal canal. Reproductive: Asymmetric heterogeneous thickening of the left ovary measuring 3.3 cm. Other: Large volume ascites with tense abdomen. Omental caking especially below the transverse colon. Nodularity also seen in the pelvic peritoneal recesses. Anasarca. Musculoskeletal: No acute finding Review of  the MIP images confirms the above findings. IMPRESSION: 1. 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 A primary is not certain, but there is notable asymmetric thickening and heterogeneity of the left ovary (primary versus metastatic deposits). 2. Low volume chest with small pleural effusions and mild atelectasis. Negative for pulmonary embolism. Electronically Signed   By: Jorje Guild M.D.   On: 01/01/2021 10:04   CT ABDOMEN PELVIS W CONTRAST  Result Date: 01/01/2021 CLINICAL DATA:  Acute, nonlocalized abdominal pain. Shortness of breath for 1 week. EXAM: CT ANGIOGRAPHY CHEST CT ABDOMEN AND PELVIS WITH CONTRAST TECHNIQUE: Multidetector CT imaging of the chest was performed using the standard protocol during bolus administration of intravenous contrast. Multiplanar CT image reconstructions and MIPs were obtained to evaluate the vascular anatomy. Multidetector CT imaging of the abdomen and pelvis was performed using the standard protocol during bolus administration of intravenous contrast. CONTRAST:  149m OMNIPAQUE IOHEXOL 350 MG/ML SOLN COMPARISON:  Abdominal CT 05/26/2015 FINDINGS: CTA CHEST FINDINGS Cardiovascular: Satisfactory opacification of the pulmonary arteries to the  segmental level on second injection, first being severely degraded by bolus dispersion. No evidence of pulmonary embolism. Normal heart size. No pericardial effusion. Coronary atherosclerosis. Mediastinum/Nodes: No mass or adenopathy. Lungs/Pleura: Small pleural effusions on the right more than left with atelectasis. Musculoskeletal: No acute or aggressive finding Review of the MIP images confirms the above findings. CT ABDOMEN and PELVIS FINDINGS Hepatobiliary: Compressed liver due to the size of ascites. Cholelithiasis without acute cholecystitis or ductal dilatation Pancreas: No acute finding.  No inflammation or mass seen Spleen: Negative Adrenals/Urinary Tract: Negative adrenals and kidneys. Largely collapsed bladder. Stomach/Bowel: No obstruction, inflammation, or visible mass Vascular/Lymphatic: Atheromatous calcification. Nodularity in the left groin which is likely tumor within the remnant inguinal canal. Reproductive: Asymmetric heterogeneous thickening of the left ovary measuring 3.3 cm. Other: Large volume ascites with tense abdomen. Omental caking especially below the transverse colon. Nodularity also seen in the pelvic peritoneal recesses. Anasarca. Musculoskeletal: No acute finding Review of the MIP images confirms the above findings. IMPRESSION: 1. 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 A primary is not certain, but there is notable asymmetric thickening and heterogeneity of the left ovary (primary versus metastatic deposits). 2. Low volume chest with small pleural effusions and mild atelectasis. Negative for pulmonary embolism. Electronically Signed   By: JJorje GuildM.D.   On: 01/01/2021 10:04   UKoreaVenous Img Lower Bilateral (DVT)  Result Date: 01/02/2021 CLINICAL DATA:  Leg swelling.  Shortness of breath. EXAM: BILATERAL LOWER EXTREMITY VENOUS DOPPLER ULTRASOUND TECHNIQUE: Gray-scale sonography with graded compression, as well as color Doppler and duplex  ultrasound were performed to evaluate the lower extremity deep venous systems from the level of the common femoral vein and including the common femoral, femoral, profunda femoral, popliteal and calf veins including the posterior tibial, peroneal and gastrocnemius veins when visible. The superficial great saphenous vein was also interrogated. Spectral Doppler was utilized to evaluate flow at rest and with distal augmentation maneuvers in the common femoral, femoral and popliteal veins. COMPARISON:  None. FINDINGS: RIGHT LOWER EXTREMITY Common Femoral Vein: No evidence of thrombus. Normal compressibility, respiratory phasicity and response to augmentation. Saphenofemoral Junction: No evidence of thrombus. Normal compressibility and flow on color Doppler imaging. Profunda Femoral Vein: No evidence of thrombus. Normal compressibility and flow on color Doppler imaging. Femoral Vein: No evidence of thrombus. Normal compressibility, respiratory phasicity and response to augmentation. Popliteal Vein: No evidence of thrombus. Normal compressibility, respiratory phasicity and response  to augmentation. Calf Veins: No evidence of thrombus. Normal compressibility and flow on color Doppler imaging. Other Findings: Fluid collection in the right popliteal fossa that measures 4.2 x 0.5 x 2.3 cm. Findings are suggestive for a Baker's cyst. LEFT LOWER EXTREMITY Common Femoral Vein: No evidence of thrombus. Normal compressibility, respiratory phasicity and response to augmentation. Saphenofemoral Junction: No evidence of thrombus. Normal compressibility and flow on color Doppler imaging. Profunda Femoral Vein: No evidence of thrombus. Normal compressibility and flow on color Doppler imaging. Femoral Vein: No evidence of thrombus. Normal compressibility, respiratory phasicity and response to augmentation. Popliteal Vein: No evidence of thrombus. Normal compressibility, respiratory phasicity and response to augmentation. Calf Veins: No  evidence of thrombus. Normal compressibility and flow on color Doppler imaging. IMPRESSION: No evidence of deep venous thrombosis in either lower extremity. Right knee Baker's cyst. Electronically Signed   By: Markus Daft M.D.   On: 01/02/2021 10:00   US Paracentesis  Result Date: 01/01/2021 INDICATION: Patient with suspected peritoneal carcinomatosis presents today with large volume ascites. Interventional radiology asked to perform a diagnostic and therapeutic paracentesis. EXAM: ULTRASOUND GUIDED PARACENTESIS MEDICATIONS: 1% lidocaine 10 mL COMPLICATIONS: None immediate. PROCEDURE: Informed written consent was obtained from the patient after a discussion of the risks, benefits and alternatives to treatment. A timeout was performed prior to the initiation of the procedure. Initial ultrasound scanning demonstrates a large amount of ascites within the right lower abdominal quadrant. The right lower abdomen was prepped and draped in the usual sterile fashion. 1% lidocaine was used for local anesthesia. Following this, a 19 gauge, 10-cm, Yueh catheter was introduced. An ultrasound image was saved for documentation purposes. The paracentesis was performed. The catheter was removed and a dressing was applied. The patient tolerated the procedure well without immediate post procedural complication. Patient received post-procedure intravenous albumin; see nursing notes for details. FINDINGS: A total of approximately 12 L of amber-colored fluid was removed. Samples were sent to the laboratory as requested by the clinical team. IMPRESSION: Successful ultrasound-guided paracentesis yielding 12 liters of peritoneal fluid. Read by: Soyla Dryer, NP Electronically Signed   By: Albin Felling M.D.   On: 01/01/2021 15:31   DG Abd Portable 2 Views  Result Date: 01/01/2021 CLINICAL DATA:  Abdominal distension EXAM: PORTABLE ABDOMEN - 2 VIEW COMPARISON:  05/26/2015 FINDINGS: Gas and stool throughout the colon with  relative sparing of the descending colon, but no convincing obstruction. No concerning mass effect or calcification. Lumbar spine degeneration and mild scoliosis. IMPRESSION: Moderate distension of the colon by gas and stool Electronically Signed   By: Jorje Guild M.D.   On: 01/01/2021 04:03   ECHOCARDIOGRAM COMPLETE  Result Date: 01/03/2021    ECHOCARDIOGRAM REPORT   Patient Name:   ASHELEY HELLBERG Date of Exam: 01/02/2021 Medical Rec #:  [id number]         Height:       63.0 in Accession #:    [id number]        Weight:       177.2 lb Date of Birth:  05-24-1945          BSA:          1.837 m Patient Age:    3 years          BP:           125/53 mmHg Patient Gender: F                 HR:  88 bpm. Exam Location:  [facility] Procedure: 2D Echo Indications:     Dyspnea R06.00  History:         Patient has no prior history of Echocardiogram examinations.  Sonographer:     Kathlen Brunswick RDCS Referring Phys:  [id number] CHRISTOPHER P DANFORD Diagnosing Phys: Kate Sable MD IMPRESSIONS  1. Left ventricular ejection fraction, by estimation, is 50 to 55%. The left ventricle has low normal function. The left ventricle has no regional wall motion abnormalities. There is mild left ventricular hypertrophy of the basal-septal segment. Left ventricular diastolic parameters are consistent with Grade I diastolic dysfunction (impaired relaxation).  2. Right ventricular systolic function is normal. The right ventricular size is normal.  3. The mitral valve is normal in structure. No evidence of mitral valve regurgitation.  4. The aortic valve is tricuspid. Aortic valve regurgitation is not visualized. Aortic valve sclerosis/calcification is present, without any evidence of aortic stenosis. FINDINGS  Left Ventricle: Left ventricular ejection fraction, by estimation, is 50 to 55%. The left ventricle has low normal function. The left ventricle has no regional wall motion abnormalities. The left ventricular internal cavity  size was normal in size. There is mild left ventricular hypertrophy of the basal-septal segment. Left ventricular diastolic parameters are consistent with Grade I diastolic dysfunction (impaired relaxation). Right Ventricle: The right ventricular size is normal. No increase in right ventricular wall thickness. Right ventricular systolic function is normal. Left Atrium: Left atrial size was normal in size. Right Atrium: Right atrial size was normal in size. Pericardium: There is no evidence of pericardial effusion. Mitral Valve: The mitral valve is normal in structure. No evidence of mitral valve regurgitation. Tricuspid Valve: The tricuspid valve is not well visualized. Tricuspid valve regurgitation is mild. Aortic Valve: The aortic valve is tricuspid. Aortic valve regurgitation is not visualized. Aortic valve sclerosis/calcification is present, without any evidence of aortic stenosis. Aortic valve peak gradient measures 11.8 mmHg. Pulmonic Valve: The pulmonic valve was not well visualized. Pulmonic valve regurgitation is not visualized. Aorta: The aortic root and ascending aorta are structurally normal, with no evidence of dilitation. IAS/Shunts: No atrial level shunt detected by color flow Doppler.  LEFT VENTRICLE PLAX 2D LVIDd:         4.30 cm      Diastology LVIDs:         2.90 cm      LV e' medial:    6.74 cm/s LV PW:         1.20 cm      LV E/e' medial:  8.3 LV IVS:        1.00 cm      LV e' lateral:   5.66 cm/s LVOT diam:     1.80 cm      LV E/e' lateral: 9.8 LV SV:         40 LV SV Index:   22 LVOT Area:     2.54 cm  LV Volumes (MOD) LV vol d, MOD A2C: 73.4 ml LV vol d, MOD A4C: 126.0 ml LV vol s, MOD A2C: 38.6 ml LV vol s, MOD A4C: 47.7 ml LV SV MOD A2C:     34.8 ml LV SV MOD A4C:     126.0 ml LV SV MOD BP:      55.2 ml RIGHT VENTRICLE RV Basal diam:  2.70 cm RV S prime:     12.00 cm/s TAPSE (M-mode): 1.8 cm LEFT ATRIUM  Index        RIGHT ATRIUM           Index LA diam:        4.00 cm 2.18  cm/m   RA Area:     10.00 cm LA Vol (A2C):   42.7 ml 23.25 ml/m  RA Volume:   19.50 ml  10.62 ml/m LA Vol (A4C):   21.8 ml 11.87 ml/m LA Biplane Vol: 32.6 ml 17.75 ml/m  AORTIC VALVE                 PULMONIC VALVE AV Area (Vmax): 1.41 cm     PV Vmax:       1.07 m/s AV Vmax:        172.00 cm/s  PV Peak grad:  4.6 mmHg AV Peak Grad:   11.8 mmHg LVOT Vmax:      95.30 cm/s LVOT Vmean:     61.200 cm/s LVOT VTI:       0.156 m  AORTA Ao Root diam: 2.40 cm Ao Asc diam:  2.60 cm MITRAL VALVE               TRICUSPID VALVE MV Area (PHT): 3.40 cm    TV Peak grad:   25.7 mmHg MV Decel Time: 223 msec    TV Vmax:        2.53 m/s MV E velocity: 55.70 cm/s MV A velocity: 93.40 cm/s  SHUNTS MV E/A ratio:  0.60        Systemic VTI:  0.16 m                            Systemic Diam: 1.80 cm Kate Sable MD Electronically signed by Kate Sable MD Signature Date/Time: 01/03/2021/10:15:32 AM    Final      Assessment and plan- Patient is a 76 y.o. 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  Patient's performance status is borderline for upfront surgery.  At baseline patient lives alone and is independent of her ADLs.  I would recommend neoadjuvant chemotherapy with CarboTaxol given IV every 3 weeks carboplatin will be given at AUC 5 along with Taxol at 175 mg per metered square.  Given the presence of ascites I would also like to give her bevacizumab every 3 weeks.  Plan is to give her 3-4 cycles followed by repeat scans and assessment for surgery.  Discussed risks and benefits of chemotherapy including all but not limited to nausea, vomiting, low blood counts, risk of infections and hospitalizations.  Risk of thromboembolism hypertension proteinuria associated with bevacizumab.  Treatment will be given with a  curative intent.  Patient understands and agrees to proceed as planned.  Given the concern for inguinal lymph node involvement on CT scan she potentially has stage IV disease.  I will also plan to get a PET CT scan for further characterization of her lymph node Involvement.  Patient did have some baseline +1 proteinuria on her urinalysis and I will plan to check a 24-hour urine protein starting today.  Patient states that she had a superficial thrombophlebitis that was treated with Tylenol when she was pregnant but no definitive history of DVT.  However as per GYN oncology assessment her Bishop Limbo score is high and therefore thromboprophylaxis is indicated.  I will start her on Eliquis 2.5 mg twice daily after her port is inserted.  Patient has evidence of folate deficiency and will start folic acid 1 mg daily.  We will give her B12 injections along with chemotherapy including first dose today.  She does not have to take any oral B12 supplements at this time.  Although her ferritin is elevated her transferrin saturation is 20% and therefore I will plan to give her 4 doses of Venofer along with chemotherapy.  Patient will need chemo class and port placement and I will plan to tentatively start chemotherapy in 1 week's time   Cancer Staging  Malignant neoplasm of ovary [facility]) Staging form: Ovary, Fallopian Tube, and Primary Peritoneal Carcinoma, AJCC 8th Edition - Clinical stage from 01/13/2021: Stage IVB (cTX, cNX, cM1b) - Signed by Sindy Guadeloupe, MD on 01/13/2021     Visit Diagnosis 1. 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 Anemia due to vitamin B12 deficiency, unspecified B12 deficiency type   3. Peritoneal carcinomatosis [facility])      Dr. Randa Evens, MD, MPH [facility] [phone] 01/13/2021 12:15 PM

## 2021-01-14 ENCOUNTER — Encounter: Payer: Self-pay | Admitting: Oncology

## 2021-01-14 ENCOUNTER — Other Ambulatory Visit: Payer: Self-pay

## 2021-01-14 ENCOUNTER — Inpatient Hospital Stay: Payer: Medicare PPO

## 2021-01-14 DIAGNOSIS — C763 Malignant neoplasm of pelvis: Secondary | ICD-10-CM

## 2021-01-14 LAB — PROTEIN, URINE, 24 HOUR
Collection Interval-UPROT: 24 hours
Protein, 24H Urine: 126 mg/d — ABNORMAL HIGH (ref 50–100)
Protein, Urine: 9 mg/dL
Urine Total Volume-UPROT: 1400 mL

## 2021-01-15 ENCOUNTER — Encounter: Payer: Self-pay | Admitting: Oncology

## 2021-01-17 ENCOUNTER — Encounter: Payer: Self-pay | Admitting: Radiology

## 2021-01-17 ENCOUNTER — Other Ambulatory Visit: Payer: Self-pay | Admitting: *Deleted

## 2021-01-17 ENCOUNTER — Other Ambulatory Visit: Payer: Self-pay

## 2021-01-17 ENCOUNTER — Ambulatory Visit
Admission: RE | Admit: 2021-01-17 | Discharge: 2021-01-17 | Disposition: A | Discharge status: Home / Self Care | Payer: Medicare PPO | Source: Ambulatory Visit | Attending: Oncology | Admitting: Oncology

## 2021-01-17 ENCOUNTER — Encounter: Payer: Self-pay | Admitting: Oncology

## 2021-01-17 DIAGNOSIS — D519 Vitamin B12 deficiency anemia, unspecified: Secondary | ICD-10-CM | POA: Insufficient documentation

## 2021-01-17 DIAGNOSIS — R180 Malignant ascites: Secondary | ICD-10-CM | POA: Diagnosis not present

## 2021-01-17 DIAGNOSIS — C763 Malignant neoplasm of pelvis: Secondary | ICD-10-CM | POA: Diagnosis not present

## 2021-01-17 DIAGNOSIS — C569 Malignant neoplasm of unspecified ovary: Secondary | ICD-10-CM | POA: Diagnosis not present

## 2021-01-17 DIAGNOSIS — Z452 Encounter for adjustment and management of vascular access device: Secondary | ICD-10-CM | POA: Diagnosis not present

## 2021-01-17 DIAGNOSIS — C801 Malignant (primary) neoplasm, unspecified: Secondary | ICD-10-CM | POA: Diagnosis not present

## 2021-01-17 HISTORY — PX: IR PARACENTESIS: IMG2679

## 2021-01-17 HISTORY — PX: IR IMAGING GUIDED PORT INSERTION: IMG5740

## 2021-01-17 MED ORDER — HEPARIN SOD (PORK) LOCK FLUSH 100 UNIT/ML IV SOLN
INTRAVENOUS | Status: AC
  Administered 2021-01-17: 500 [IU]
  Filled 2021-01-17: qty 5

## 2021-01-17 MED ORDER — MIDAZOLAM HCL 2 MG/2ML IJ SOLN
INTRAMUSCULAR | Status: AC | PRN
  Administered 2021-01-17 (×2): 1 mg via INTRAVENOUS

## 2021-01-17 MED ORDER — LIDOCAINE-EPINEPHRINE 1 %-1:100000 IJ SOLN
INTRAMUSCULAR | Status: AC
  Filled 2021-01-17: qty 1

## 2021-01-17 MED ORDER — LIDOCAINE-EPINEPHRINE 1 %-1:100000 IJ SOLN
INTRAMUSCULAR | Status: AC
  Administered 2021-01-17: 20 mL
  Filled 2021-01-17: qty 1

## 2021-01-17 MED ORDER — MIDAZOLAM HCL 2 MG/2ML IJ SOLN
INTRAMUSCULAR | Status: AC
  Filled 2021-01-17: qty 2

## 2021-01-17 MED ORDER — FENTANYL CITRATE (PF) 100 MCG/2ML IJ SOLN
INTRAMUSCULAR | Status: AC | PRN
  Administered 2021-01-17 (×2): 50 ug via INTRAVENOUS

## 2021-01-17 MED ORDER — FENTANYL CITRATE (PF) 100 MCG/2ML IJ SOLN
INTRAMUSCULAR | Status: AC
  Filled 2021-01-17: qty 2

## 2021-01-17 NOTE — H&P (Signed)
Chief Complaint: Patient was seen in consultation today for high grade serous carcinoma at the request of [place] C  Referring Physician(s): Rao,Archana C  Supervising Physician: Juliet Rude  Patient Status: [place] - Out-pt  History of Present Illness: Kathy Morton is a 76 y.o. 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 The patient has been seen by Oncology, Dr. Janese Banks on 01/12/21 and request received for image guided port a catheter placement for chemotherapy and diagnostic and therapeutic paracentesis.   The patient has had a H&P performed within the last 30 days, all history, medications, and exam have been reviewed. The patient denies any interval changes since the H&P.  The patient denies any current chest pain or shortness of breath. The patient denies any recent infections, fever or chills. The patient denies any history of sleep apnea or chronic oxygen use. She has no known complications to sedation.    Past Medical History:  Diagnosis Date   Anxiety    Depression    DVT of leg (deep venous thrombosis) (HCC)    Dysuria    History of blood clots    Hypertension    Incontinence    Migraine    Recurrent UTI     Past Surgical History:  Procedure Laterality Date   BREAST BIOPSY Left 01/06/13   benign finding of sclerosing adenosis   COLONOSCOPY WITH PROPOFOL N/A 11/29/2015   Procedure: COLONOSCOPY WITH PROPOFOL;  Surgeon: Jonathon Bellows, MD;  Location: [facility] ENDOSCOPY;  Service: Endoscopy;  Laterality: N/A;   KNEE SURGERY Left 2010   torn meniscus   TONSILECTOMY, ADENOIDECTOMY, BILATERAL MYRINGOTOMY AND TUBES      Allergies: Patient has no known allergies.  Medications: Prior to Admission medications   Medication Sig Start Date End Date Taking? Authorizing Provider  alendronate (FOSAMAX) 70 MG tablet Take 70 mg by mouth once a week. Take with a full glass of water on an empty  stomach.   Yes [provider]  apixaban (ELIQUIS) 2.5 MG TABS tablet Take 1 tablet (2.5 mg total) by mouth 2 (two) times daily. 01/12/21  Yes Sindy Guadeloupe, MD  atorvastatin (LIPITOR) 20 MG tablet TAKE 1 TABLET BY MOUTH EVERY DAY 05/20/20  Yes Baity, Coralie Keens, NP  clonazePAM (KLONOPIN) 1 MG tablet Take 1 mg by mouth daily as needed for anxiety.   Yes [provider]  dexamethasone (DECADRON) 4 MG tablet Take 2 tablets (8 mg total) by mouth daily. Start the day after carboplatin chemotherapy for 3 days. 01/13/21  Yes Sindy Guadeloupe, MD  folic acid (FOLVITE) 1 MG tablet Take 1 tablet (1 mg total) by mouth daily. 01/04/21  Yes Fritzi Mandes, MD  furosemide (LASIX) 40 MG tablet Take 40 mg by mouth daily.   Yes [provider]  ondansetron (ZOFRAN) 8 MG tablet Take 1 tablet (8 mg total) by mouth 2 (two) times daily as needed for refractory nausea / vomiting. Start on day 3 after carboplatin chemo. 01/13/21  Yes Sindy Guadeloupe, MD  polyethylene glycol powder [facility]) 17 GM/SCOOP powder Use as directed 02/15/09  Yes [provider]  QUEtiapine (SEROQUEL) 100 MG tablet Take 1 tablet (100 mg total) by mouth at bedtime. 02/11/20  Yes Karamalegos, Devonne Doughty, DO  vitamin B-12 100 MCG tablet Take 1 tablet (100 mcg total) by mouth daily. 01/04/21  Yes Fritzi Mandes, MD  FLUoxetine (PROZAC) 40 MG capsule Take 40 mg by  mouth daily. Patient not taking: Reported on 01/12/2021    [provider]  lidocaine-prilocaine (EMLA) cream Apply to affected area once 01/13/21   Sindy Guadeloupe, MD  LORazepam (ATIVAN) 0.5 MG tablet Take 1 tablet (0.5 mg total) by mouth every 6 (six) hours as needed (Nausea or vomiting). 01/13/21   Sindy Guadeloupe, MD  prochlorperazine (COMPAZINE) 10 MG tablet Take 1 tablet (10 mg total) by mouth every 6 (six) hours as needed (Nausea or vomiting). 01/13/21   Sindy Guadeloupe, MD  SUMAtriptan (IMITREX) 100 MG tablet May repeat in 2 hours if headache persists or  recurs. 06/17/19   Malfi, Lupita Raider, FNP     Family History  Problem Relation Age of Onset   Bladder Cancer Mother    Heart attack Father    Breast cancer Maternal Grandmother 80   Kidney disease Neg Hx     Social History   Socioeconomic History   Marital status: Divorced    Spouse name: Not on file   Number of children: Not on file   Years of education: Not on file   Highest education level: Some college, no degree  Occupational History   Occupation: retired   Tobacco Use   Smoking status: Never   Smokeless tobacco: Never  Vaping Use   Vaping Use: Never used  Substance and Sexual Activity   Alcohol use: No   Drug use: No   Sexual activity: Not Currently    Birth control/protection: None  Other Topics Concern   Not on file  Social History Narrative   Worked at the [employer].   Social Determinants of Health   Financial Resource Strain: Not on file  Food Insecurity: Not on file  Transportation Needs: Not on file  Physical Activity: Not on file  Stress: Not on file  Social Connections: Not on file   Review of Systems: A 12 point ROS discussed and pertinent positives are indicated in the HPI above.  All other systems are negative.  Review of Systems  Vital Signs: BP 120/73 (BP Location: Left Arm)    Pulse 96    Temp 98.5 F (36.9 C) (Oral)    Resp (!) 22    Ht 5\' 3"  (1.6 m)    Wt 170 lb (77.1 kg)    SpO2 100%    BMI 30.11 kg/m   Physical Exam Constitutional:      Appearance: Normal appearance.  HENT:     Head: Normocephalic and atraumatic.  Cardiovascular:     Rate and Rhythm: Normal rate and regular rhythm.  Pulmonary:     Effort: Pulmonary effort is normal. No respiratory distress.  Skin:    General: Skin is warm and dry.  Neurological:     Mental Status: She is alert and oriented to person, place, and time.    Imaging: DG Chest 2 View  Result Date: 01/01/2021 CLINICAL DATA:  Shortness of breath. EXAM: CHEST - 2 VIEW COMPARISON:  PA Lat  12/30/2020. FINDINGS: The cardiac size is normal. There is mild aortic uncoiling, small amount of calcification. There are small pleural effusions collecting posteriorly, which was seen previously. The lungs are expiratory with very limited visualization of the lower lung fields. The visualized aerated lungs are clear. Osteopenia. IMPRESSION: Limited expiratory study. The lower lung fields are poorly evaluated. Visualized aerated lungs are clear. There are small pleural effusions. Overall aeration seems unchanged. Electronically Signed   By: Telford Nab M.D.   On: 01/01/2021 02:50  DG Chest 2 View  Result Date: 12/30/2020 CLINICAL DATA:  Shortness of breath with exertion for the past 2 weeks. EXAM: CHEST - 2 VIEW COMPARISON:  None. FINDINGS: The heart size and mediastinal contours are within normal limits. Normal pulmonary vascularity. Low lung volumes. Trace bilateral pleural effusions. No consolidation or pneumothorax. No acute osseous abnormality. IMPRESSION: 1. Low lung volumes with trace bilateral pleural effusions. Electronically Signed   By: Titus Dubin M.D.   On: 12/30/2020 12:42   CT Angio Chest PE W and/or Wo Contrast  Result Date: 01/01/2021 CLINICAL DATA:  Acute, nonlocalized abdominal pain. Shortness of breath for 1 week. EXAM: CT ANGIOGRAPHY CHEST CT ABDOMEN AND PELVIS WITH CONTRAST TECHNIQUE: Multidetector CT imaging of the chest was performed using the standard protocol during bolus administration of intravenous contrast. Multiplanar CT image reconstructions and MIPs were obtained to evaluate the vascular anatomy. Multidetector CT imaging of the abdomen and pelvis was performed using the standard protocol during bolus administration of intravenous contrast. CONTRAST:  165mL OMNIPAQUE IOHEXOL 350 MG/ML SOLN COMPARISON:  Abdominal CT 05/26/2015 FINDINGS: CTA CHEST FINDINGS Cardiovascular: Satisfactory opacification of the pulmonary arteries to the segmental level on second injection,  first being severely degraded by bolus dispersion. No evidence of pulmonary embolism. Normal heart size. No pericardial effusion. Coronary atherosclerosis. Mediastinum/Nodes: No mass or adenopathy. Lungs/Pleura: Small pleural effusions on the right more than left with atelectasis. Musculoskeletal: No acute or aggressive finding Review of the MIP images confirms the above findings. CT ABDOMEN and PELVIS FINDINGS Hepatobiliary: Compressed liver due to the size of ascites. Cholelithiasis without acute cholecystitis or ductal dilatation Pancreas: No acute finding.  No inflammation or mass seen Spleen: Negative Adrenals/Urinary Tract: Negative adrenals and kidneys. Largely collapsed bladder. Stomach/Bowel: No obstruction, inflammation, or visible mass Vascular/Lymphatic: Atheromatous calcification. Nodularity in the left groin which is likely tumor within the remnant inguinal canal. Reproductive: Asymmetric heterogeneous thickening of the left ovary measuring 3.3 cm. Other: Large volume ascites with tense abdomen. Omental caking especially below the transverse colon. Nodularity also seen in the pelvic peritoneal recesses. Anasarca. Musculoskeletal: No acute finding Review of the MIP images confirms the above findings. IMPRESSION: 1. 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 A primary is not certain, but there is notable asymmetric thickening and heterogeneity of the left ovary (primary versus metastatic deposits). 2. Low volume chest with small pleural effusions and mild atelectasis. Negative for pulmonary embolism. Electronically Signed   By: Jorje Guild M.D.   On: 01/01/2021 10:04   CT ABDOMEN PELVIS W CONTRAST  Result Date: 01/01/2021 CLINICAL DATA:  Acute, nonlocalized abdominal pain. Shortness of breath for 1 week. EXAM: CT ANGIOGRAPHY CHEST CT ABDOMEN AND PELVIS WITH CONTRAST TECHNIQUE: Multidetector CT imaging of the chest was performed using the standard protocol during bolus  administration of intravenous contrast. Multiplanar CT image reconstructions and MIPs were obtained to evaluate the vascular anatomy. Multidetector CT imaging of the abdomen and pelvis was performed using the standard protocol during bolus administration of intravenous contrast. CONTRAST:  161mL OMNIPAQUE IOHEXOL 350 MG/ML SOLN COMPARISON:  Abdominal CT 05/26/2015 FINDINGS: CTA CHEST FINDINGS Cardiovascular: Satisfactory opacification of the pulmonary arteries to the segmental level on second injection, first being severely degraded by bolus dispersion. No evidence of pulmonary embolism. Normal heart size. No pericardial effusion. Coronary atherosclerosis. Mediastinum/Nodes: No mass or adenopathy. Lungs/Pleura: Small pleural effusions on the right more than left with atelectasis. Musculoskeletal: No acute or aggressive finding Review of the MIP images confirms the above findings. CT ABDOMEN  and PELVIS FINDINGS Hepatobiliary: Compressed liver due to the size of ascites. Cholelithiasis without acute cholecystitis or ductal dilatation Pancreas: No acute finding.  No inflammation or mass seen Spleen: Negative Adrenals/Urinary Tract: Negative adrenals and kidneys. Largely collapsed bladder. Stomach/Bowel: No obstruction, inflammation, or visible mass Vascular/Lymphatic: Atheromatous calcification. Nodularity in the left groin which is likely tumor within the remnant inguinal canal. Reproductive: Asymmetric heterogeneous thickening of the left ovary measuring 3.3 cm. Other: Large volume ascites with tense abdomen. Omental caking especially below the transverse colon. Nodularity also seen in the pelvic peritoneal recesses. Anasarca. Musculoskeletal: No acute finding Review of the MIP images confirms the above findings. IMPRESSION: 1. 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 A primary is not certain, but there is notable asymmetric thickening and heterogeneity of the left ovary (primary versus  metastatic deposits). 2. Low volume chest with small pleural effusions and mild atelectasis. Negative for pulmonary embolism. Electronically Signed   By: Jorje Guild M.D.   On: 01/01/2021 10:04   US Venous Img Lower Bilateral (DVT)  Result Date: 01/02/2021 CLINICAL DATA:  Leg swelling.  Shortness of breath. EXAM: BILATERAL LOWER EXTREMITY VENOUS DOPPLER ULTRASOUND TECHNIQUE: Gray-scale sonography with graded compression, as well as color Doppler and duplex ultrasound were performed to evaluate the lower extremity deep venous systems from the level of the common femoral vein and including the common femoral, femoral, profunda femoral, popliteal and calf veins including the posterior tibial, peroneal and gastrocnemius veins when visible. The superficial great saphenous vein was also interrogated. Spectral Doppler was utilized to evaluate flow at rest and with distal augmentation maneuvers in the common femoral, femoral and popliteal veins. COMPARISON:  None. FINDINGS: RIGHT LOWER EXTREMITY Common Femoral Vein: No evidence of thrombus. Normal compressibility, respiratory phasicity and response to augmentation. Saphenofemoral Junction: No evidence of thrombus. Normal compressibility and flow on color Doppler imaging. Profunda Femoral Vein: No evidence of thrombus. Normal compressibility and flow on color Doppler imaging. Femoral Vein: No evidence of thrombus. Normal compressibility, respiratory phasicity and response to augmentation. Popliteal Vein: No evidence of thrombus. Normal compressibility, respiratory phasicity and response to augmentation. Calf Veins: No evidence of thrombus. Normal compressibility and flow on color Doppler imaging. Other Findings: Fluid collection in the right popliteal fossa that measures 4.2 x 0.5 x 2.3 cm. Findings are suggestive for a Baker's cyst. LEFT LOWER EXTREMITY Common Femoral Vein: No evidence of thrombus. Normal compressibility, respiratory phasicity and response to  augmentation. Saphenofemoral Junction: No evidence of thrombus. Normal compressibility and flow on color Doppler imaging. Profunda Femoral Vein: No evidence of thrombus. Normal compressibility and flow on color Doppler imaging. Femoral Vein: No evidence of thrombus. Normal compressibility, respiratory phasicity and response to augmentation. Popliteal Vein: No evidence of thrombus. Normal compressibility, respiratory phasicity and response to augmentation. Calf Veins: No evidence of thrombus. Normal compressibility and flow on color Doppler imaging. IMPRESSION: No evidence of deep venous thrombosis in either lower extremity. Right knee Baker's cyst. Electronically Signed   By: Markus Daft M.D.   On: 01/02/2021 10:00   US Paracentesis  Result Date: 01/01/2021 INDICATION: Patient with suspected peritoneal carcinomatosis presents today with large volume ascites. Interventional radiology asked to perform a diagnostic and therapeutic paracentesis. EXAM: ULTRASOUND GUIDED PARACENTESIS MEDICATIONS: 1% lidocaine 10 mL COMPLICATIONS: None immediate. PROCEDURE: Informed written consent was obtained from the patient after a discussion of the risks, benefits and alternatives to treatment. A timeout was performed prior to the initiation of the procedure. Initial ultrasound scanning demonstrates a large amount  of ascites within the right lower abdominal quadrant. The right lower abdomen was prepped and draped in the usual sterile fashion. 1% lidocaine was used for local anesthesia. Following this, a 19 gauge, 10-cm, Yueh catheter was introduced. An ultrasound image was saved for documentation purposes. The paracentesis was performed. The catheter was removed and a dressing was applied. The patient tolerated the procedure well without immediate post procedural complication. Patient received post-procedure intravenous albumin; see nursing notes for details. FINDINGS: A total of approximately 12 L of amber-colored fluid was  removed. Samples were sent to the laboratory as requested by the clinical team. IMPRESSION: Successful ultrasound-guided paracentesis yielding 12 liters of peritoneal fluid. Read by: Soyla Dryer, NP Electronically Signed   By: Albin Felling M.D.   On: 01/01/2021 15:31   DG Abd Portable 2 Views  Result Date: 01/01/2021 CLINICAL DATA:  Abdominal distension EXAM: PORTABLE ABDOMEN - 2 VIEW COMPARISON:  05/26/2015 FINDINGS: Gas and stool throughout the colon with relative sparing of the descending colon, but no convincing obstruction. No concerning mass effect or calcification. Lumbar spine degeneration and mild scoliosis. IMPRESSION: Moderate distension of the colon by gas and stool Electronically Signed   By: Jorje Guild M.D.   On: 01/01/2021 04:03   ECHOCARDIOGRAM COMPLETE  Result Date: 01/03/2021    ECHOCARDIOGRAM REPORT   Patient Name:   Kathy Morton Date of Exam: 01/02/2021 Medical Rec #:  [id number]         Height:       63.0 in Accession #:    [id number]        Weight:       177.2 lb Date of Birth:  1945-09-04          BSA:          1.837 m Patient Age:    31 years          BP:           125/53 mmHg Patient Gender: F                 HR:           88 bpm. Exam Location:  [facility] Procedure: 2D Echo Indications:     Dyspnea R06.00  History:         Patient has no prior history of Echocardiogram examinations.  Sonographer:     Kathlen Brunswick RDCS Referring Phys:  [id number] CHRISTOPHER P DANFORD Diagnosing Phys: Kate Sable MD IMPRESSIONS  1. Left ventricular ejection fraction, by estimation, is 50 to 55%. The left ventricle has low normal function. The left ventricle has no regional wall motion abnormalities. There is mild left ventricular hypertrophy of the basal-septal segment. Left ventricular diastolic parameters are consistent with Grade I diastolic dysfunction (impaired relaxation).  2. Right ventricular systolic function is normal. The right ventricular size is normal.  3. The mitral valve  is normal in structure. No evidence of mitral valve regurgitation.  4. The aortic valve is tricuspid. Aortic valve regurgitation is not visualized. Aortic valve sclerosis/calcification is present, without any evidence of aortic stenosis. FINDINGS  Left Ventricle: Left ventricular ejection fraction, by estimation, is 50 to 55%. The left ventricle has low normal function. The left ventricle has no regional wall motion abnormalities. The left ventricular internal cavity size was normal in size. There is mild left ventricular hypertrophy of the basal-septal segment. Left ventricular diastolic parameters are consistent with Grade I diastolic dysfunction (impaired relaxation). Right Ventricle: The right ventricular size is normal.  No increase in right ventricular wall thickness. Right ventricular systolic function is normal. Left Atrium: Left atrial size was normal in size. Right Atrium: Right atrial size was normal in size. Pericardium: There is no evidence of pericardial effusion. Mitral Valve: The mitral valve is normal in structure. No evidence of mitral valve regurgitation. Tricuspid Valve: The tricuspid valve is not well visualized. Tricuspid valve regurgitation is mild. Aortic Valve: The aortic valve is tricuspid. Aortic valve regurgitation is not visualized. Aortic valve sclerosis/calcification is present, without any evidence of aortic stenosis. Aortic valve peak gradient measures 11.8 mmHg. Pulmonic Valve: The pulmonic valve was not well visualized. Pulmonic valve regurgitation is not visualized. Aorta: The aortic root and ascending aorta are structurally normal, with no evidence of dilitation. IAS/Shunts: No atrial level shunt detected by color flow Doppler.  LEFT VENTRICLE PLAX 2D LVIDd:         4.30 cm      Diastology LVIDs:         2.90 cm      LV e' medial:    6.74 cm/s LV PW:         1.20 cm      LV E/e' medial:  8.3 LV IVS:        1.00 cm      LV e' lateral:   5.66 cm/s LVOT diam:     1.80 cm      LV E/e'  lateral: 9.8 LV SV:         40 LV SV Index:   22 LVOT Area:     2.54 cm  LV Volumes (MOD) LV vol d, MOD A2C: 73.4 ml LV vol d, MOD A4C: 126.0 ml LV vol s, MOD A2C: 38.6 ml LV vol s, MOD A4C: 47.7 ml LV SV MOD A2C:     34.8 ml LV SV MOD A4C:     126.0 ml LV SV MOD BP:      55.2 ml RIGHT VENTRICLE RV Basal diam:  2.70 cm RV S prime:     12.00 cm/s TAPSE (M-mode): 1.8 cm LEFT ATRIUM             Index        RIGHT ATRIUM           Index LA diam:        4.00 cm 2.18 cm/m   RA Area:     10.00 cm LA Vol (A2C):   42.7 ml 23.25 ml/m  RA Volume:   19.50 ml  10.62 ml/m LA Vol (A4C):   21.8 ml 11.87 ml/m LA Biplane Vol: 32.6 ml 17.75 ml/m  AORTIC VALVE                 PULMONIC VALVE AV Area (Vmax): 1.41 cm     PV Vmax:       1.07 m/s AV Vmax:        172.00 cm/s  PV Peak grad:  4.6 mmHg AV Peak Grad:   11.8 mmHg LVOT Vmax:      95.30 cm/s LVOT Vmean:     61.200 cm/s LVOT VTI:       0.156 m  AORTA Ao Root diam: 2.40 cm Ao Asc diam:  2.60 cm MITRAL VALVE               TRICUSPID VALVE MV Area (PHT): 3.40 cm    TV Peak grad:   25.7 mmHg MV Decel Time: 223 msec    TV Vmax:  2.53 m/s MV E velocity: 55.70 cm/s MV A velocity: 93.40 cm/s  SHUNTS MV E/A ratio:  0.60        Systemic VTI:  0.16 m                            Systemic Diam: 1.80 cm Kate Sable MD Electronically signed by Kate Sable MD Signature Date/Time: 01/03/2021/10:15:32 AM    Final     Labs:  CBC: Recent Labs    01/01/21 0208 01/02/21 0609  WBC 11.0* 7.8  HGB 10.8* 9.4*  HCT 28.8* 29.4*  PLT 556* 471*    COAGS: No results for input(s): INR, APTT in the last 8760 hours.  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 The patient has been seen by Oncology, Dr. Janese Banks on 01/12/21 and request received for image guided port a catheter placement for chemotherapy and diagnostic and therapeutic paracentesis.   The patient has been NPO, no blood thinners taken, labs and vitals have been reviewed.  Risks and benefits of image guided port-a-catheter placement was discussed with the patient including, but not limited to bleeding, infection, pneumothorax, or fibrin sheath development and need for additional procedures.  All of the patient's questions were answered, patient is agreeable to proceed. Consent signed and in chart.  Thank you for this interesting consult.  I greatly enjoyed meeting Kathy Morton and look forward to participating in their care.  A copy of this report was sent to the requesting provider on this date.  Electronically Signed: Hedy Jacob, PA-C 01/17/2021, 11:38 AM   I spent a total of 15 Minutes  in face to face in clinical consultation, greater than 50% of which was counseling/coordinating care for high grade serous carcinoma.

## 2021-01-17 NOTE — Procedures (Signed)
Interventional Radiology Procedure Note  Date of Procedure: 01/17/2021  Procedure: Right chest port placement, paracentesis   Findings:  1. US paracentesis, 9L clear ascites removed  2. Right chest port placement    Complications: No immediate complications noted.   Estimated Blood Loss: minimal  Follow-up and Recommendations: 1. Bedrest 1 hour    Albin Felling, MD  Vascular & Interventional Radiology  01/17/2021 12:35 PM

## 2021-01-17 NOTE — Progress Notes (Signed)
us

## 2021-01-17 NOTE — Progress Notes (Signed)
Patient clinically stable post Para/Port placement per Dr Denna Haggard, tolerated well. Removed 9 liters for paracentesis./tolerated well. Thereafter port placed with Versed 2 mg along with Fentanyl 100 mcg IV for procedure. Awake/alert and oriented post procedure. Report given to [facility] RN post procedure/specials at bedside. Denies complaints presently.

## 2021-01-18 ENCOUNTER — Encounter: Payer: Self-pay | Admitting: Oncology

## 2021-01-18 ENCOUNTER — Telehealth: Payer: Self-pay | Admitting: *Deleted

## 2021-01-18 ENCOUNTER — Inpatient Hospital Stay: Payer: Medicare PPO

## 2021-01-18 ENCOUNTER — Inpatient Hospital Stay (HOSPITAL_BASED_OUTPATIENT_CLINIC_OR_DEPARTMENT_OTHER): Payer: Medicare PPO | Admitting: Oncology

## 2021-01-18 VITALS — BP 140/75 | HR 91 | Temp 98.4°F | Resp 16 | Ht 63.0 in | Wt 159.1 lb

## 2021-01-18 VITALS — BP 120/59 | HR 95 | Temp 96.8°F | Resp 18

## 2021-01-18 DIAGNOSIS — D509 Iron deficiency anemia, unspecified: Secondary | ICD-10-CM | POA: Diagnosis not present

## 2021-01-18 DIAGNOSIS — C569 Malignant neoplasm of unspecified ovary: Secondary | ICD-10-CM

## 2021-01-18 DIAGNOSIS — C786 Secondary malignant neoplasm of retroperitoneum and peritoneum: Secondary | ICD-10-CM | POA: Diagnosis not present

## 2021-01-18 DIAGNOSIS — Z5112 Encounter for antineoplastic immunotherapy: Secondary | ICD-10-CM

## 2021-01-18 DIAGNOSIS — Z5111 Encounter for antineoplastic chemotherapy: Secondary | ICD-10-CM | POA: Diagnosis not present

## 2021-01-18 DIAGNOSIS — R188 Other ascites: Secondary | ICD-10-CM | POA: Diagnosis not present

## 2021-01-18 DIAGNOSIS — R634 Abnormal weight loss: Secondary | ICD-10-CM | POA: Diagnosis not present

## 2021-01-18 DIAGNOSIS — F419 Anxiety disorder, unspecified: Secondary | ICD-10-CM | POA: Diagnosis not present

## 2021-01-18 DIAGNOSIS — D508 Other iron deficiency anemias: Secondary | ICD-10-CM

## 2021-01-18 DIAGNOSIS — D519 Vitamin B12 deficiency anemia, unspecified: Secondary | ICD-10-CM | POA: Diagnosis not present

## 2021-01-18 LAB — CBC WITH DIFFERENTIAL/PLATELET
Abs Immature Granulocytes: 0.03 10*3/uL (ref 0.00–0.07)
Basophils Absolute: 0.1 10*3/uL (ref 0.0–0.1)
Basophils Relative: 1 %
Eosinophils Absolute: 0.2 10*3/uL (ref 0.0–0.5)
Eosinophils Relative: 3 %
HCT: 32.1 % — ABNORMAL LOW (ref 36.0–46.0)
Hemoglobin: 10.2 g/dL — ABNORMAL LOW (ref 12.0–15.0)
Immature Granulocytes: 0 %
Lymphocytes Relative: 15 %
Lymphs Abs: 1.2 10*3/uL (ref 0.7–4.0)
MCH: 27.3 pg (ref 26.0–34.0)
MCHC: 31.8 g/dL (ref 30.0–36.0)
MCV: 86.1 fL (ref 80.0–100.0)
Monocytes Absolute: 0.9 10*3/uL (ref 0.1–1.0)
Monocytes Relative: 10 %
Neutro Abs: 5.8 10*3/uL (ref 1.7–7.7)
Neutrophils Relative %: 71 %
Platelets: 565 10*3/uL — ABNORMAL HIGH (ref 150–400)
RBC: 3.73 MIL/uL — ABNORMAL LOW (ref 3.87–5.11)
RDW: 16.1 % — ABNORMAL HIGH (ref 11.5–15.5)
WBC: 8.2 10*3/uL (ref 4.0–10.5)
nRBC: 0 % (ref 0.0–0.2)

## 2021-01-18 LAB — COMPREHENSIVE METABOLIC PANEL
ALT: 15 U/L (ref 0–44)
AST: 23 U/L (ref 15–41)
Albumin: 2.7 g/dL — ABNORMAL LOW (ref 3.5–5.0)
Alkaline Phosphatase: 66 U/L (ref 38–126)
Anion gap: 7 (ref 5–15)
BUN: 17 mg/dL (ref 8–23)
CO2: 26 mmol/L (ref 22–32)
Calcium: 8.2 mg/dL — ABNORMAL LOW (ref 8.9–10.3)
Chloride: 101 mmol/L (ref 98–111)
Creatinine, Ser: 0.47 mg/dL (ref 0.44–1.00)
GFR, Estimated: >60 mL/min (ref >60)
Glucose, Bld: 112 mg/dL — ABNORMAL HIGH (ref 70–99)
Potassium: 3.5 mmol/L (ref 3.5–5.1)
Sodium: 134 mmol/L — ABNORMAL LOW (ref 135–145)
Total Bilirubin: 0.4 mg/dL (ref 0.3–1.2)
Total Protein: 5.8 g/dL — ABNORMAL LOW (ref 6.5–8.1)

## 2021-01-18 LAB — PROTEIN, URINE, RANDOM: Total Protein, Urine: 7 mg/dL

## 2021-01-18 MED ORDER — SODIUM CHLORIDE 0.9 % IV SOLN
175.0000 mg/m2 | Freq: Once | INTRAVENOUS | Status: AC
  Administered 2021-01-18: 312 mg via INTRAVENOUS
  Filled 2021-01-18: qty 52

## 2021-01-18 MED ORDER — SODIUM CHLORIDE 0.9 % IV SOLN: 15.0000 mg/kg | Freq: Once | INTRAVENOUS | Status: DC

## 2021-01-18 MED ORDER — HEPARIN SOD (PORK) LOCK FLUSH 100 UNIT/ML IV SOLN
500.0000 [IU] | Freq: Once | INTRAVENOUS | Status: AC | PRN
  Filled 2021-01-18: qty 5

## 2021-01-18 MED ORDER — HEPARIN SOD (PORK) LOCK FLUSH 100 UNIT/ML IV SOLN
INTRAVENOUS | Status: AC
  Administered 2021-01-18: 500 [IU]
  Filled 2021-01-18: qty 5

## 2021-01-18 MED ORDER — FAMOTIDINE IN NACL 20-0.9 MG/50ML-% IV SOLN
20.0000 mg | Freq: Once | INTRAVENOUS | Status: AC
  Administered 2021-01-18: 20 mg via INTRAVENOUS
  Filled 2021-01-18: qty 50

## 2021-01-18 MED ORDER — SODIUM CHLORIDE 0.9 % IV SOLN: 416.5000 mg | Freq: Once | INTRAVENOUS | Status: DC

## 2021-01-18 MED ORDER — SODIUM CHLORIDE 0.9 % IV SOLN: 175.0000 mg/m2 | Freq: Once | INTRAVENOUS | Status: DC

## 2021-01-18 MED ORDER — CYANOCOBALAMIN 1000 MCG/ML IJ SOLN
1000.0000 ug | INTRAMUSCULAR | Status: DC
  Administered 2021-01-18: 1000 ug via INTRAMUSCULAR
  Filled 2021-01-18: qty 1

## 2021-01-18 MED ORDER — SODIUM CHLORIDE 0.9 % IV SOLN
398.0000 mg | Freq: Once | INTRAVENOUS | Status: AC
  Administered 2021-01-18: 400 mg via INTRAVENOUS
  Filled 2021-01-18: qty 40

## 2021-01-18 MED ORDER — DIPHENHYDRAMINE HCL 50 MG/ML IJ SOLN
50.0000 mg | Freq: Once | INTRAMUSCULAR | Status: AC
  Administered 2021-01-18: 50 mg via INTRAVENOUS
  Filled 2021-01-18: qty 1

## 2021-01-18 MED ORDER — PALONOSETRON HCL INJECTION 0.25 MG/5ML
0.2500 mg | Freq: Once | INTRAVENOUS | Status: AC
  Administered 2021-01-18: 0.25 mg via INTRAVENOUS
  Filled 2021-01-18: qty 5

## 2021-01-18 MED ORDER — SODIUM CHLORIDE 0.9 % IV SOLN
150.0000 mg | Freq: Once | INTRAVENOUS | Status: AC
  Administered 2021-01-18: 150 mg via INTRAVENOUS
  Filled 2021-01-18: qty 150

## 2021-01-18 MED ORDER — ACETAMINOPHEN 325 MG PO TABS
650.0000 mg | ORAL_TABLET | Freq: Once | ORAL | Status: AC
  Administered 2021-01-18: 650 mg via ORAL
  Filled 2021-01-18: qty 2

## 2021-01-18 MED ORDER — SODIUM CHLORIDE 0.9 % IV SOLN
Freq: Once | INTRAVENOUS | Status: AC
  Filled 2021-01-18: qty 250

## 2021-01-18 MED ORDER — SODIUM CHLORIDE 0.9 % IV SOLN
10.0000 mg | Freq: Once | INTRAVENOUS | Status: AC
  Administered 2021-01-18: 10 mg via INTRAVENOUS
  Filled 2021-01-18: qty 10

## 2021-01-18 NOTE — Progress Notes (Signed)
Port placed yesterday - verified with MD about giving mvasi today.  Will not give today and push out a week giving.  Then reschedule cycle 2 4 weeks from now

## 2021-01-18 NOTE — Progress Notes (Signed)
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  She had CT angio chest which did not show any evidence of PE.  Low volume chest with small pleural effusions.  CT abdomen and pelvis with contrast showed tense ascites with features of underlying peritoneal carcinomatosis.  Asymmetric thickening and heterogeneity of the left ovary raising the concern for potential ovarian cancer.  Nodularity in the left groin which is likely tumor within the remnant inguinal canal.   Cytology consistent with high-grade serous carcinoma.The carcinoma is positive for PAX-8 and WT-1. Rare cells demonstrate  weak ER positivity. The cytomorphologic findings in conjunction with the  pattern of immunoreactivity are consistent with the above diagnosis. CA125 elevated at 2336.  Interval history-patient had another paracentesis done last week and 9.4 L of fluid was drained.  Presently patient reports some fatigue but denies other complaints at this time.  She had her port placed yesterday.  Reports feelings of anxiety for which she has been using as needed Ativan  ECOG PS- 2 Pain scale- 0   Review of systems- Review of Systems  Constitutional:  Positive for malaise/fatigue. Negative for chills, fever and weight loss.  HENT:   Negative for congestion, ear discharge and nosebleeds.   Eyes:  Negative for blurred vision.  Respiratory:  Negative for cough, hemoptysis, sputum production, shortness of breath and wheezing.   Cardiovascular:  Negative for chest pain, palpitations, orthopnea and claudication.  Gastrointestinal:  Negative for abdominal pain, blood in stool, constipation, diarrhea, heartburn, melena, nausea and vomiting.  Genitourinary:  Negative for dysuria, flank pain, frequency, hematuria and urgency.  Musculoskeletal:  Negative for back pain, joint pain and myalgias.  Skin:  Negative for rash.  Neurological:  Negative for dizziness, tingling, focal weakness, seizures, weakness and headaches.  Endo/Heme/Allergies:  Does not bruise/bleed easily.  Psychiatric/Behavioral:  Negative for depression and suicidal ideas. The patient is nervous/anxious. The patient does not have insomnia.       No Known Allergies   Past Medical History:  Diagnosis Date   Anxiety    Depression    DVT of leg (deep venous thrombosis) (HCC)    Dysuria    History of blood clots    Hypertension    Incontinence    Migraine    Recurrent UTI      Past Surgical History:  Procedure Laterality Date   BREAST BIOPSY Left 01/06/13   benign finding of sclerosing adenosis   COLONOSCOPY WITH PROPOFOL N/A 11/29/2015   Procedure: COLONOSCOPY WITH PROPOFOL;  Surgeon: Jonathon Bellows, MD;  Location: [facility] ENDOSCOPY;  Service: Endoscopy;  Laterality: N/A;   IR IMAGING GUIDED PORT INSERTION  01/17/2021   IR PARACENTESIS  01/17/2021   KNEE SURGERY Left 2010   torn meniscus   TONSILECTOMY, ADENOIDECTOMY, BILATERAL MYRINGOTOMY AND TUBES      Social History   Socioeconomic History   Marital  status: Divorced    Spouse name: Not on file   Number of children: Not on file   Years of education: Not on file   Highest education level: Some college, no degree  Occupational History   Occupation: retired   Tobacco Use   Smoking status: Never    Smokeless tobacco: Never  Vaping Use   Vaping Use: Never used  Substance and Sexual Activity   Alcohol use: No   Drug use: No   Sexual activity: Not Currently    Birth control/protection: None  Other Topics Concern   Not on file  Social History Narrative   Worked at the [employer].   Social Determinants of Health   Financial Resource Strain: Not on file  Food Insecurity: Not on file  Transportation Needs: Not on file  Physical Activity: Not on file  Stress: Not on file  Social Connections: Not on file  Intimate Partner Violence: Not on file    Family History  Problem Relation Age of Onset   Bladder Cancer Mother    Heart attack Father    Breast cancer Maternal Grandmother 80   Kidney disease Neg Hx      Current Outpatient Medications:    alendronate (FOSAMAX) 70 MG tablet, Take 70 mg by mouth once a week. Take with a full glass of water on an empty stomach., Disp: , Rfl:    apixaban (ELIQUIS) 2.5 MG TABS tablet, Take 1 tablet (2.5 mg total) by mouth 2 (two) times daily., Disp: 60 tablet, Rfl: 3   atorvastatin (LIPITOR) 20 MG tablet, TAKE 1 TABLET BY MOUTH EVERY DAY, Disp: 90 tablet, Rfl: 0   clonazePAM (KLONOPIN) 1 MG tablet, Take 1 mg by mouth daily as needed for anxiety., Disp: , Rfl:    dexamethasone (DECADRON) 4 MG tablet, Take 2 tablets (8 mg total) by mouth daily. Start the day after carboplatin chemotherapy for 3 days., Disp: 30 tablet, Rfl: 1   FLUoxetine (PROZAC) 40 MG capsule, Take 40 mg by mouth daily. (Patient not taking: Reported on 01/12/2021), Disp: , Rfl:    folic acid (FOLVITE) 1 MG tablet, Take 1 tablet (1 mg total) by mouth daily., Disp: 30 tablet, Rfl: 2   furosemide (LASIX) 40 MG tablet, Take 40 mg by mouth daily., Disp: , Rfl:    lidocaine-prilocaine (EMLA) cream, Apply to affected area once, Disp: 30 g, Rfl: 3   LORazepam (ATIVAN) 0.5 MG tablet, Take 1 tablet (0.5 mg total) by mouth every 6 (six) hours as needed (Nausea or vomiting)., Disp: 30  tablet, Rfl: 0   ondansetron (ZOFRAN) 8 MG tablet, Take 1 tablet (8 mg total) by mouth 2 (two) times daily as needed for refractory nausea / vomiting. Start on day 3 after carboplatin chemo., Disp: 30 tablet, Rfl: 1   polyethylene glycol powder (GLYCOLAX/MIRALAX) 17 GM/SCOOP powder, Use as directed, Disp: , Rfl:    prochlorperazine (COMPAZINE) 10 MG tablet, Take 1 tablet (10 mg total) by mouth every 6 (six) hours as needed (Nausea or vomiting)., Disp: 30 tablet, Rfl: 1   QUEtiapine (SEROQUEL) 100 MG tablet, Take 1 tablet (100 mg total) by mouth at bedtime., Disp: 90 tablet, Rfl: 1   SUMAtriptan (IMITREX) 100 MG tablet, May repeat in 2 hours if headache persists or recurs., Disp: 9 tablet, Rfl: 5   vitamin B-12 100 MCG tablet, Take 1 tablet (100 mcg total) by mouth daily., Disp: 30 tablet, Rfl: 2  Physical exam:  Vitals:   01/18/21 0844  BP:  140/75  Pulse: 91  Resp: 16  Temp: 98.4 F (36.9 C)  TempSrc: Oral  Weight: 159 lb 1.6 oz (72.2 kg)  Height: '5\' 3"'  (1.6 m)   Physical Exam Constitutional:      General: She is not in acute distress. Cardiovascular:     Rate and Rhythm: Normal rate and regular rhythm.     Heart sounds: Normal heart sounds.  Pulmonary:     Effort: Pulmonary effort is normal.     Breath sounds: Normal breath sounds.  Abdominal:     General: Bowel sounds are normal.     Palpations: Abdomen is soft.  Skin:    General: Skin is warm and dry.  Neurological:     Mental Status: She is alert and oriented to person, place, and time.     CMP Latest Ref Rng & Units 01/18/2021  Glucose 70 - 99 mg/dL 112(H)  BUN 8 - 23 mg/dL 17  Creatinine 0.44 - 1.00 mg/dL 0.47  Sodium 135 - 145 mmol/L 134(L)  Potassium 3.5 - 5.1 mmol/L 3.5  Chloride 98 - 111 mmol/L 101  CO2 22 - 32 mmol/L 26  Calcium 8.9 - 10.3 mg/dL 8.2(L)  Total Protein 6.5 - 8.1 g/dL 5.8(L)  Total Bilirubin 0.3 - 1.2 mg/dL 0.4  Alkaline Phos 38 - 126 U/L 66  AST 15 - 41 U/L 23  ALT 0 - 44 U/L 15   CBC  Latest Ref Rng & Units 01/18/2021  WBC 4.0 - 10.5 K/uL 8.2  Hemoglobin 12.0 - 15.0 g/dL 10.2(L)  Hematocrit 36.0 - 46.0 % 32.1(L)  Platelets 150 - 400 K/uL 565(H)    No images are attached to the encounter.  DG Chest 2 View  Result Date: 01/01/2021 CLINICAL DATA:  Shortness of breath. EXAM: CHEST - 2 VIEW COMPARISON:  PA Lat 12/30/2020. FINDINGS: The cardiac size is normal. There is mild aortic uncoiling, small amount of calcification. There are small pleural effusions collecting posteriorly, which was seen previously. The lungs are expiratory with very limited visualization of the lower lung fields. The visualized aerated lungs are clear. Osteopenia. IMPRESSION: Limited expiratory study. The lower lung fields are poorly evaluated. Visualized aerated lungs are clear. There are small pleural effusions. Overall aeration seems unchanged. Electronically Signed   By: Telford Nab M.D.   On: 01/01/2021 02:50   DG Chest 2 View  Result Date: 12/30/2020 CLINICAL DATA:  Shortness of breath with exertion for the past 2 weeks. EXAM: CHEST - 2 VIEW COMPARISON:  None. FINDINGS: The heart size and mediastinal contours are within normal limits. Normal pulmonary vascularity. Low lung volumes. Trace bilateral pleural effusions. No consolidation or pneumothorax. No acute osseous abnormality. IMPRESSION: 1. Low lung volumes with trace bilateral pleural effusions. Electronically Signed   By: Titus Dubin M.D.   On: 12/30/2020 12:42   CT Angio Chest PE W and/or Wo Contrast  Result Date: 01/01/2021 CLINICAL DATA:  Acute, nonlocalized abdominal pain. Shortness of breath for 1 week. EXAM: CT ANGIOGRAPHY CHEST CT ABDOMEN AND PELVIS WITH CONTRAST TECHNIQUE: Multidetector CT imaging of the chest was performed using the standard protocol during bolus administration of intravenous contrast. Multiplanar CT image reconstructions and MIPs were obtained to evaluate the vascular anatomy. Multidetector CT imaging of the  abdomen and pelvis was performed using the standard protocol during bolus administration of intravenous contrast. CONTRAST:  139m OMNIPAQUE IOHEXOL 350 MG/ML SOLN COMPARISON:  Abdominal CT 05/26/2015 FINDINGS: CTA CHEST FINDINGS Cardiovascular: Satisfactory opacification of the pulmonary arteries to the  segmental level on second injection, first being severely degraded by bolus dispersion. No evidence of pulmonary embolism. Normal heart size. No pericardial effusion. Coronary atherosclerosis. Mediastinum/Nodes: No mass or adenopathy. Lungs/Pleura: Small pleural effusions on the right more than left with atelectasis. Musculoskeletal: No acute or aggressive finding Review of the MIP images confirms the above findings. CT ABDOMEN and PELVIS FINDINGS Hepatobiliary: Compressed liver due to the size of ascites. Cholelithiasis without acute cholecystitis or ductal dilatation Pancreas: No acute finding.  No inflammation or mass seen Spleen: Negative Adrenals/Urinary Tract: Negative adrenals and kidneys. Largely collapsed bladder. Stomach/Bowel: No obstruction, inflammation, or visible mass Vascular/Lymphatic: Atheromatous calcification. Nodularity in the left groin which is likely tumor within the remnant inguinal canal. Reproductive: Asymmetric heterogeneous thickening of the left ovary measuring 3.3 cm. Other: Large volume ascites with tense abdomen. Omental caking especially below the transverse colon. Nodularity also seen in the pelvic peritoneal recesses. Anasarca. Musculoskeletal: No acute finding Review of the MIP images confirms the above findings. IMPRESSION: 1. 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 A primary is not certain, but there is notable asymmetric thickening and heterogeneity of the left ovary (primary versus metastatic deposits). 2. Low volume chest with small pleural effusions and mild atelectasis. Negative for pulmonary embolism. Electronically Signed   By: Jorje Guild M.D.   On: 01/01/2021 10:04   CT ABDOMEN PELVIS W CONTRAST  Result Date: 01/01/2021 CLINICAL DATA:  Acute, nonlocalized abdominal pain. Shortness of breath for 1 week. EXAM: CT ANGIOGRAPHY CHEST CT ABDOMEN AND PELVIS WITH CONTRAST TECHNIQUE: Multidetector CT imaging of the chest was performed using the standard protocol during bolus administration of intravenous contrast. Multiplanar CT image reconstructions and MIPs were obtained to evaluate the vascular anatomy. Multidetector CT imaging of the abdomen and pelvis was performed using the standard protocol during bolus administration of intravenous contrast. CONTRAST:  168m OMNIPAQUE IOHEXOL 350 MG/ML SOLN COMPARISON:  Abdominal CT 05/26/2015 FINDINGS: CTA CHEST FINDINGS Cardiovascular: Satisfactory opacification of the pulmonary arteries to the segmental level on second injection, first being severely degraded by bolus dispersion. No evidence of pulmonary embolism. Normal heart size. No pericardial effusion. Coronary atherosclerosis. Mediastinum/Nodes: No mass or adenopathy. Lungs/Pleura: Small pleural effusions on the right more than left with atelectasis. Musculoskeletal: No acute or aggressive finding Review of the MIP images confirms the above findings. CT ABDOMEN and PELVIS FINDINGS Hepatobiliary: Compressed liver due to the size of ascites. Cholelithiasis without acute cholecystitis or ductal dilatation Pancreas: No acute finding.  No inflammation or mass seen Spleen: Negative Adrenals/Urinary Tract: Negative adrenals and kidneys. Largely collapsed bladder. Stomach/Bowel: No obstruction, inflammation, or visible mass Vascular/Lymphatic: Atheromatous calcification. Nodularity in the left groin which is likely tumor within the remnant inguinal canal. Reproductive: Asymmetric heterogeneous thickening of the left ovary measuring 3.3 cm. Other: Large volume ascites with tense abdomen. Omental caking especially below the transverse colon. Nodularity also  seen in the pelvic peritoneal recesses. Anasarca. Musculoskeletal: No acute finding Review of the MIP images confirms the above findings. IMPRESSION: 1. 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 A primary is not certain, but there is notable asymmetric thickening and heterogeneity of the left ovary (primary versus metastatic deposits). 2. Low volume chest with small pleural effusions and mild atelectasis. Negative for pulmonary embolism. Electronically Signed   By: JJorje GuildM.D.   On: 01/01/2021 10:04   [place]Venous Img Lower Bilateral (DVT)  Result Date: 01/02/2021 CLINICAL DATA:  Leg swelling.  Shortness of breath. EXAM: BILATERAL LOWER EXTREMITY VENOUS  DOPPLER ULTRASOUND TECHNIQUE: Gray-scale sonography with graded compression, as well as color Doppler and duplex ultrasound were performed to evaluate the lower extremity deep venous systems from the level of the common femoral vein and including the common femoral, femoral, profunda femoral, popliteal and calf veins including the posterior tibial, peroneal and gastrocnemius veins when visible. The superficial great saphenous vein was also interrogated. Spectral Doppler was utilized to evaluate flow at rest and with distal augmentation maneuvers in the common femoral, femoral and popliteal veins. COMPARISON:  None. FINDINGS: RIGHT LOWER EXTREMITY Common Femoral Vein: No evidence of thrombus. Normal compressibility, respiratory phasicity and response to augmentation. Saphenofemoral Junction: No evidence of thrombus. Normal compressibility and flow on color Doppler imaging. Profunda Femoral Vein: No evidence of thrombus. Normal compressibility and flow on color Doppler imaging. Femoral Vein: No evidence of thrombus. Normal compressibility, respiratory phasicity and response to augmentation. Popliteal Vein: No evidence of thrombus. Normal compressibility, respiratory phasicity and response to augmentation. Calf Veins: No evidence of  thrombus. Normal compressibility and flow on color Doppler imaging. Other Findings: Fluid collection in the right popliteal fossa that measures 4.2 x 0.5 x 2.3 cm. Findings are suggestive for a Baker's cyst. LEFT LOWER EXTREMITY Common Femoral Vein: No evidence of thrombus. Normal compressibility, respiratory phasicity and response to augmentation. Saphenofemoral Junction: No evidence of thrombus. Normal compressibility and flow on color Doppler imaging. Profunda Femoral Vein: No evidence of thrombus. Normal compressibility and flow on color Doppler imaging. Femoral Vein: No evidence of thrombus. Normal compressibility, respiratory phasicity and response to augmentation. Popliteal Vein: No evidence of thrombus. Normal compressibility, respiratory phasicity and response to augmentation. Calf Veins: No evidence of thrombus. Normal compressibility and flow on color Doppler imaging. IMPRESSION: No evidence of deep venous thrombosis in either lower extremity. Right knee Baker's cyst. Electronically Signed   By: Markus Daft M.D.   On: 01/02/2021 10:00   US Paracentesis  Result Date: 01/01/2021 INDICATION: Patient with suspected peritoneal carcinomatosis presents today with large volume ascites. Interventional radiology asked to perform a diagnostic and therapeutic paracentesis. EXAM: ULTRASOUND GUIDED PARACENTESIS MEDICATIONS: 1% lidocaine 10 mL COMPLICATIONS: None immediate. PROCEDURE: Informed written consent was obtained from the patient after a discussion of the risks, benefits and alternatives to treatment. A timeout was performed prior to the initiation of the procedure. Initial ultrasound scanning demonstrates a large amount of ascites within the right lower abdominal quadrant. The right lower abdomen was prepped and draped in the usual sterile fashion. 1% lidocaine was used for local anesthesia. Following this, a 19 gauge, 10-cm, Yueh catheter was introduced. An ultrasound image was saved for documentation  purposes. The paracentesis was performed. The catheter was removed and a dressing was applied. The patient tolerated the procedure well without immediate post procedural complication. Patient received post-procedure intravenous albumin; see nursing notes for details. FINDINGS: A total of approximately 12 L of amber-colored fluid was removed. Samples were sent to the laboratory as requested by the clinical team. IMPRESSION: Successful ultrasound-guided paracentesis yielding 12 liters of peritoneal fluid. Read by: Soyla Dryer, NP Electronically Signed   By: Albin Felling M.D.   On: 01/01/2021 15:31   DG Abd Portable 2 Views  Result Date: 01/01/2021 CLINICAL DATA:  Abdominal distension EXAM: PORTABLE ABDOMEN - 2 VIEW COMPARISON:  05/26/2015 FINDINGS: Gas and stool throughout the colon with relative sparing of the descending colon, but no convincing obstruction. No concerning mass effect or calcification. Lumbar spine degeneration and mild scoliosis. IMPRESSION: Moderate distension of the colon by gas and  stool Electronically Signed   By: Jorje Guild M.D.   On: 01/01/2021 04:03   ECHOCARDIOGRAM COMPLETE  Result Date: 01/03/2021    ECHOCARDIOGRAM REPORT   Patient Name:   THURMA PRIEGO Date of Exam: 01/02/2021 Medical Rec #:  [id number]         Height:       63.0 in Accession #:    [id number]        Weight:       177.2 lb Date of Birth:  [birth year]          BSA:          1.837 m Patient Age:    4 years          BP:           125/53 mmHg Patient Gender: F                 HR:           88 bpm. Exam Location:  [facility] Procedure: 2D Echo Indications:     Dyspnea R06.00  History:         Patient has no prior history of Echocardiogram examinations.  Sonographer:     Kathlen Brunswick RDCS Referring Phys:  [id number] CHRISTOPHER P DANFORD Diagnosing Phys: Kate Sable MD IMPRESSIONS  1. Left ventricular ejection fraction, by estimation, is 50 to 55%. The left ventricle has low normal function. The left ventricle  has no regional wall motion abnormalities. There is mild left ventricular hypertrophy of the basal-septal segment. Left ventricular diastolic parameters are consistent with Grade I diastolic dysfunction (impaired relaxation).  2. Right ventricular systolic function is normal. The right ventricular size is normal.  3. The mitral valve is normal in structure. No evidence of mitral valve regurgitation.  4. The aortic valve is tricuspid. Aortic valve regurgitation is not visualized. Aortic valve sclerosis/calcification is present, without any evidence of aortic stenosis. FINDINGS  Left Ventricle: Left ventricular ejection fraction, by estimation, is 50 to 55%. The left ventricle has low normal function. The left ventricle has no regional wall motion abnormalities. The left ventricular internal cavity size was normal in size. There is mild left ventricular hypertrophy of the basal-septal segment. Left ventricular diastolic parameters are consistent with Grade I diastolic dysfunction (impaired relaxation). Right Ventricle: The right ventricular size is normal. No increase in right ventricular wall thickness. Right ventricular systolic function is normal. Left Atrium: Left atrial size was normal in size. Right Atrium: Right atrial size was normal in size. Pericardium: There is no evidence of pericardial effusion. Mitral Valve: The mitral valve is normal in structure. No evidence of mitral valve regurgitation. Tricuspid Valve: The tricuspid valve is not well visualized. Tricuspid valve regurgitation is mild. Aortic Valve: The aortic valve is tricuspid. Aortic valve regurgitation is not visualized. Aortic valve sclerosis/calcification is present, without any evidence of aortic stenosis. Aortic valve peak gradient measures 11.8 mmHg. Pulmonic Valve: The pulmonic valve was not well visualized. Pulmonic valve regurgitation is not visualized. Aorta: The aortic root and ascending aorta are structurally normal, with no evidence of  dilitation. IAS/Shunts: No atrial level shunt detected by color flow Doppler.  LEFT VENTRICLE PLAX 2D LVIDd:         4.30 cm      Diastology LVIDs:         2.90 cm      LV e' medial:    6.74 cm/s LV PW:         1.20 cm  LV E/e' medial:  8.3 LV IVS:        1.00 cm      LV e' lateral:   5.66 cm/s LVOT diam:     1.80 cm      LV E/e' lateral: 9.8 LV SV:         40 LV SV Index:   22 LVOT Area:     2.54 cm  LV Volumes (MOD) LV vol d, MOD A2C: 73.4 ml LV vol d, MOD A4C: 126.0 ml LV vol s, MOD A2C: 38.6 ml LV vol s, MOD A4C: 47.7 ml LV SV MOD A2C:     34.8 ml LV SV MOD A4C:     126.0 ml LV SV MOD BP:      55.2 ml RIGHT VENTRICLE RV Basal diam:  2.70 cm RV S prime:     12.00 cm/s TAPSE (M-mode): 1.8 cm LEFT ATRIUM             Index        RIGHT ATRIUM           Index LA diam:        4.00 cm 2.18 cm/m   RA Area:     10.00 cm LA Vol (A2C):   42.7 ml 23.25 ml/m  RA Volume:   19.50 ml  10.62 ml/m LA Vol (A4C):   21.8 ml 11.87 ml/m LA Biplane Vol: 32.6 ml 17.75 ml/m  AORTIC VALVE                 PULMONIC VALVE AV Area (Vmax): 1.41 cm     PV Vmax:       1.07 m/s AV Vmax:        172.00 cm/s  PV Peak grad:  4.6 mmHg AV Peak Grad:   11.8 mmHg LVOT Vmax:      95.30 cm/s LVOT Vmean:     61.200 cm/s LVOT VTI:       0.156 m  AORTA Ao Root diam: 2.40 cm Ao Asc diam:  2.60 cm MITRAL VALVE               TRICUSPID VALVE MV Area (PHT): 3.40 cm    TV Peak grad:   25.7 mmHg MV Decel Time: 223 msec    TV Vmax:        2.53 m/s MV E velocity: 55.70 cm/s MV A velocity: 93.40 cm/s  SHUNTS MV E/A ratio:  0.60        Systemic VTI:  0.16 m                            Systemic Diam: 1.80 cm Kate Sable MD Electronically signed by Kate Sable MD Signature Date/Time: 01/03/2021/10:15:32 AM    Final    IR IMAGING GUIDED PORT INSERTION  Result Date: 01/17/2021 INDICATION: ovarian cancer needs port EXAM: 1. Ultrasound-guided puncture of the right internal jugular vein 2. Placement of a right-sided chest port using fluoroscopic  guidance MEDICATIONS: None ANESTHESIA/SEDATION: Moderate (conscious) sedation was employed during this procedure. A total of Versed 2 mg and Fentanyl 100 mcg was administered intravenously. Moderate Sedation Time: 18 minutes. The patient's level of consciousness and vital signs were monitored continuously by radiology nursing throughout the procedure under my direct supervision. FLUOROSCOPY TIME:  Fluoroscopy Time: 0 minutes 18 seconds (1.2 mGy). COMPLICATIONS: None immediate. PROCEDURE: Informed written consent was obtained from the patient after a thorough discussion of the procedural risks,  benefits and alternatives. All questions were addressed. Maximal Sterile Barrier Technique was utilized including caps, mask, sterile gowns, sterile gloves, sterile drape, hand hygiene and skin antiseptic. A timeout was performed prior to the initiation of the procedure. The patient was placed supine on the exam table. The right neck and chest was prepped and draped in the standard sterile fashion. A preliminary ultrasound of the right neck was performed and demonstrates a patent right internal jugular vein. A permanent ultrasound image was stored in the electronic medical record. The overlying skin was anesthetized with 1% Lidocaine. Using ultrasound guidance, access was obtained into the right internal jugular vein using a 21 gauge micropuncture set. A wire was advanced into the SVC, a short incision was made at the puncture site, and serial dilatation performed. Next, in an ipsilateral infraclavicular location, an incision was made at the site of the subcutaneous reservoir. Blunt dissection was used to open a pocket to contain the reservoir. A subcutaneous tunnel was then created from the port site to the puncture site. A(n) 8 Fr single lumen catheter was advanced through the tunnel. The catheter was attached to the port and this was placed in the subcutaneous pocket. Under fluoroscopic guidance, a peel away sheath was  placed, and the catheter was trimmed to the appropriate length and was advanced into the central veins. The catheter length is 20 cm. The tip of the catheter lies near the superior cavoatrial junction. The port flushes and aspirates appropriately. The port was flushed and locked with heparinized saline. The port pocket was closed in 2 layers using 3-0 and 4-0 Vicryl/absorbable suture. Dermabond was also applied to both incisions. The patient tolerated the procedure well and was transferred to recovery in stable condition. IMPRESSION: Successful placement of a right chest port via the right internal jugular vein. The port is ready immediate use. Electronically Signed   By: Albin Felling M.D.   On: 01/17/2021 13:22   IR Paracentesis  Result Date: 01/17/2021 INDICATION: malignant ascites EXAM: ULTRASOUND GUIDED  PARACENTESIS MEDICATIONS: None. COMPLICATIONS: None immediate. PROCEDURE: Informed written consent was obtained from the patient after a discussion of the risks, benefits and alternatives to treatment. A timeout was performed prior to the initiation of the procedure. Initial ultrasound scanning demonstrates a large amount of ascites within the right lower abdominal quadrant. The right lower abdomen was prepped and draped in the usual sterile fashion. 1% lidocaine was used for local anesthesia. Following this, a 19 gauge 10 cm Yueh catheter was introduced. An ultrasound image was saved for documentation purposes. The paracentesis was performed. The catheter was removed and a dressing was applied. The patient tolerated the procedure well without immediate post procedural complication. FINDINGS: A total of approximately 9.8 L of clear, dark amber ascites fluid was removed. IMPRESSION: Successful ultrasound-guided paracentesis yielding 9.8 L of peritoneal fluid. Electronically Signed   By: Albin Felling M.D.   On: 01/17/2021 13:18     Assessment and plan- Patient is a 76 y.o. 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  Patient will return next week to receive Mvasi alone at 10 mg/kg.  She will proceed with CarboTaxol chemotherapy today and receives Udenyca on day 2 or day 3.  Her blood pressure is presently stable and her 24-hour urine protein  revealed 126 mg of protein which would be acceptable to proceed with Mvasi.   I will see her back in 3 weeks for cycle 2 of CarboTaxol Mvasi chemotherapy and with cycle 2 she will be receiving Mvasi at 15 mg/kg dose.  Also receive Venofer and B12 on that day  Okay to take as needed Ativan for anxiety.  Patient has her PET scan scheduled for next week.  HRD testing pending.  Genetic counseling appointment scheduled.  She will be seen in 10 days at [facility] for possible IV fluids.  Patient will receive Venofer today along with B12 injection and also receive B12 and Venofer when she comes for [facility] visit.    Visit Diagnosis 1. Encounter for antineoplastic chemotherapy   2. Encounter for monoclonal antibody treatment for malignancy   3. Malignant neoplasm of ovary, unspecified laterality ([place])   4. 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 Randa Evens, MD, MPH [facility] [phone] 01/18/2021 8:33 AM

## 2021-01-18 NOTE — Patient Instructions (Signed)
[facility] [place]  Discharge Instructions: Thank you for choosing [facility] to provide your oncology and hematology care.  If you have a lab appointment with the [facility], please go directly to the [facility] and check in at the registration area.  Wear comfortable clothing and clothing appropriate for easy access to any Portacath or PICC line.   We strive to give you quality time with your provider. You may need to reschedule your appointment if you arrive late (15 or more minutes).  Arriving late affects you and other patients whose appointments are after yours.  Also, if you miss three or more appointments without notifying the office, you may be dismissed from the clinic at the providers discretion.      For prescription refill requests, have your pharmacy contact our office and allow 72 hours for refills to be completed.    Today you received the following chemotherapy and/or immunotherapy agents Taxol and Carboplatin       To help prevent nausea and vomiting after your treatment, we encourage you to take your nausea medication as directed.  BELOW ARE SYMPTOMS THAT SHOULD BE REPORTED IMMEDIATELY: *FEVER GREATER THAN 100.4 F (38 C) OR HIGHER *CHILLS OR SWEATING *NAUSEA AND VOMITING THAT IS NOT CONTROLLED WITH YOUR NAUSEA MEDICATION *UNUSUAL SHORTNESS OF BREATH *UNUSUAL BRUISING OR BLEEDING *URINARY PROBLEMS (pain or burning when urinating, or frequent urination) *BOWEL PROBLEMS (unusual diarrhea, constipation, pain near the anus) TENDERNESS IN MOUTH AND THROAT WITH OR WITHOUT PRESENCE OF ULCERS (sore throat, sores in mouth, or a toothache) UNUSUAL RASH, SWELLING OR PAIN  UNUSUAL VAGINAL DISCHARGE OR ITCHING   Items with * indicate a potential emergency and should be followed up as soon as possible or go to the Emergency Department if any problems should occur.  Please show the CHEMOTHERAPY ALERT CARD or IMMUNOTHERAPY ALERT CARD at  check-in to the Emergency Department and triage nurse.  Should you have questions after your visit or need to cancel or reschedule your appointment, please contact [facility] [place] AT [place]  [phone] and follow the prompts.  Office hours are 8:00 a.m. to 4:30 p.m. Monday - Friday. Please note that voicemails left after 4:00 p.m. may not be returned until the following business day.  We are closed weekends and major holidays. You have access to a nurse at all times for urgent questions. Please call the main number to the clinic [phone] and follow the prompts.  For any non-urgent questions, you may also contact your provider using MyChart. We now offer e-Visits for anyone 22 and older to request care online for non-urgent symptoms. For details visit [email or web address].   Also download the MyChart app! Go to the app store, search "MyChart", open the app, select [facility], and log in with your MyChart username and password.  Due to Covid, a mask is required upon entering the hospital/clinic. If you do not have a mask, one will be given to you upon arrival. For doctor visits, patients may have 1 support person aged 54 or older with them. For treatment visits, patients cannot have anyone with them due to current Covid guidelines and our immunocompromised population.    Paclitaxel injection What is this medication? PACLITAXEL (PAK li TAX el) is a chemotherapy drug. It targets fast dividing cells, like cancer cells, and causes these cells to die. This medicine is used to treat ovarian cancer, breast cancer, lung cancer, Kaposi's sarcoma, and other cancers. This medicine may be used  for other purposes; ask your health care provider or pharmacist if you have questions. COMMON BRAND NAME(S): Onxol, Taxol What should I tell my care team before I take this medication? They need to know if you have any of these conditions: history of irregular heartbeat liver disease low  blood counts, like low white cell, platelet, or red cell counts lung or breathing disease, like asthma tingling of the fingers or toes, or other nerve disorder an unusual or allergic reaction to paclitaxel, alcohol, polyoxyethylated castor oil, other chemotherapy, other medicines, foods, dyes, or preservatives pregnant or trying to get pregnant breast-feeding How should I use this medication? This drug is given as an infusion into a vein. It is administered in a hospital or clinic by a specially trained health care professional. Talk to your pediatrician regarding the use of this medicine in children. Special care may be needed. Overdosage: If you think you have taken too much of this medicine contact a poison control center or emergency room at once. NOTE: This medicine is only for you. Do not share this medicine with others. What if I miss a dose? It is important not to miss your dose. Call your doctor or health care professional if you are unable to keep an appointment. What may interact with this medication? Do not take this medicine with any of the following medications: live virus vaccines This medicine may also interact with the following medications: antiviral medicines for hepatitis, HIV or AIDS certain antibiotics like erythromycin and clarithromycin certain medicines for fungal infections like ketoconazole and itraconazole certain medicines for seizures like carbamazepine, phenobarbital, phenytoin gemfibrozil nefazodone rifampin St. John's wort This list may not describe all possible interactions. Give your health care provider a list of all the medicines, herbs, non-prescription drugs, or dietary supplements you use. Also tell them if you smoke, drink alcohol, or use illegal drugs. Some items may interact with your medicine. What should I watch for while using this medication? Your condition will be monitored carefully while you are receiving this medicine. You will need  important blood work done while you are taking this medicine. This medicine can cause serious allergic reactions. To reduce your risk you will need to take other medicine(s) before treatment with this medicine. If you experience allergic reactions like skin rash, itching or hives, swelling of the face, lips, or tongue, tell your doctor or health care professional right away. In some cases, you may be given additional medicines to help with side effects. Follow all directions for their use. This drug may make you feel generally unwell. This is not uncommon, as chemotherapy can affect healthy cells as well as cancer cells. Report any side effects. Continue your course of treatment even though you feel ill unless your doctor tells you to stop. Call your doctor or health care professional for advice if you get a fever, chills or sore throat, or other symptoms of a cold or flu. Do not treat yourself. This drug decreases your body's ability to fight infections. Try to avoid being around people who are sick. This medicine may increase your risk to bruise or bleed. Call your doctor or health care professional if you notice any unusual bleeding. Be careful brushing and flossing your teeth or using a toothpick because you may get an infection or bleed more easily. If you have any dental work done, tell your dentist you are receiving this medicine. Avoid taking products that contain aspirin, acetaminophen, ibuprofen, naproxen, or ketoprofen unless instructed by your doctor. These  medicines may hide a fever. Do not become pregnant while taking this medicine. Women should inform their doctor if they wish to become pregnant or think they might be pregnant. There is a potential for serious side effects to an unborn child. Talk to your health care professional or pharmacist for more information. Do not breast-feed an infant while taking this medicine. Men are advised not to father a child while receiving this  medicine. This product may contain alcohol. Ask your pharmacist or healthcare provider if this medicine contains alcohol. Be sure to tell all healthcare providers you are taking this medicine. Certain medicines, like metronidazole and disulfiram, can cause an unpleasant reaction when taken with alcohol. The reaction includes flushing, headache, nausea, vomiting, sweating, and increased thirst. The reaction can last from 30 minutes to several hours. What side effects may I notice from receiving this medication? Side effects that you should report to your doctor or health care professional as soon as possible: allergic reactions like skin rash, itching or hives, swelling of the face, lips, or tongue breathing problems changes in vision fast, irregular heartbeat high or low blood pressure mouth sores pain, tingling, numbness in the hands or feet signs of decreased platelets or bleeding - bruising, pinpoint red spots on the skin, black, tarry stools, blood in the urine signs of decreased red blood cells - unusually weak or tired, feeling faint or lightheaded, falls signs of infection - fever or chills, cough, sore throat, pain or difficulty passing urine signs and symptoms of liver injury like dark yellow or brown urine; general ill feeling or flu-like symptoms; light-colored stools; loss of appetite; nausea; right upper belly pain; unusually weak or tired; yellowing of the eyes or skin swelling of the ankles, feet, hands unusually slow heartbeat Side effects that usually do not require medical attention (report to your doctor or health care professional if they continue or are bothersome): diarrhea hair loss loss of appetite muscle or joint pain nausea, vomiting pain, redness, or irritation at site where injected tiredness This list may not describe all possible side effects. Call your doctor for medical advice about side effects. You may report side effects to FDA at 1-800-FDA-1088. Where  should I keep my medication? This drug is given in a hospital or clinic and will not be stored at home. NOTE: This sheet is a summary. It may not cover all possible information. If you have questions about this medicine, talk to your doctor, pharmacist, or health care provider.  2022 Elsevier/Gold Standard (2020-09-07 00:00:00)    Carboplatin injection What is this medication? CARBOPLATIN (KAR boe pla tin) is a chemotherapy drug. It targets fast dividing cells, like cancer cells, and causes these cells to die. This medicine is used to treat ovarian cancer and many other cancers. This medicine may be used for other purposes; ask your health care provider or pharmacist if you have questions. COMMON BRAND NAME(S): Paraplatin What should I tell my care team before I take this medication? They need to know if you have any of these conditions: blood disorders hearing problems kidney disease recent or ongoing radiation therapy an unusual or allergic reaction to carboplatin, cisplatin, other chemotherapy, other medicines, foods, dyes, or preservatives pregnant or trying to get pregnant breast-feeding How should I use this medication? This drug is usually given as an infusion into a vein. It is administered in a hospital or clinic by a specially trained health care professional. Talk to your pediatrician regarding the use of this medicine in children.  Special care may be needed. °Overdosage: If you think you have taken too much of this medicine contact a poison control center or emergency room at once. °NOTE: This medicine is only for you. Do not share this medicine with others. °What if I miss a dose? °It is important not to miss a dose. Call your doctor or health care professional if you are unable to keep an appointment. °What may interact with this medication? °medicines for seizures °medicines to increase blood counts like filgrastim, pegfilgrastim, sargramostim °some antibiotics like amikacin,  gentamicin, neomycin, streptomycin, tobramycin °vaccines °Talk to your doctor or health care professional before taking any of these medicines: °acetaminophen °aspirin °ibuprofen °ketoprofen °naproxen °This list may not describe all possible interactions. Give your health care provider a list of all the medicines, herbs, non-prescription drugs, or dietary supplements you use. Also tell them if you smoke, drink alcohol, or use illegal drugs. Some items may interact with your medicine. °What should I watch for while using this medication? °Your condition will be monitored carefully while you are receiving this medicine. You will need important blood work done while you are taking this medicine. °This drug may make you feel generally unwell. This is not uncommon, as chemotherapy can affect healthy cells as well as cancer cells. Report any side effects. Continue your course of treatment even though you feel ill unless your doctor tells you to stop. °In some cases, you may be given additional medicines to help with side effects. Follow all directions for their use. °Call your doctor or health care professional for advice if you get a fever, chills or sore throat, or other symptoms of a cold or flu. Do not treat yourself. This drug decreases your body's ability to fight infections. Try to avoid being around people who are sick. °This medicine may increase your risk to bruise or bleed. Call your doctor or health care professional if you notice any unusual bleeding. °Be careful brushing and flossing your teeth or using a toothpick because you may get an infection or bleed more easily. If you have any dental work done, tell your dentist you are receiving this medicine. °Avoid taking products that contain aspirin, acetaminophen, ibuprofen, naproxen, or ketoprofen unless instructed by your doctor. These medicines may hide a fever. °Do not become pregnant while taking this medicine. Women should inform their doctor if they wish  to become pregnant or think they might be pregnant. There is a potential for serious side effects to an unborn child. Talk to your health care professional or pharmacist for more information. Do not breast-feed an infant while taking this medicine. °What side effects may I notice from receiving this medication? °Side effects that you should report to your doctor or health care professional as soon as possible: °allergic reactions like skin rash, itching or hives, swelling of the face, lips, or tongue °signs of infection - fever or chills, cough, sore throat, pain or difficulty passing urine °signs of decreased platelets or bleeding - bruising, pinpoint red spots on the skin, black, tarry stools, nosebleeds °signs of decreased red blood cells - unusually weak or tired, fainting spells, lightheadedness °breathing problems °changes in hearing °changes in vision °chest pain °high blood pressure °low blood counts - This drug may decrease the number of white blood cells, red blood cells and platelets. You may be at increased risk for infections and bleeding. °nausea and vomiting °pain, swelling, redness or irritation at the injection site °pain, tingling, numbness in the hands or feet °problems   with balance, talking, walking trouble passing urine or change in the amount of urine Side effects that usually do not require medical attention (report to your doctor or health care professional if they continue or are bothersome): hair loss loss of appetite metallic taste in the mouth or changes in taste This list may not describe all possible side effects. Call your doctor for medical advice about side effects. You may report side effects to FDA at 1-800-FDA-1088. Where should I keep my medication? This drug is given in a hospital or clinic and will not be stored at home. NOTE: This sheet is a summary. It may not cover all possible information. If you have questions about this medicine, talk to your doctor, pharmacist,  or health care provider.  2022 Elsevier/Gold Standard (2007-05-29 00:00:00)    Palonosetron Injection What is this medication? PALONOSETRON (pal oh NOE se tron) is used to prevent nausea and vomiting caused by chemotherapy. It also helps prevent delayed nausea and vomiting that may occur a few days after your treatment. This medicine may be used for other purposes; ask your health care provider or pharmacist if you have questions. COMMON BRAND NAME(S): Aloxi What should I tell my care team before I take this medication? They need to know if you have any of these conditions: an unusual or allergic reaction to palonosetron, dolasetron, granisetron, ondansetron, other medicines, foods, dyes, or preservatives pregnant or trying to get pregnant breast-feeding How should I use this medication? This medicine is for infusion into a vein. It is given by a health care professional in a hospital or clinic setting. Talk to your pediatrician regarding the use of this medicine in children. While this drug may be prescribed for children as young as 1 month for selected conditions, precautions do apply. Overdosage: If you think you have taken too much of this medicine contact a poison control center or emergency room at once. NOTE: This medicine is only for you. Do not share this medicine with others. What if I miss a dose? This does not apply. What may interact with this medication? certain medicines for depression, anxiety, or psychotic disturbances fentanyl linezolid MAOIs like Carbex, Eldepryl, Marplan, Nardil, and Parnate methylene blue (injected into a vein) tramadol This list may not describe all possible interactions. Give your health care provider a list of all the medicines, herbs, non-prescription drugs, or dietary supplements you use. Also tell them if you smoke, drink alcohol, or use illegal drugs. Some items may interact with your medicine. What should I watch for while using this  medication? Your condition will be monitored carefully while you are receiving this medicine. What side effects may I notice from receiving this medication? Side effects that you should report to your doctor or health care professional as soon as possible: allergic reactions like skin rash, itching or hives, swelling of the face, lips, or tongue breathing problems confusion dizziness fast, irregular heartbeat fever and chills loss of balance or coordination seizures sweating swelling of the hands and feet tremors unusually weak or tired Side effects that usually do not require medical attention (report to your doctor or health care professional if they continue or are bothersome): constipation or diarrhea headache This list may not describe all possible side effects. Call your doctor for medical advice about side effects. You may report side effects to FDA at 1-800-FDA-1088. Where should I keep my medication? This drug is given in a hospital or clinic and will not be stored at home. NOTE: This sheet  is a summary. It may not cover all possible information. If you have questions about this medicine, talk to your doctor, pharmacist, or health care provider.  2022 Elsevier/Gold Standard (2012-10-25 00:00:00)    Fosaprepitant injection What is this medication? FOSAPREPITANT (fos ap RE pi tant) is used together with other medicines to prevent nausea and vomiting caused by cancer treatment (chemotherapy). This medicine may be used for other purposes; ask your health care provider or pharmacist if you have questions. COMMON BRAND NAME(S): Emend What should I tell my care team before I take this medication? They need to know if you have any of these conditions: liver disease an unusual or allergic reaction to fosaprepitant, aprepitant, medicines, foods, dyes, or preservatives pregnant or trying to get pregnant breast-feeding How should I use this medication? This medicine is for  injection into a vein. It is given by a health care professional in a hospital or clinic setting. Talk to your pediatrician regarding the use of this medicine in children. While this drug may be prescribed for children as young as 6 months for selected conditions, precautions do apply. Overdosage: If you think you have taken too much of this medicine contact a poison control center or emergency room at once. NOTE: This medicine is only for you. Do not share this medicine with others. What if I miss a dose? This does not apply. What may interact with this medication? Do not take this medicine with any of these medicines: cisapride flibanserin lomitapide pimozide This medicine may also interact with the following medications: diltiazem female hormones, like estrogens or progestins and birth control pills medicines for fungal infections like ketoconazole and itraconazole medicines for HIV medicines for seizures or to control epilepsy like carbamazepine or phenytoin medicines used for sleep or anxiety disorders like alprazolam, diazepam, or midazolam nefazodone paroxetine ranolazine rifampin some chemotherapy medications like etoposide, ifosfamide, vinblastine, vincristine some antibiotics like clarithromycin, erythromycin, troleandomycin steroid medicines like dexamethasone or methylprednisolone tolbutamide warfarin This list may not describe all possible interactions. Give your health care provider a list of all the medicines, herbs, non-prescription drugs, or dietary supplements you use. Also tell them if you smoke, drink alcohol, or use illegal drugs. Some items may interact with your medicine. What should I watch for while using this medication? Do not take this medicine if you already have nausea and vomiting. Ask your health care provider what to do if you already have nausea. Birth control pills and other methods of hormonal contraception (for example, IUD or patch) may not work  properly while you are taking this medicine. Use an extra method of birth control during treatment and for 1 month after your last dose of fosaprepitant. This medicine should not be used continuously for a long time. Visit your doctor or health care professional for regular check-ups. This medicine may change your liver function blood test results. What side effects may I notice from receiving this medication? Side effects that you should report to your doctor or health care professional as soon as possible: allergic reactions like skin rash, itching or hives, swelling of the face, lips, or tongue breathing problems changes in heart rhythm high or low blood pressure pain, redness, or irritation at site where injected rectal bleeding serious dizziness or disorientation, confusion sharp or severe stomach pain sharp pain in your leg Side effects that usually do not require medical attention (report to your doctor or health care professional if they continue or are bothersome): constipation or diarrhea hair loss headache hiccups  loss of appetite nausea upset stomach tiredness This list may not describe all possible side effects. Call your doctor for medical advice about side effects. You may report side effects to FDA at 1-800-FDA-1088. Where should I keep my medication? This drug is given in a hospital or clinic and will not be stored at home. NOTE: This sheet is a summary. It may not cover all possible information. If you have questions about this medicine, talk to your doctor, pharmacist, or health care provider.  2022 Elsevier/Gold Standard (2016-04-07 00:00:00)

## 2021-01-18 NOTE — Progress Notes (Signed)
Pt here for first treatment. She took imitrex for migraine, she also had pain at port site and took Ativan today. She feels better now

## 2021-01-18 NOTE — Progress Notes (Signed)
Nutrition Assessment:  Referral from Dr Theora Gianotti regarding hypoalbuminemia.    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  Past medical history of DVT, depression, HTN, UTI.  Patient receiving neoadjuvant chemotherapy of carboplatin, taxol.    Met with patient during infusion.  Patient reports that she has a good appetite.  Typically has not been a breakfast eater but recently has been trying to eat breakfast of grits or 2 eggs and 2 slices of [place] bacon.  Mid-day may have chicken and rice (son recently prepared) or frozen meal with meat.  Usually does not eat much after 3-4 pm.  May have a protein drink (ensure max protein 150 calories, 30 g protein).  Patient tries to stay away from cheese due to constipation.  Previously had issues with constipation but has been taking miralax BID and having loose stools and has cut back on dosage.    Noted paracentesis on 12/31 and 12 liters removed. 1/16 another 9 liters removed.   Medications: dexamethasone, folic acid, ativan, zofran, miralax, compazine  Labs: Na 134, glucose 112, cal 8.2, albumin 2.7  Anthropometrics:   Height: 63 inches Weight: 159 lb 1.6 oz today (fluid removed) UBW: Patient reports pre-COVID weight was in the 140s and gained weight/fluid 01/14/2020 179 lb (1 year ago) BMI: 28  11% weight loss in the last year (fluid loss as well)   Estimated Energy Needs  Kcals: 1800-2160 Protein: 90-108 g Fluid: 1.8 L  NUTRITION DIAGNOSIS: none at this time   INTERVENTION:  Discussed importance of nutrition during treatment and weight maintenance. Discussed foods high in protein and handout provided. Encouraged protein food at every meal/snack Coupons given for ensure max protein shakes. Encouraged patient to continue with shakes.   Reviewed foods and strategies to help with nausea if patient experiences this following treatment.  Handout provided. Contact information provided      MONITORING, EVALUATION, GOAL: weight trends, intake   NEXT VISIT: Tuesday, Feb 7 during infusion  Namya Voges B. Zenia Resides, [place], [place] Registered Dietitian [phone] (mobile)

## 2021-01-19 ENCOUNTER — Encounter: Payer: Self-pay | Admitting: Oncology

## 2021-01-19 NOTE — Telephone Encounter (Signed)
Contacted patient s/p first chemotherapy treatment (carbo/taxol). She is doing well. She does not have any immediate concerns. She denies any symptoms of nausea/vomiting/diarrhea, fevers or rashes. She is scheduled to get her Udencya tomorrow afternoon at 130pm and has Claritin available to prevent the boney pain. She is scheduled to get Bevacizumab/venofer on 1/24. She has all of her antiemetics and OTC meds available to aid with symptom mgmt. She understands that she can contact our office should she have any additional concerns. She thanked me for calling her and providing excellent care at the clinic.

## 2021-01-20 ENCOUNTER — Inpatient Hospital Stay: Payer: Medicare PPO

## 2021-01-20 ENCOUNTER — Other Ambulatory Visit: Payer: Self-pay

## 2021-01-20 DIAGNOSIS — Z5112 Encounter for antineoplastic immunotherapy: Secondary | ICD-10-CM | POA: Diagnosis not present

## 2021-01-20 DIAGNOSIS — R188 Other ascites: Secondary | ICD-10-CM | POA: Diagnosis not present

## 2021-01-20 DIAGNOSIS — C786 Secondary malignant neoplasm of retroperitoneum and peritoneum: Secondary | ICD-10-CM | POA: Diagnosis not present

## 2021-01-20 DIAGNOSIS — C569 Malignant neoplasm of unspecified ovary: Secondary | ICD-10-CM | POA: Diagnosis not present

## 2021-01-20 DIAGNOSIS — D509 Iron deficiency anemia, unspecified: Secondary | ICD-10-CM | POA: Diagnosis not present

## 2021-01-20 DIAGNOSIS — R634 Abnormal weight loss: Secondary | ICD-10-CM | POA: Diagnosis not present

## 2021-01-20 DIAGNOSIS — Z5111 Encounter for antineoplastic chemotherapy: Secondary | ICD-10-CM | POA: Diagnosis not present

## 2021-01-20 DIAGNOSIS — D519 Vitamin B12 deficiency anemia, unspecified: Secondary | ICD-10-CM | POA: Diagnosis not present

## 2021-01-20 DIAGNOSIS — F419 Anxiety disorder, unspecified: Secondary | ICD-10-CM | POA: Diagnosis not present

## 2021-01-20 MED ORDER — PEGFILGRASTIM-CBQV 6 MG/0.6ML ~~LOC~~ SOSY
6.0000 mg | PREFILLED_SYRINGE | Freq: Once | SUBCUTANEOUS | Status: AC
  Administered 2021-01-20: 6 mg via SUBCUTANEOUS
  Filled 2021-01-20: qty 0.6

## 2021-01-21 ENCOUNTER — Telehealth: Payer: Self-pay | Admitting: *Deleted

## 2021-01-21 ENCOUNTER — Inpatient Hospital Stay: Payer: Medicare PPO

## 2021-01-21 ENCOUNTER — Inpatient Hospital Stay: Payer: Medicare PPO | Admitting: Licensed Clinical Social Worker

## 2021-01-21 MED ORDER — HYDROCODONE-ACETAMINOPHEN 5-325 MG PO TABS: 1.0000 | ORAL_TABLET | Freq: Four times a day (QID) | ORAL | 0 refills | Status: DC | PRN

## 2021-01-21 NOTE — Telephone Encounter (Signed)
I spoke with patient. She reports generalized bony pain since receiving Udenyca.  She is taking loratadine daily and has been using acetaminophen every few hours, with little improvement in symptoms.  Discussed that ostealgia is a common side effect from the [place].  We discussed nonpharmacological strategies including use of heat.  Symptoms should gradually improve over the next several days.  Patient would like something stronger for pain.  She says that she has taken tramadol in the past with no effect.  She reports good response to Norco.  We will send a prescription for limited quantity Norco.  Discussed with her that this medication includes acetaminophen and she will have to limit extra dosing of Tylenol.  We also discussed ensuring that she is on daily MiraLAX to prevent opioid-induced constipation.  Patient has follow up scheduled with Beckey Rutter, NP on 1/27. We can see her sooner in [facility] if symptoms worsen or do not improve.

## 2021-01-21 NOTE — Telephone Encounter (Signed)
Patient called reporting that she is having aching all over and could not sleep due to pain form her injection yesterday. She is taking Tylenol every 2-4 hours with no relief. She is asking what she can put on her knees, heat or does she need to do activity. Please advise

## 2021-01-21 NOTE — Telephone Encounter (Signed)
Can you reach out to her?

## 2021-01-24 ENCOUNTER — Other Ambulatory Visit: Payer: Self-pay | Admitting: *Deleted

## 2021-01-24 ENCOUNTER — Telehealth: Payer: Self-pay | Admitting: *Deleted

## 2021-01-24 ENCOUNTER — Other Ambulatory Visit: Payer: Self-pay | Admitting: Nurse Practitioner

## 2021-01-24 ENCOUNTER — Encounter: Payer: Self-pay | Admitting: Oncology

## 2021-01-24 DIAGNOSIS — C569 Malignant neoplasm of unspecified ovary: Secondary | ICD-10-CM

## 2021-01-24 MED ORDER — HYDROCODONE-ACETAMINOPHEN 5-325 MG PO TABS: 1.0000 | ORAL_TABLET | ORAL | 0 refills | Status: DC | PRN

## 2021-01-24 MED ORDER — PREDNISONE 10 MG (21) PO TBPK: ORAL_TABLET | ORAL | 0 refills | Status: DC

## 2021-01-24 NOTE — Telephone Encounter (Signed)
Josh- please speak to her. Ok to give short course of steroids.

## 2021-01-24 NOTE — Telephone Encounter (Signed)
Patient called reporting that she is still having aching, but has moved from her knees to her back and si asking what she can do for it. States she is taking Vicodin every 4 hours and still not helping  (it is ordered to take every 6 hours). Please advise

## 2021-01-24 NOTE — Telephone Encounter (Signed)
I spoke with patient by phone.  She continues to endorse persistent and severe bony pain in knees, legs, and now back.  She denies other symptoms including fever or chills.  No respiratory or GI or GU symptoms reported.  She says that the Norco is helping but short-lived.  She is taking 1 tablet every 4 hours.  She denies any adverse effects from pain medications.  We discussed liberalizing Norco to 1 to 2 tablets every 4 hours as needed.  Additionally, we will start her on prednisone taper.  Patient has scheduled visit tomorrow for iron infusion.  She will let nurse know if she is not feeling better and we can see her in [facility] if needed.

## 2021-01-25 ENCOUNTER — Inpatient Hospital Stay: Payer: Medicare PPO

## 2021-01-25 ENCOUNTER — Other Ambulatory Visit: Payer: Medicare PPO

## 2021-01-25 ENCOUNTER — Ambulatory Visit: Payer: Medicare PPO

## 2021-01-25 ENCOUNTER — Inpatient Hospital Stay (HOSPITAL_BASED_OUTPATIENT_CLINIC_OR_DEPARTMENT_OTHER): Payer: Medicare PPO | Admitting: Hospice and Palliative Medicine

## 2021-01-25 ENCOUNTER — Other Ambulatory Visit: Payer: Self-pay

## 2021-01-25 ENCOUNTER — Encounter: Payer: Self-pay | Admitting: Licensed Clinical Social Worker

## 2021-01-25 ENCOUNTER — Other Ambulatory Visit: Payer: Self-pay | Admitting: Oncology

## 2021-01-25 ENCOUNTER — Inpatient Hospital Stay (HOSPITAL_BASED_OUTPATIENT_CLINIC_OR_DEPARTMENT_OTHER): Payer: Medicare PPO | Admitting: Licensed Clinical Social Worker

## 2021-01-25 VITALS — BP 152/86 | HR 93 | Temp 98.7°F | Resp 18

## 2021-01-25 DIAGNOSIS — Z5111 Encounter for antineoplastic chemotherapy: Secondary | ICD-10-CM | POA: Diagnosis not present

## 2021-01-25 DIAGNOSIS — G893 Neoplasm related pain (acute) (chronic): Secondary | ICD-10-CM | POA: Diagnosis not present

## 2021-01-25 DIAGNOSIS — Z5112 Encounter for antineoplastic immunotherapy: Secondary | ICD-10-CM | POA: Diagnosis not present

## 2021-01-25 DIAGNOSIS — R188 Other ascites: Secondary | ICD-10-CM | POA: Diagnosis not present

## 2021-01-25 DIAGNOSIS — Z8052 Family history of malignant neoplasm of bladder: Secondary | ICD-10-CM | POA: Diagnosis not present

## 2021-01-25 DIAGNOSIS — D519 Vitamin B12 deficiency anemia, unspecified: Secondary | ICD-10-CM | POA: Diagnosis not present

## 2021-01-25 DIAGNOSIS — C569 Malignant neoplasm of unspecified ovary: Secondary | ICD-10-CM

## 2021-01-25 DIAGNOSIS — R634 Abnormal weight loss: Secondary | ICD-10-CM | POA: Diagnosis not present

## 2021-01-25 DIAGNOSIS — F419 Anxiety disorder, unspecified: Secondary | ICD-10-CM | POA: Diagnosis not present

## 2021-01-25 DIAGNOSIS — D509 Iron deficiency anemia, unspecified: Secondary | ICD-10-CM | POA: Diagnosis not present

## 2021-01-25 DIAGNOSIS — Z803 Family history of malignant neoplasm of breast: Secondary | ICD-10-CM

## 2021-01-25 DIAGNOSIS — D508 Other iron deficiency anemias: Secondary | ICD-10-CM

## 2021-01-25 DIAGNOSIS — C786 Secondary malignant neoplasm of retroperitoneum and peritoneum: Secondary | ICD-10-CM | POA: Diagnosis not present

## 2021-01-25 MED ORDER — IRON SUCROSE 20 MG/ML IV SOLN
200.0000 mg | Freq: Once | INTRAVENOUS | Status: AC
  Administered 2021-01-25: 14:00:00 200 mg via INTRAVENOUS
  Filled 2021-01-25: qty 10

## 2021-01-25 MED ORDER — HEPARIN SOD (PORK) LOCK FLUSH 100 UNIT/ML IV SOLN
INTRAVENOUS | Status: AC
  Filled 2021-01-25: qty 5

## 2021-01-25 MED ORDER — SODIUM CHLORIDE 0.9 % IV SOLN: 200.0000 mg | INTRAVENOUS | Status: DC

## 2021-01-25 MED ORDER — SODIUM CHLORIDE 0.9 % IV SOLN
Freq: Once | INTRAVENOUS | Status: AC
  Filled 2021-01-25: qty 250

## 2021-01-25 MED ORDER — SODIUM CHLORIDE 0.9% FLUSH
10.0000 mL | INTRAVENOUS | Status: DC | PRN
  Administered 2021-01-25: 13:00:00 10 mL via INTRAVENOUS
  Filled 2021-01-25: qty 10

## 2021-01-25 MED ORDER — HEPARIN SOD (PORK) LOCK FLUSH 100 UNIT/ML IV SOLN
500.0000 [IU] | Freq: Once | INTRAVENOUS | Status: AC
  Administered 2021-01-25: 15:00:00 500 [IU] via INTRAVENOUS
  Filled 2021-01-25: qty 5

## 2021-01-25 MED ORDER — POLYETHYLENE GLYCOL 3350 17 GM/SCOOP PO POWD: ORAL | 3 refills | Status: DC

## 2021-01-25 MED ORDER — SODIUM CHLORIDE 0.9 % IV SOLN
700.0000 mg | Freq: Once | INTRAVENOUS | Status: AC
  Administered 2021-01-25: 15:00:00 700 mg via INTRAVENOUS
  Filled 2021-01-25: qty 28

## 2021-01-25 NOTE — Patient Instructions (Signed)
[facility] [place]   Discharge Instructions: Thank you for choosing [facility] to provide your oncology and hematology care.  If you have a lab appointment with the [facility], please go directly to the [facility] and check in at the registration area.   Wear comfortable clothing and clothing appropriate for easy access to any Portacath or PICC line.   We strive to give you quality time with your provider. You may need to reschedule your appointment if you arrive late (15 or more minutes).  Arriving late affects you and other patients whose appointments are after yours.  Also, if you miss three or more appointments without notifying the office, you may be dismissed from the clinic at the providers discretion.      For prescription refill requests, have your pharmacy contact our office and allow 72 hours for refills to be completed.    Today you received the following chemotherapy and/or immunotherapy agents: MVASI. Today you also received the following: Venofer.      To help prevent nausea and vomiting after your treatment, we encourage you to take your nausea medication as directed.  BELOW ARE SYMPTOMS THAT SHOULD BE REPORTED IMMEDIATELY: *FEVER GREATER THAN 100.4 F (38 C) OR HIGHER *CHILLS OR SWEATING *NAUSEA AND VOMITING THAT IS NOT CONTROLLED WITH YOUR NAUSEA MEDICATION *UNUSUAL SHORTNESS OF BREATH *UNUSUAL BRUISING OR BLEEDING *URINARY PROBLEMS (pain or burning when urinating, or frequent urination) *BOWEL PROBLEMS (unusual diarrhea, constipation, pain near the anus) TENDERNESS IN MOUTH AND THROAT WITH OR WITHOUT PRESENCE OF ULCERS (sore throat, sores in mouth, or a toothache) UNUSUAL RASH, SWELLING OR PAIN  UNUSUAL VAGINAL DISCHARGE OR ITCHING   Items with * indicate a potential emergency and should be followed up as soon as possible or go to the Emergency Department if any problems should occur.  Please show the CHEMOTHERAPY ALERT  CARD or IMMUNOTHERAPY ALERT CARD at check-in to the Emergency Department and triage nurse.  Should you have questions after your visit or need to cancel or reschedule your appointment, please contact [facility] [place]  Dept: [phone]  and follow the prompts.  Office hours are 8:00 a.m. to 4:30 p.m. Monday - Friday. Please note that voicemails left after 4:00 p.m. may not be returned until the following business day.  We are closed weekends and major holidays. You have access to a nurse at all times for urgent questions. Please call the main number to the clinic Dept: [phone] and follow the prompts.  For any non-urgent questions, you may also contact your provider using MyChart. We now offer e-Visits for anyone 75 and older to request care online for non-urgent symptoms. For details visit [email or web address].   Also download the MyChart app! Go to the app store, search "MyChart", open the app, select [facility], and log in with your MyChart username and password.  Due to Covid, a mask is required upon entering the hospital/clinic. If you do not have a mask, one will be given to you upon arrival. For doctor visits, patients may have 1 support person aged 71 or older with them. For treatment visits, patients cannot have anyone with them due to current Covid guidelines and our immunocompromised population.

## 2021-01-25 NOTE — Progress Notes (Signed)
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  She received Udenyca injection on 01/20/2021 and has had subsequent bony pain.  I have talked with patient by phone regarding her bony pain and yesterday liberalized her Norco and started her on prednisone.  Patient was seen in infusion today for follow-up prior to her receiving IV Venofer.  Today, patient reports that she is feeling better.  She still has some bony aches but feels like the pain is improving after starting prednisone yesterday.  She denies other symptomatic complaints or concerns today.  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[place])     PAST SURGICAL HISTORY:  Past Surgical History:  Procedure Laterality Date   BREAST BIOPSY Left 01/06/13   benign finding of sclerosing adenosis   COLONOSCOPY WITH PROPOFOL N/A 11/29/2015   Procedure: COLONOSCOPY WITH PROPOFOL;  Surgeon: Jonathon Bellows, MD;  Location: [facility] ENDOSCOPY;  Service: Endoscopy;  Laterality: N/A;   IR IMAGING GUIDED PORT INSERTION  01/17/2021   IR PARACENTESIS  01/17/2021   KNEE SURGERY Left 2010   torn meniscus   TONSILECTOMY, ADENOIDECTOMY, BILATERAL MYRINGOTOMY AND TUBES      HEMATOLOGY/ONCOLOGY HISTORY:  Oncology History  Malignant  neoplasm of ovary ([place])  01/13/2021 Initial Diagnosis   Malignant neoplasm of ovary ([place])   01/13/2021 Cancer Staging   Staging form: Ovary, Fallopian Tube, and Primary Peritoneal Carcinoma, AJCC 8th Edition - Clinical stage from 01/13/2021: Stage IVB (cTX, cNX, cM1b) - Signed by Sindy Guadeloupe, MD on 01/13/2021    01/18/2021 -  Chemotherapy   Patient is on Treatment Plan : OVARIAN Carboplatin (AUC 6) / Paclitaxel (175) q21d x 6 cycles       ALLERGIES:  has No Known Allergies.  MEDICATIONS:  Current Outpatient Medications  Medication Sig Dispense Refill   alendronate (FOSAMAX) 70 MG tablet Take 70 mg by mouth once a week. Take with a full glass of water on an empty stomach. (Patient not taking: Reported on 01/18/2021)     apixaban (ELIQUIS) 2.5 MG TABS tablet Take 1 tablet (2.5 mg total) by mouth 2 (two) times daily. 60 tablet 3   atorvastatin (LIPITOR) 20 MG tablet TAKE 1 TABLET BY MOUTH EVERY DAY 90 tablet 0   clonazePAM (KLONOPIN) 1 MG tablet Take 1 mg by mouth daily as needed for anxiety.     dexamethasone (DECADRON) 4 MG tablet Take 2 tablets (8 mg total) by mouth daily. Start the day after carboplatin chemotherapy for 3 days. 30 tablet 1   FLUoxetine (PROZAC) 40 MG capsule Take 40 mg by mouth daily. (Patient not taking: Reported on 6/94/8546)     folic acid (FOLVITE) 1 MG tablet Take 1 tablet (1 mg total)  by mouth daily. 30 tablet 2   HYDROcodone-acetaminophen (NORCO) 5-325 MG tablet Take 1-2 tablets by mouth every 4 (four) hours as needed for moderate pain. 45 tablet 0   lidocaine-prilocaine (EMLA) cream Apply to affected area once 30 g 3   LORazepam (ATIVAN) 0.5 MG tablet Take 1 tablet (0.5 mg total) by mouth every 6 (six) hours as needed (Nausea or vomiting). 30 tablet 0   ondansetron (ZOFRAN) 8 MG tablet Take 1 tablet (8 mg total) by mouth 2 (two) times daily as needed for refractory nausea / vomiting. Start on day 3 after carboplatin chemo. (Patient not taking: Reported on  01/18/2021) 30 tablet 1   polyethylene glycol powder (GLYCOLAX/MIRALAX) 17 GM/SCOOP powder Use as directed     predniSONE (STERAPRED UNI-PAK 21 TAB) 10 MG (21) TBPK tablet Take 6 tablets (60mg ) x 1 day, then take 5 tablets (50mg ) x 1 day, then take 4 tablets (40mg ) x 1 day, then take 3 tablets (30mg ) x 1 day, then take 2 tablets (20mg ) x 1 day, then take 1 tablet (10mg ) x 1 day, then stop 21 tablet 0   prochlorperazine (COMPAZINE) 10 MG tablet Take 1 tablet (10 mg total) by mouth every 6 (six) hours as needed (Nausea or vomiting). (Patient not taking: Reported on 01/18/2021) 30 tablet 1   QUEtiapine (SEROQUEL) 100 MG tablet Take 1 tablet (100 mg total) by mouth at bedtime. 90 tablet 1   SUMAtriptan (IMITREX) 100 MG tablet May repeat in 2 hours if headache persists or recurs. 9 tablet 5   No current facility-administered medications for this visit.   Facility-Administered Medications Ordered in Other Visits  Medication Dose Route Frequency Provider Last Rate Last Admin   heparin lock flush 100 unit/mL  500 Units Intravenous Once Sindy Guadeloupe, MD       sodium chloride flush (NS) 0.9 % injection 10 mL  10 mL Intravenous PRN Sindy Guadeloupe, MD   10 mL at 01/25/21 1310    VITAL SIGNS: There were no vitals taken for this visit. There were no vitals filed for this visit.  Estimated body mass index is 28.18 kg/m as calculated from the following:   Height as of 01/18/21: 5\' 3"  (1.6 m).   Weight as of 01/18/21: 159 lb 1.6 oz (72.2 kg).  LABS: CBC:    Component Value Date/Time   WBC 8.2 01/18/2021 0752   HGB 10.2 (L) 01/18/2021 0752   HCT 32.1 (L) 01/18/2021 0752   PLT 565 (H) 01/18/2021 0752   MCV 86.1 01/18/2021 0752   NEUTROABS 5.8 01/18/2021 0752   LYMPHSABS 1.2 01/18/2021 0752   MONOABS 0.9 01/18/2021 0752   EOSABS 0.2 01/18/2021 0752   BASOSABS 0.1 01/18/2021 0752   Comprehensive Metabolic Panel:    Component Value Date/Time   NA 134 (L) 01/18/2021 0752   NA 135 03/10/2015 0923    K 3.5 01/18/2021 0752   CL 101 01/18/2021 0752   CO2 26 01/18/2021 0752   BUN 17 01/18/2021 0752   BUN 11 03/10/2015 0923   CREATININE 0.47 01/18/2021 0752   CREATININE 0.97 (H) 10/20/2016 0844   GLUCOSE 112 (H) 01/18/2021 0752   CALCIUM 8.2 (L) 01/18/2021 0752   AST 23 01/18/2021 0752   ALT 15 01/18/2021 0752   ALKPHOS 66 01/18/2021 0752   BILITOT 0.4 01/18/2021 0752   BILITOT <0.2 11/11/2014 1624   PROT 5.8 (L) 01/18/2021 0752   PROT 7.3 11/11/2014 1624   ALBUMIN 2.7 (L) 01/18/2021 9937  ALBUMIN 4.7 11/11/2014 1624    RADIOGRAPHIC STUDIES: DG Chest 2 View  Result Date: 01/01/2021 CLINICAL DATA:  Shortness of breath. EXAM: CHEST - 2 VIEW COMPARISON:  PA Lat 12/30/2020. FINDINGS: The cardiac size is normal. There is mild aortic uncoiling, small amount of calcification. There are small pleural effusions collecting posteriorly, which was seen previously. The lungs are expiratory with very limited visualization of the lower lung fields. The visualized aerated lungs are clear. Osteopenia. IMPRESSION: Limited expiratory study. The lower lung fields are poorly evaluated. Visualized aerated lungs are clear. There are small pleural effusions. Overall aeration seems unchanged. Electronically Signed   By: Telford Nab M.D.   On: 01/01/2021 02:50   DG Chest 2 View  Result Date: 12/30/2020 CLINICAL DATA:  Shortness of breath with exertion for the past 2 weeks. EXAM: CHEST - 2 VIEW COMPARISON:  None. FINDINGS: The heart size and mediastinal contours are within normal limits. Normal pulmonary vascularity. Low lung volumes. Trace bilateral pleural effusions. No consolidation or pneumothorax. No acute osseous abnormality. IMPRESSION: 1. Low lung volumes with trace bilateral pleural effusions. Electronically Signed   By: Titus Dubin M.D.   On: 12/30/2020 12:42   CT Angio Chest PE W and/or Wo Contrast  Result Date: 01/01/2021 CLINICAL DATA:  Acute, nonlocalized abdominal pain. Shortness of  breath for 1 week. EXAM: CT ANGIOGRAPHY CHEST CT ABDOMEN AND PELVIS WITH CONTRAST TECHNIQUE: Multidetector CT imaging of the chest was performed using the standard protocol during bolus administration of intravenous contrast. Multiplanar CT image reconstructions and MIPs were obtained to evaluate the vascular anatomy. Multidetector CT imaging of the abdomen and pelvis was performed using the standard protocol during bolus administration of intravenous contrast. CONTRAST:  129mL OMNIPAQUE IOHEXOL 350 MG/ML SOLN COMPARISON:  Abdominal CT 05/26/2015 FINDINGS: CTA CHEST FINDINGS Cardiovascular: Satisfactory opacification of the pulmonary arteries to the segmental level on second injection, first being severely degraded by bolus dispersion. No evidence of pulmonary embolism. Normal heart size. No pericardial effusion. Coronary atherosclerosis. Mediastinum/Nodes: No mass or adenopathy. Lungs/Pleura: Small pleural effusions on the right more than left with atelectasis. Musculoskeletal: No acute or aggressive finding Review of the MIP images confirms the above findings. CT ABDOMEN and PELVIS FINDINGS Hepatobiliary: Compressed liver due to the size of ascites. Cholelithiasis without acute cholecystitis or ductal dilatation Pancreas: No acute finding.  No inflammation or mass seen Spleen: Negative Adrenals/Urinary Tract: Negative adrenals and kidneys. Largely collapsed bladder. Stomach/Bowel: No obstruction, inflammation, or visible mass Vascular/Lymphatic: Atheromatous calcification. Nodularity in the left groin which is likely tumor within the remnant inguinal canal. Reproductive: Asymmetric heterogeneous thickening of the left ovary measuring 3.3 cm. Other: Large volume ascites with tense abdomen. Omental caking especially below the transverse colon. Nodularity also seen in the pelvic peritoneal recesses. Anasarca. Musculoskeletal: No acute finding Review of the MIP images confirms the above findings. IMPRESSION: 1. Tense  ascites with features of underlying peritoneal carcinomatosis. A primary is not certain, but there is notable asymmetric thickening and heterogeneity of the left ovary (primary versus metastatic deposits). 2. Low volume chest with small pleural effusions and mild atelectasis. Negative for pulmonary embolism. Electronically Signed   By: Jorje Guild M.D.   On: 01/01/2021 10:04   CT ABDOMEN PELVIS W CONTRAST  Result Date: 01/01/2021 CLINICAL DATA:  Acute, nonlocalized abdominal pain. Shortness of breath for 1 week. EXAM: CT ANGIOGRAPHY CHEST CT ABDOMEN AND PELVIS WITH CONTRAST TECHNIQUE: Multidetector CT imaging of the chest was performed using the standard protocol during bolus administration of  intravenous contrast. Multiplanar CT image reconstructions and MIPs were obtained to evaluate the vascular anatomy. Multidetector CT imaging of the abdomen and pelvis was performed using the standard protocol during bolus administration of intravenous contrast. CONTRAST:  128mL OMNIPAQUE IOHEXOL 350 MG/ML SOLN COMPARISON:  Abdominal CT 05/26/2015 FINDINGS: CTA CHEST FINDINGS Cardiovascular: Satisfactory opacification of the pulmonary arteries to the segmental level on second injection, first being severely degraded by bolus dispersion. No evidence of pulmonary embolism. Normal heart size. No pericardial effusion. Coronary atherosclerosis. Mediastinum/Nodes: No mass or adenopathy. Lungs/Pleura: Small pleural effusions on the right more than left with atelectasis. Musculoskeletal: No acute or aggressive finding Review of the MIP images confirms the above findings. CT ABDOMEN and PELVIS FINDINGS Hepatobiliary: Compressed liver due to the size of ascites. Cholelithiasis without acute cholecystitis or ductal dilatation Pancreas: No acute finding.  No inflammation or mass seen Spleen: Negative Adrenals/Urinary Tract: Negative adrenals and kidneys. Largely collapsed bladder. Stomach/Bowel: No obstruction, inflammation, or  visible mass Vascular/Lymphatic: Atheromatous calcification. Nodularity in the left groin which is likely tumor within the remnant inguinal canal. Reproductive: Asymmetric heterogeneous thickening of the left ovary measuring 3.3 cm. Other: Large volume ascites with tense abdomen. Omental caking especially below the transverse colon. Nodularity also seen in the pelvic peritoneal recesses. Anasarca. Musculoskeletal: No acute finding Review of the MIP images confirms the above findings. IMPRESSION: 1. 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 A primary is not certain, but there is notable asymmetric thickening and heterogeneity of the left ovary (primary versus metastatic deposits). 2. Low volume chest with small pleural effusions and mild atelectasis. Negative for pulmonary embolism. Electronically Signed   By: Jorje Guild M.D.   On: 01/01/2021 10:04   US Venous Img Lower Bilateral (DVT)  Result Date: 01/02/2021 CLINICAL DATA:  Leg swelling.  Shortness of breath. EXAM: BILATERAL LOWER EXTREMITY VENOUS DOPPLER ULTRASOUND TECHNIQUE: Gray-scale sonography with graded compression, as well as color Doppler and duplex ultrasound were performed to evaluate the lower extremity deep venous systems from the level of the common femoral vein and including the common femoral, femoral, profunda femoral, popliteal and calf veins including the posterior tibial, peroneal and gastrocnemius veins when visible. The superficial great saphenous vein was also interrogated. Spectral Doppler was utilized to evaluate flow at rest and with distal augmentation maneuvers in the common femoral, femoral and popliteal veins. COMPARISON:  None. FINDINGS: RIGHT LOWER EXTREMITY Common Femoral Vein: No evidence of thrombus. Normal compressibility, respiratory phasicity and response to augmentation. Saphenofemoral Junction: No evidence of thrombus. Normal compressibility and flow on color Doppler imaging. Profunda  Femoral Vein: No evidence of thrombus. Normal compressibility and flow on color Doppler imaging. Femoral Vein: No evidence of thrombus. Normal compressibility, respiratory phasicity and response to augmentation. Popliteal Vein: No evidence of thrombus. Normal compressibility, respiratory phasicity and response to augmentation. Calf Veins: No evidence of thrombus. Normal compressibility and flow on color Doppler imaging. Other Findings: Fluid collection in the right popliteal fossa that measures 4.2 x 0.5 x 2.3 cm. Findings are suggestive for a Baker's cyst. LEFT LOWER EXTREMITY Common Femoral Vein: No evidence of thrombus. Normal compressibility, respiratory phasicity and response to augmentation. Saphenofemoral Junction: No evidence of thrombus. Normal compressibility and flow on color Doppler imaging. Profunda Femoral Vein: No evidence of thrombus. Normal compressibility and flow on color Doppler imaging. Femoral Vein: No evidence of thrombus. Normal compressibility, respiratory phasicity and response to augmentation. Popliteal Vein: No evidence of thrombus. Normal compressibility, respiratory phasicity and response to augmentation. Calf Veins: No  evidence of thrombus. Normal compressibility and flow on color Doppler imaging. IMPRESSION: No evidence of deep venous thrombosis in either lower extremity. Right knee Baker's cyst. Electronically Signed   By: Markus Daft M.D.   On: 01/02/2021 10:00   US Paracentesis  Result Date: 01/01/2021 INDICATION: Patient with suspected peritoneal carcinomatosis presents today with large volume ascites. Interventional radiology asked to perform a diagnostic and therapeutic paracentesis. EXAM: ULTRASOUND GUIDED PARACENTESIS MEDICATIONS: 1% lidocaine 10 mL COMPLICATIONS: None immediate. PROCEDURE: Informed written consent was obtained from the patient after a discussion of the risks, benefits and alternatives to treatment. A timeout was performed prior to the initiation of the  procedure. Initial ultrasound scanning demonstrates a large amount of ascites within the right lower abdominal quadrant. The right lower abdomen was prepped and draped in the usual sterile fashion. 1% lidocaine was used for local anesthesia. Following this, a 19 gauge, 10-cm, Yueh catheter was introduced. An ultrasound image was saved for documentation purposes. The paracentesis was performed. The catheter was removed and a dressing was applied. The patient tolerated the procedure well without immediate post procedural complication. Patient received post-procedure intravenous albumin; see nursing notes for details. FINDINGS: A total of approximately 12 L of amber-colored fluid was removed. Samples were sent to the laboratory as requested by the clinical team. IMPRESSION: Successful ultrasound-guided paracentesis yielding 12 liters of peritoneal fluid. Read by: Soyla Dryer, NP Electronically Signed   By: Albin Felling M.D.   On: 01/01/2021 15:31   DG Abd Portable 2 Views  Result Date: 01/01/2021 CLINICAL DATA:  Abdominal distension EXAM: PORTABLE ABDOMEN - 2 VIEW COMPARISON:  05/26/2015 FINDINGS: Gas and stool throughout the colon with relative sparing of the descending colon, but no convincing obstruction. No concerning mass effect or calcification. Lumbar spine degeneration and mild scoliosis. IMPRESSION: Moderate distension of the colon by gas and stool Electronically Signed   By: Jorje Guild M.D.   On: 01/01/2021 04:03   ECHOCARDIOGRAM COMPLETE  Result Date: 01/03/2021    ECHOCARDIOGRAM REPORT   Patient Name:   MYLIN GIGNAC Date of Exam: 01/02/2021 Medical Rec #:  [id number]         Height:       63.0 in Accession #:    [id number]        Weight:       177.2 lb Date of Birth:  March 04, 1945          BSA:          1.837 m Patient Age:    89 years          BP:           125/53 mmHg Patient Gender: F                 HR:           88 bpm. Exam Location:  [facility] Procedure: 2D Echo Indications:     Dyspnea  R06.00  History:         Patient has no prior history of Echocardiogram examinations.  Sonographer:     Kathlen Brunswick RDCS Referring Phys:  [id number] CHRISTOPHER P DANFORD Diagnosing Phys: Kate Sable MD IMPRESSIONS  1. Left ventricular ejection fraction, by estimation, is 50 to 55%. The left ventricle has low normal function. The left ventricle has no regional wall motion abnormalities. There is mild left ventricular hypertrophy of the basal-septal segment. Left ventricular diastolic parameters are consistent with Grade I diastolic dysfunction (impaired relaxation).  2. Right ventricular  systolic function is normal. The right ventricular size is normal.  3. The mitral valve is normal in structure. No evidence of mitral valve regurgitation.  4. The aortic valve is tricuspid. Aortic valve regurgitation is not visualized. Aortic valve sclerosis/calcification is present, without any evidence of aortic stenosis. FINDINGS  Left Ventricle: Left ventricular ejection fraction, by estimation, is 50 to 55%. The left ventricle has low normal function. The left ventricle has no regional wall motion abnormalities. The left ventricular internal cavity size was normal in size. There is mild left ventricular hypertrophy of the basal-septal segment. Left ventricular diastolic parameters are consistent with Grade I diastolic dysfunction (impaired relaxation). Right Ventricle: The right ventricular size is normal. No increase in right ventricular wall thickness. Right ventricular systolic function is normal. Left Atrium: Left atrial size was normal in size. Right Atrium: Right atrial size was normal in size. Pericardium: There is no evidence of pericardial effusion. Mitral Valve: The mitral valve is normal in structure. No evidence of mitral valve regurgitation. Tricuspid Valve: The tricuspid valve is not well visualized. Tricuspid valve regurgitation is mild. Aortic Valve: The aortic valve is tricuspid. Aortic valve  regurgitation is not visualized. Aortic valve sclerosis/calcification is present, without any evidence of aortic stenosis. Aortic valve peak gradient measures 11.8 mmHg. Pulmonic Valve: The pulmonic valve was not well visualized. Pulmonic valve regurgitation is not visualized. Aorta: The aortic root and ascending aorta are structurally normal, with no evidence of dilitation. IAS/Shunts: No atrial level shunt detected by color flow Doppler.  LEFT VENTRICLE PLAX 2D LVIDd:         4.30 cm      Diastology LVIDs:         2.90 cm      LV e' medial:    6.74 cm/s LV PW:         1.20 cm      LV E/e' medial:  8.3 LV IVS:        1.00 cm      LV e' lateral:   5.66 cm/s LVOT diam:     1.80 cm      LV E/e' lateral: 9.8 LV SV:         40 LV SV Index:   22 LVOT Area:     2.54 cm  LV Volumes (MOD) LV vol d, MOD A2C: 73.4 ml LV vol d, MOD A4C: 126.0 ml LV vol s, MOD A2C: 38.6 ml LV vol s, MOD A4C: 47.7 ml LV SV MOD A2C:     34.8 ml LV SV MOD A4C:     126.0 ml LV SV MOD BP:      55.2 ml RIGHT VENTRICLE RV Basal diam:  2.70 cm RV S prime:     12.00 cm/s TAPSE (M-mode): 1.8 cm LEFT ATRIUM             Index        RIGHT ATRIUM           Index LA diam:        4.00 cm 2.18 cm/m   RA Area:     10.00 cm LA Vol (A2C):   42.7 ml 23.25 ml/m  RA Volume:   19.50 ml  10.62 ml/m LA Vol (A4C):   21.8 ml 11.87 ml/m LA Biplane Vol: 32.6 ml 17.75 ml/m  AORTIC VALVE                 PULMONIC VALVE AV Area (Vmax): 1.41 cm  PV Vmax:       1.07 m/s AV Vmax:        172.00 cm/s  PV Peak grad:  4.6 mmHg AV Peak Grad:   11.8 mmHg LVOT Vmax:      95.30 cm/s LVOT Vmean:     61.200 cm/s LVOT VTI:       0.156 m  AORTA Ao Root diam: 2.40 cm Ao Asc diam:  2.60 cm MITRAL VALVE               TRICUSPID VALVE MV Area (PHT): 3.40 cm    TV Peak grad:   25.7 mmHg MV Decel Time: 223 msec    TV Vmax:        2.53 m/s MV E velocity: 55.70 cm/s MV A velocity: 93.40 cm/s  SHUNTS MV E/A ratio:  0.60        Systemic VTI:  0.16 m                            Systemic Diam:  1.80 cm Kate Sable MD Electronically signed by Kate Sable MD Signature Date/Time: 01/03/2021/10:15:32 AM    Final    IR IMAGING GUIDED PORT INSERTION  Result Date: 01/17/2021 INDICATION: ovarian cancer needs port EXAM: 1. Ultrasound-guided puncture of the right internal jugular vein 2. Placement of a right-sided chest port using fluoroscopic guidance MEDICATIONS: None ANESTHESIA/SEDATION: Moderate (conscious) sedation was employed during this procedure. A total of Versed 2 mg and Fentanyl 100 mcg was administered intravenously. Moderate Sedation Time: 18 minutes. The patient's level of consciousness and vital signs were monitored continuously by radiology nursing throughout the procedure under my direct supervision. FLUOROSCOPY TIME:  Fluoroscopy Time: 0 minutes 18 seconds (1.2 mGy). COMPLICATIONS: None immediate. PROCEDURE: Informed written consent was obtained from the patient after a thorough discussion of the procedural risks, benefits and alternatives. All questions were addressed. Maximal Sterile Barrier Technique was utilized including caps, mask, sterile gowns, sterile gloves, sterile drape, hand hygiene and skin antiseptic. A timeout was performed prior to the initiation of the procedure. The patient was placed supine on the exam table. The right neck and chest was prepped and draped in the standard sterile fashion. A preliminary ultrasound of the right neck was performed and demonstrates a patent right internal jugular vein. A permanent ultrasound image was stored in the electronic medical record. The overlying skin was anesthetized with 1% Lidocaine. Using ultrasound guidance, access was obtained into the right internal jugular vein using a 21 gauge micropuncture set. A wire was advanced into the SVC, a short incision was made at the puncture site, and serial dilatation performed. Next, in an ipsilateral infraclavicular location, an incision was made at the site of the subcutaneous  reservoir. Blunt dissection was used to open a pocket to contain the reservoir. A subcutaneous tunnel was then created from the port site to the puncture site. A(n) 8 Fr single lumen catheter was advanced through the tunnel. The catheter was attached to the port and this was placed in the subcutaneous pocket. Under fluoroscopic guidance, a peel away sheath was placed, and the catheter was trimmed to the appropriate length and was advanced into the central veins. The catheter length is 20 cm. The tip of the catheter lies near the superior cavoatrial junction. The port flushes and aspirates appropriately. The port was flushed and locked with heparinized saline. The port pocket was closed in 2 layers using 3-0 and 4-0 Vicryl/absorbable suture. Dermabond  was also applied to both incisions. The patient tolerated the procedure well and was transferred to recovery in stable condition. IMPRESSION: Successful placement of a right chest port via the right internal jugular vein. The port is ready immediate use. Electronically Signed   By: Albin Felling M.D.   On: 01/17/2021 13:22   IR Paracentesis  Result Date: 01/17/2021 INDICATION: malignant ascites EXAM: ULTRASOUND GUIDED  PARACENTESIS MEDICATIONS: None. COMPLICATIONS: None immediate. PROCEDURE: Informed written consent was obtained from the patient after a discussion of the risks, benefits and alternatives to treatment. A timeout was performed prior to the initiation of the procedure. Initial ultrasound scanning demonstrates a large amount of ascites within the right lower abdominal quadrant. The right lower abdomen was prepped and draped in the usual sterile fashion. 1% lidocaine was used for local anesthesia. Following this, a 19 gauge 10 cm Yueh catheter was introduced. An ultrasound image was saved for documentation purposes. The paracentesis was performed. The catheter was removed and a dressing was applied. The patient tolerated the procedure well without  immediate post procedural complication. FINDINGS: A total of approximately 9.8 L of clear, dark amber ascites fluid was removed. IMPRESSION: Successful ultrasound-guided paracentesis yielding 9.8 L of peritoneal fluid. Electronically Signed   By: Albin Felling M.D.   On: 01/17/2021 13:18    PERFORMANCE STATUS (ECOG) : 1 - Symptomatic but completely ambulatory  Review of Systems Unless otherwise noted, a complete review of systems is negative.  Physical Exam General: NAD Pulmonary: Unlabored Extremities: no edema, no joint deformities Skin: no rashes Neurological: Grossly nonfocal  Assessment and Plan- Patient is a 76 y.o. 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  She received Udenyca injection on 01/20/2021 and has had subsequent bony pain.  Bony pain -secondary to Udenyca.  This appears to be improving on prednisone with use of as needed Norco.  Recommended continued prednisone taper.  I expect that symptoms should improve daily.  Patient plans to speak with Dr. Janese Banks regarding future use of the [facility].  Patient to see Beckey Rutter, NP on 01/28/2021.  We are available to see her sooner in [facility] if needed.      Patient expressed understanding and was in agreement with this plan. She also understands that She can call clinic at any time with any questions, concerns, or complaints.   Thank you for allowing me to participate in the care of this very pleasant patient.   Time Total: 15 minutes  Visit consisted of counseling and education dealing with the complex and emotionally intense issues of symptom management in the setting of serious illness.Greater than 50%  of this time was spent counseling and coordinating care related to the above assessment and plan.  Signed by: Altha Harm, PhD, NP-C

## 2021-01-25 NOTE — Progress Notes (Signed)
1303- Patient reports she is still not feeling well and would like to be seen today. Patient rates pain level as 4 on a 0-10 pain scale and reports the pain is located in her bilateral knees. Patient reports she is taking her own pain medication and it is not due again until 3:15 pm today. MD, Dr. Janese Banks, and NP, [facility], notified. Per MD and NP order: NP, Josh Borders, is adding patient on to his schedule to be evaluated today.  1319- NP, Josh Borders, at chair side to evaluate patient.   1323- Per NP, Josh Borders: no new orders at this time; okay to proceed with scheduled Venofer and MVASI treatment today.

## 2021-01-25 NOTE — Progress Notes (Signed)
REFERRING PROVIDER: Sindy Guadeloupe, MD [place],  [place] [id number]  PRIMARY PROVIDER:  Hamrick, Lorin Mercy, MD  PRIMARY REASON FOR VISIT:  1. Malignant neoplasm of ovary, unspecified laterality ([place])   2. Family history of breast cancer   3. Family history of bladder cancer      HISTORY OF PRESENT ILLNESS:   Kathy Morton, a 76 y.o. female, was seen for a [place] cancer genetics consultation at the request of Dr. Janese Banks due to a personal and family history of cancer.  Kathy Morton presents to clinic today to discuss the possibility of a hereditary predisposition to cancer, genetic testing, and to further clarify her future cancer risks, as well as potential cancer risks for family members.   In 2023, at the age of 30, Kathy Morton was diagnosed with ovarian cancer. She is currently being treated with chemotherapy.   CANCER HISTORY:  Oncology History  Malignant neoplasm of ovary ([place])  01/13/2021 Initial Diagnosis   Malignant neoplasm of ovary ([place])   01/13/2021 Cancer Staging   Staging form: Ovary, Fallopian Tube, and Primary Peritoneal Carcinoma, AJCC 8th Edition - Clinical stage from 01/13/2021: Stage IVB (cTX, cNX, cM1b) - Signed by Sindy Guadeloupe, MD on 01/13/2021    01/18/2021 -  Chemotherapy   Patient is on Treatment Plan : OVARIAN Carboplatin (AUC 6) / Paclitaxel (175) q21d x 6 cycles        RISK FACTORS:  Menarche was at age 8 First live birth at age 34.  OCP use: yes Mammogram within the last year: yes. Number of breast biopsies: 0.  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 [facility])     Past Surgical History:  Procedure Laterality Date   BREAST BIOPSY Left 01/06/13   benign  finding of sclerosing adenosis   COLONOSCOPY WITH PROPOFOL N/A 11/29/2015   Procedure: COLONOSCOPY WITH PROPOFOL;  Surgeon: Jonathon Bellows, MD;  Location: [facility] ENDOSCOPY;  Service: Endoscopy;  Laterality: N/A;   IR IMAGING GUIDED PORT INSERTION  01/17/2021   IR PARACENTESIS  01/17/2021   KNEE SURGERY Left 2010   torn meniscus   TONSILECTOMY, ADENOIDECTOMY, BILATERAL MYRINGOTOMY AND TUBES      Social History   Socioeconomic History   Marital status: Divorced    Spouse name: Not on file   Number of children: Not on file   Years of education: Not on file   Highest education level: Some college, no degree  Occupational History   Occupation: retired   Tobacco Use   Smoking status: Never   Smokeless tobacco: Never  Vaping Use   Vaping Use: Never used  Substance and Sexual Activity   Alcohol use: No   Drug use: No   Sexual activity: Not Currently    Birth control/protection: None  Other Topics Concern   Not on file  Social History Narrative   Worked at the [employer].   Social Determinants of Health   Financial Resource Strain: Not on file  Food Insecurity: Not on file  Transportation Needs: Not on file  Physical Activity: Not on file  Stress:  Not on file  Social Connections: Not on file     FAMILY HISTORY:  We obtained a detailed, 4-generation family history.  Significant diagnoses are listed below: Family History  Problem Relation Age of Onset   Bladder Cancer Mother    Heart attack Father    Breast cancer Maternal Grandmother 80   Kidney disease Neg Hx    Kathy Morton has 3 sons, 49, 16 and 55, no cancers. She has 3 granddaughters and 3 grandsons, no cancers. Kathy Morton does not have siblings.  Kathy Morton mother died in her 83s of bladder cancer, she did not have history of smoking. Patient had 1 maternal uncle, 2 aunts. One aunt did pass from leukemia as a young adult. Maternal grandmother had breast cancer in her 68s and died in her 31s. Grandfather died over  46.  Kathy Morton father died of a heart attack in his early 52s. Patient had 1 paternal uncle, 1 aunt, no cancers. No cancers for paternal grandparents.   Kathy Morton is unaware of previous family history of genetic testing for hereditary cancer risks. Patient's maternal ancestors are of unknown descent, and paternal ancestors are of unknown descent. There is no reported Ashkenazi Jewish ancestry. There is no known consanguinity.    GENETIC COUNSELING ASSESSMENT: Kathy Morton is a 76 y.o. female with a personal and family history of cancer which is somewhat suggestive of a hereditary cancer syndrome and predisposition to cancer. We, therefore, discussed and recommended the following at today's visit.   DISCUSSION: We discussed that approximately 10-20% of ovarian cancer is hereditary. Most cases of hereditary ovarian cancer are associated with BRCA1/BRCA2 genes, although there are other genes associated with hereditary ovarian cancer as well. Cancers and risks are gene specific.  We discussed that testing is beneficial for several reasons including knowing in an individual is a candidate for certain targeted therapies, knowing about other cancer risks, identifying potential screening and risk-reduction options that may be appropriate, and to understand if other family members could be at risk for cancer and allow them to undergo genetic testing.   We reviewed the characteristics, features and inheritance patterns of hereditary cancer syndromes. We also discussed genetic testing, including the appropriate family members to test, the process of testing, insurance coverage and turn-around-time for results. We discussed the implications of a negative, positive and/or variant of uncertain significant result. We recommended Kathy Morton pursue genetic testing for the [facility] Multi-Cancer+RNA gene panel.   The Multi-Cancer Panel + RNA offered by [facility] includes sequencing and/or deletion duplication testing  of the following 84 genes: AIP, ALK, APC, ATM, AXIN2,BAP1,  BARD1, BLM, BMPR1A, BRCA1, BRCA2, BRIP1, CASR, CDC73, CDH1, CDK4, CDKN1B, CDKN1C, CDKN2A (p14ARF), CDKN2A (p16INK4a), CEBPA, CHEK2, CTNNA1, DICER1, DIS3L2, EGFR (c.2369C>T, p.Thr790Met variant only), EPCAM (Deletion/duplication testing only), FH, FLCN, GATA2, GPC3, GREM1 (Promoter region deletion/duplication testing only), HOXB13 (c.251G>A, p.Gly84Glu), HRAS, KIT, MAX, MEN1, MET, MITF (c.952G>A, p.Glu318Lys variant only), MLH1, MSH2, MSH3, MSH6, MUTYH, NBN, NF1, NF2, NTHL1, PALB2, PDGFRA, PHOX2B, PMS2, POLD1, POLE, POT1, PRKAR1A, PTCH1, PTEN, RAD50, RAD51C, RAD51D, RB1, RECQL4, RET, RUNX1, SDHAF2, SDHA (sequence changes only), SDHB, SDHC, SDHD, SMAD4, SMARCA4, SMARCB1, SMARCE1, STK11, SUFU, TERC, TERT, TMEM127, TP53, TSC1, TSC2, VHL, WRN and WT1.  We discussed that genetic testing through Multi-Cancer+RNA will test for hereditary mutations that could explain her diagnosis of cancer.  However, homologous recombination testing (HRD) is genetic testing performed on the tumor that can determine genetic changes that could influence her management such as eligibility for targeted therapies.  HRD testing is performed in tandem with genetic testing, and typically at no additional cost.  We have started HRD through [facility].  Based on Kathy Morton's personal and family history of cancer, she meets medical criteria for genetic testing. Despite that she meets criteria, she may still have an out of pocket cost. We discussed that if her out of pocket cost for testing is over $100, the laboratory will call and confirm whether she wants to proceed with testing.  If the out of pocket cost of testing is less than $100 she will be billed by the genetic testing laboratory.   PLAN: After considering the risks, benefits, and limitations, Kathy Morton provided informed consent to pursue genetic testing and the blood sample was sent to [facility] for analysis  of the Multi-Cancer+RNA panel. Results should be available within approximately 2-3 weeks' time, at which point they will be disclosed by telephone to Kathy Morton, as will any additional recommendations warranted by these results. Kathy Morton will receive a summary of her genetic counseling visit and a copy of her results once available. This information will also be available in Epic.   Kathy Morton questions were answered to her satisfaction today. Our contact information was provided should additional questions or concerns arise. Thank you for the referral and allowing [place] to share in the care of your patient.   Faith Rogue, MS, [facility] Genetic Counselor [email or web address] Phone: [phone]  The patient was seen for a total of 20 minutes in face-to-face genetic counseling in the infusion room.  Patient was seen alone. Dr. Grayland Ormond was available for discussion regarding this case.   _______________________________________________________________________ For Office Staff:  Number of people involved in session: 1 Was an Intern/ student involved with case: no

## 2021-01-27 ENCOUNTER — Encounter
Admission: RE | Admit: 2021-01-27 | Discharge: 2021-01-27 | Disposition: A | Discharge status: Home / Self Care | Payer: Medicare PPO | Source: Ambulatory Visit | Attending: Oncology | Admitting: Oncology

## 2021-01-27 DIAGNOSIS — C763 Malignant neoplasm of pelvis: Secondary | ICD-10-CM | POA: Insufficient documentation

## 2021-01-27 LAB — GLUCOSE, CAPILLARY: Glucose-Capillary: 89 mg/dL (ref 70–99)

## 2021-01-27 MED ORDER — FLUDEOXYGLUCOSE F - 18 (FDG) INJECTION
8.2000 | Freq: Once | INTRAVENOUS | Status: AC | PRN
  Administered 2021-01-27: 8.73 via INTRAVENOUS

## 2021-01-28 ENCOUNTER — Other Ambulatory Visit: Payer: Self-pay

## 2021-01-28 ENCOUNTER — Inpatient Hospital Stay (HOSPITAL_BASED_OUTPATIENT_CLINIC_OR_DEPARTMENT_OTHER): Payer: Medicare PPO | Admitting: Hospice and Palliative Medicine

## 2021-01-28 ENCOUNTER — Inpatient Hospital Stay: Payer: Medicare PPO

## 2021-01-28 ENCOUNTER — Inpatient Hospital Stay: Payer: Medicare PPO | Admitting: Nurse Practitioner

## 2021-01-28 VITALS — BP 164/102 | HR 95 | Temp 97.6°F | Resp 18 | Wt 157.6 lb

## 2021-01-28 DIAGNOSIS — R634 Abnormal weight loss: Secondary | ICD-10-CM

## 2021-01-28 DIAGNOSIS — Z95828 Presence of other vascular implants and grafts: Secondary | ICD-10-CM

## 2021-01-28 DIAGNOSIS — M898X9 Other specified disorders of bone, unspecified site: Secondary | ICD-10-CM | POA: Diagnosis not present

## 2021-01-28 DIAGNOSIS — C801 Malignant (primary) neoplasm, unspecified: Secondary | ICD-10-CM | POA: Diagnosis not present

## 2021-01-28 DIAGNOSIS — C569 Malignant neoplasm of unspecified ovary: Secondary | ICD-10-CM | POA: Diagnosis not present

## 2021-01-28 DIAGNOSIS — Z5112 Encounter for antineoplastic immunotherapy: Secondary | ICD-10-CM | POA: Diagnosis not present

## 2021-01-28 DIAGNOSIS — D519 Vitamin B12 deficiency anemia, unspecified: Secondary | ICD-10-CM | POA: Diagnosis not present

## 2021-01-28 DIAGNOSIS — F419 Anxiety disorder, unspecified: Secondary | ICD-10-CM | POA: Diagnosis not present

## 2021-01-28 DIAGNOSIS — Z515 Encounter for palliative care: Secondary | ICD-10-CM | POA: Diagnosis not present

## 2021-01-28 DIAGNOSIS — E538 Deficiency of other specified B group vitamins: Secondary | ICD-10-CM

## 2021-01-28 DIAGNOSIS — D508 Other iron deficiency anemias: Secondary | ICD-10-CM

## 2021-01-28 DIAGNOSIS — D509 Iron deficiency anemia, unspecified: Secondary | ICD-10-CM | POA: Diagnosis not present

## 2021-01-28 DIAGNOSIS — F411 Generalized anxiety disorder: Secondary | ICD-10-CM

## 2021-01-28 DIAGNOSIS — R188 Other ascites: Secondary | ICD-10-CM | POA: Diagnosis not present

## 2021-01-28 DIAGNOSIS — F4323 Adjustment disorder with mixed anxiety and depressed mood: Secondary | ICD-10-CM | POA: Diagnosis not present

## 2021-01-28 DIAGNOSIS — C786 Secondary malignant neoplasm of retroperitoneum and peritoneum: Secondary | ICD-10-CM | POA: Diagnosis not present

## 2021-01-28 DIAGNOSIS — Z5111 Encounter for antineoplastic chemotherapy: Secondary | ICD-10-CM | POA: Diagnosis not present

## 2021-01-28 LAB — CBC WITH DIFFERENTIAL/PLATELET
Abs Immature Granulocytes: 4.98 10*3/uL — ABNORMAL HIGH (ref 0.00–0.07)
Basophils Absolute: 0.1 10*3/uL (ref 0.0–0.1)
Basophils Relative: 0 %
Eosinophils Absolute: 0 10*3/uL (ref 0.0–0.5)
Eosinophils Relative: 0 %
HCT: 29.8 % — ABNORMAL LOW (ref 36.0–46.0)
Hemoglobin: 10.6 g/dL — ABNORMAL LOW (ref 12.0–15.0)
Immature Granulocytes: 11 %
Lymphocytes Relative: 5 %
Lymphs Abs: 2.3 10*3/uL (ref 0.7–4.0)
MCH: 31.8 pg (ref 26.0–34.0)
MCHC: 35.6 g/dL (ref 30.0–36.0)
MCV: 89.5 fL (ref 80.0–100.0)
Monocytes Absolute: 2.4 10*3/uL — ABNORMAL HIGH (ref 0.1–1.0)
Monocytes Relative: 5 %
Neutro Abs: 34.8 10*3/uL — ABNORMAL HIGH (ref 1.7–7.7)
Neutrophils Relative %: 79 %
Platelets: 294 10*3/uL (ref 150–400)
RBC: 3.33 MIL/uL — ABNORMAL LOW (ref 3.87–5.11)
RDW: 20.3 % — ABNORMAL HIGH (ref 11.5–15.5)
Smear Review: NORMAL
WBC: 44.5 10*3/uL — ABNORMAL HIGH (ref 4.0–10.5)
nRBC: 1 % — ABNORMAL HIGH (ref 0.0–0.2)

## 2021-01-28 LAB — COMPREHENSIVE METABOLIC PANEL
ALT: 50 U/L — ABNORMAL HIGH (ref 0–44)
AST: 35 U/L (ref 15–41)
Albumin: 3.7 g/dL (ref 3.5–5.0)
Alkaline Phosphatase: 178 U/L — ABNORMAL HIGH (ref 38–126)
Anion gap: 11 (ref 5–15)
BUN: 29 mg/dL — ABNORMAL HIGH (ref 8–23)
CO2: 23 mmol/L (ref 22–32)
Calcium: 9.2 mg/dL (ref 8.9–10.3)
Chloride: 95 mmol/L — ABNORMAL LOW (ref 98–111)
Creatinine, Ser: 0.48 mg/dL (ref 0.44–1.00)
GFR, Estimated: >60 mL/min (ref >60)
Glucose, Bld: 134 mg/dL — ABNORMAL HIGH (ref 70–99)
Potassium: 4 mmol/L (ref 3.5–5.1)
Sodium: 129 mmol/L — ABNORMAL LOW (ref 135–145)
Total Bilirubin: 0.3 mg/dL (ref 0.3–1.2)
Total Protein: 7.1 g/dL (ref 6.5–8.1)

## 2021-01-28 LAB — PROTEIN, URINE, RANDOM: Total Protein, Urine: 49 mg/dL

## 2021-01-28 MED ORDER — SODIUM CHLORIDE 0.9% FLUSH
10.0000 mL | Freq: Once | INTRAVENOUS | Status: AC
  Administered 2021-01-28: 10 mL via INTRAVENOUS
  Filled 2021-01-28: qty 10

## 2021-01-28 MED ORDER — SODIUM CHLORIDE 0.9 % IV SOLN: 200.0000 mg | INTRAVENOUS | Status: DC

## 2021-01-28 MED ORDER — DULOXETINE HCL 30 MG PO CPEP: 30.0000 mg | ORAL_CAPSULE | Freq: Every day | ORAL | 3 refills | Status: DC

## 2021-01-28 MED ORDER — SODIUM CHLORIDE 0.9 % IV SOLN
Freq: Once | INTRAVENOUS | Status: AC
  Filled 2021-01-28: qty 250

## 2021-01-28 MED ORDER — HEPARIN SOD (PORK) LOCK FLUSH 100 UNIT/ML IV SOLN
500.0000 [IU] | Freq: Once | INTRAVENOUS | Status: AC | PRN
  Administered 2021-01-28: 500 [IU]
  Filled 2021-01-28: qty 5

## 2021-01-28 MED ORDER — IRON SUCROSE 20 MG/ML IV SOLN
200.0000 mg | Freq: Once | INTRAVENOUS | Status: AC
  Administered 2021-01-28: 200 mg via INTRAVENOUS
  Filled 2021-01-28: qty 10

## 2021-01-28 MED ORDER — CYANOCOBALAMIN 1000 MCG/ML IJ SOLN
1000.0000 ug | INTRAMUSCULAR | Status: DC
  Administered 2021-01-28: 1000 ug via INTRAMUSCULAR
  Filled 2021-01-28: qty 1

## 2021-01-28 NOTE — Progress Notes (Signed)
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  She had CT angio chest which did not show any evidence of PE.  Low volume chest with small pleural effusions.  CT abdomen and pelvis with contrast showed tense ascites with features of underlying peritoneal carcinomatosis.  Asymmetric thickening and heterogeneity of the left ovary raising the concern for potential ovarian cancer.  Nodularity in the left groin which is likely tumor within the remnant inguinal canal.  Cytology consistent with high-grade serous carcinoma.The carcinoma is positive for PAX-8 and WT-1. Rare cells demonstrate  weak ER positivity. The cytomorphologic findings in conjunction with the  pattern of immunoreactivity are consistent with the above diagnosis. CA125 elevated at 2336.  Interval history- Patient received D1C1 of carbo-taxol with udencyca on D3 and avastin on D9. She was found to be iron deficient and has started IV venofer. She had significant pain after receiving her udencya injection. Saw palliative care and was given steroids and vicodin. She is reluctant to take pain medication d/t concerns of dependency. She continues to have significant anxiety. Previously self discontinued prozac d/t concerns of pill burden. Was advised not to take klonopin with vicodin. Fatigue is  most bothersome. She is very concerned about relying on her sons for assistance at home and likes to remain independent. She has been drinking ensure shakes. Does not feel her ascites has reaccumulated.   ECOG PS- 2 Pain scale- 0   Review of systems- Review of Systems  Constitutional:  Positive for malaise/fatigue and weight loss. Negative for chills and fever.  HENT:  Negative for congestion, ear discharge and nosebleeds.   Eyes:  Negative for blurred vision.  Respiratory:  Positive for shortness of breath. Negative for cough, hemoptysis, sputum production and wheezing.   Cardiovascular:  Negative for chest pain, palpitations, orthopnea and claudication.  Gastrointestinal:  Negative for abdominal pain, blood in stool, constipation, diarrhea, heartburn, melena, nausea and vomiting.  Genitourinary:  Negative for dysuria, flank pain, frequency, hematuria and urgency.  Musculoskeletal:  Positive for joint pain and myalgias. Negative for back pain.  Skin:  Negative for rash.  Neurological:  Positive for tingling and weakness. Negative for dizziness, focal weakness, seizures and headaches.  Endo/Heme/Allergies:  Does not bruise/bleed easily.  Psychiatric/Behavioral:  Positive for depression. Negative for suicidal ideas. The patient is nervous/anxious. The patient does not have insomnia.      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 [facility])     Past Surgical History:  Procedure Laterality Date   BREAST BIOPSY Left 01/06/13   benign finding of sclerosing adenosis   COLONOSCOPY WITH PROPOFOL N/A 11/29/2015   Procedure: COLONOSCOPY WITH PROPOFOL;  Surgeon: Jonathon Bellows, MD;  Location: [facility]  ENDOSCOPY;  Service: Endoscopy;  Laterality: N/A;   IR IMAGING GUIDED PORT INSERTION  01/17/2021   IR PARACENTESIS  01/17/2021   KNEE SURGERY Left 2010   torn meniscus   TONSILECTOMY, ADENOIDECTOMY, BILATERAL MYRINGOTOMY AND TUBES      Social History   Socioeconomic History   Marital status: Divorced    Spouse name: Not on file   Number of children: Not on file   Years of education: Not on file   Highest education level: Some college, no degree  Occupational History   Occupation: retired   Tobacco Use   Smoking status: Never   Smokeless tobacco: Never  Vaping Use   Vaping Use: Never used  Substance and Sexual Activity   Alcohol use: No   Drug use: No   Sexual activity: Not Currently    Birth control/protection: None  Other Topics Concern   Not on file  Social History Narrative   Worked at the [employer].   Social Determinants of Health   Financial Resource Strain: Not on file  Food Insecurity: Not on file  Transportation Needs: Not on file  Physical Activity: Not on file  Stress: Not on file  Social Connections: Not on file  Intimate Partner Violence: Not on file    Family History  Problem Relation Age of Onset   Bladder Cancer Mother    Heart attack Father    Breast cancer Maternal Grandmother 80   Kidney disease Neg Hx      Current Outpatient Medications:    alendronate (FOSAMAX) 70 MG tablet, Take 70 mg by mouth once a week. Take with a full glass of water on an empty stomach. (Patient not taking: Reported on 01/18/2021), Disp: , Rfl:    apixaban (ELIQUIS) 2.5 MG TABS tablet, Take 1 tablet (2.5 mg total) by mouth 2 (two) times daily., Disp: 60 tablet, Rfl: 3   atorvastatin (LIPITOR) 20 MG tablet, TAKE 1 TABLET BY MOUTH EVERY DAY, Disp: 90 tablet, Rfl: 0   clonazePAM (KLONOPIN) 1 MG tablet, Take 1 mg by mouth daily as needed for anxiety., Disp: , Rfl:    dexamethasone (DECADRON) 4 MG tablet, Take 2 tablets (8 mg total) by mouth daily. Start the day  after carboplatin chemotherapy for 3 days., Disp: 30 tablet, Rfl: 1   FLUoxetine (PROZAC) 40 MG capsule, Take 40 mg by mouth daily. (Patient not taking: Reported on 01/12/2021), Disp: , Rfl:    folic acid (FOLVITE) 1 MG tablet, Take 1 tablet (1 mg total) by mouth daily., Disp: 30 tablet, Rfl: 2   HYDROcodone-acetaminophen (NORCO) 5-325 MG tablet, Take 1-2 tablets by mouth every 4 (four) hours as needed for moderate pain., Disp: 45 tablet, Rfl: 0   lidocaine-prilocaine (EMLA) cream, Apply to affected area once, Disp: 30 g, Rfl: 3   LORazepam (ATIVAN) 0.5 MG tablet, Take 1 tablet (0.5 mg total) by mouth every 6 (six) hours as needed (Nausea or vomiting)., Disp: 30 tablet, Rfl: 0   ondansetron (ZOFRAN) 8 MG tablet, Take 1 tablet (8 mg total) by mouth 2 (two) times daily as needed for refractory nausea / vomiting. Start on day 3 after carboplatin chemo. (Patient not taking: Reported on 01/18/2021), Disp: 30 tablet, Rfl: 1   polyethylene glycol powder (GLYCOLAX/MIRALAX) 17 GM/SCOOP powder,  Daily as needed for constipation, Disp: 255 g, Rfl: 3   predniSONE (STERAPRED UNI-PAK 21 TAB) 10 MG (21) TBPK tablet, Take 6 tablets (72m) x 1 day, then take 5 tablets (542m x 1 day, then take 4 tablets (4050mx 1 day, then take 3 tablets (26m58m 1 day, then take 2 tablets (20mg25m1 day, then take 1 tablet (10mg)53m day, then stop, Disp: 21 tablet, Rfl: 0   prochlorperazine (COMPAZINE) 10 MG tablet, Take 1 tablet (10 mg total) by mouth every 6 (six) hours as needed (Nausea or vomiting). (Patient not taking: Reported on 01/18/2021), Disp: 30 tablet, Rfl: 1   QUEtiapine (SEROQUEL) 100 MG tablet, Take 1 tablet (100 mg total) by mouth at bedtime., Disp: 90 tablet, Rfl: 1   SUMAtriptan (IMITREX) 100 MG tablet, May repeat in 2 hours if headache persists or recurs., Disp: 9 tablet, Rfl: 5 No current facility-administered medications for this visit.  Facility-Administered Medications Ordered in Other Visits:    heparin lock  flush 100 UNIT/ML injection, , , ,   Physical exam:  Vitals:   01/28/21 0911  BP: (!) 164/102  Pulse: 95  Resp: 18  Temp: 97.6 F (36.4 C)  SpO2: 98%  Weight: 157 lb 9.6 oz (71.5 kg)   Physical Exam Constitutional:      General: She is not in acute distress. Cardiovascular:     Rate and Rhythm: Normal rate and regular rhythm.     Heart sounds: Normal heart sounds.  Pulmonary:     Effort: Pulmonary effort is normal.     Breath sounds: Normal breath sounds.  Abdominal:     General: Bowel sounds are normal.     Palpations: Abdomen is soft.  Skin:    General: Skin is warm and dry.  Neurological:     Mental Status: She is alert and oriented to person, place, and time.    CMP Latest Ref Rng & Units 01/18/2021  Glucose 70 - 99 mg/dL 112(H)  BUN 8 - 23 mg/dL 17  Creatinine 0.44 - 1.00 mg/dL 0.47  Sodium 135 - 145 mmol/L 134(L)  Potassium 3.5 - 5.1 mmol/L 3.5  Chloride 98 - 111 mmol/L 101  CO2 22 - 32 mmol/L 26  Calcium 8.9 - 10.3 mg/dL 8.2(L)  Total Protein 6.5 - 8.1 g/dL 5.8(L)  Total Bilirubin 0.3 - 1.2 mg/dL 0.4  Alkaline Phos 38 - 126 U/L 66  AST 15 - 41 U/L 23  ALT 0 - 44 U/L 15   CBC Latest Ref Rng & Units 01/18/2021  WBC 4.0 - 10.5 K/uL 8.2  Hemoglobin 12.0 - 15.0 g/dL 10.2(L)  Hematocrit 36.0 - 46.0 % 32.1(L)  Platelets 150 - 400 K/uL 565(H)   Iron/TIBC/Ferritin/ %Sat    Component Value Date/Time   IRON 33 01/01/2021 1239   TIBC 218 (L) 01/01/2021 1239   FERRITIN 354 (H) 01/01/2021 1239   IRONPCTSAT 15 01/01/2021 1239    No images are attached to the encounter.  DG Chest 2 View  Result Date: 01/01/2021 CLINICAL DATA:  Shortness of breath. EXAM: CHEST - 2 VIEW COMPARISON:  PA Lat 12/30/2020. FINDINGS: The cardiac size is normal. There is mild aortic uncoiling, small amount of calcification. There are small pleural effusions collecting posteriorly, which was seen previously. The lungs are expiratory with very limited visualization of the lower lung fields.  The visualized aerated lungs are clear. Osteopenia. IMPRESSION: Limited expiratory study. The lower lung fields are poorly evaluated. Visualized aerated lungs are  clear. There are small pleural effusions. Overall aeration seems unchanged. Electronically Signed   By: Telford Nab M.D.   On: 01/01/2021 02:50   DG Chest 2 View  Result Date: 12/30/2020 CLINICAL DATA:  Shortness of breath with exertion for the past 2 weeks. EXAM: CHEST - 2 VIEW COMPARISON:  None. FINDINGS: The heart size and mediastinal contours are within normal limits. Normal pulmonary vascularity. Low lung volumes. Trace bilateral pleural effusions. No consolidation or pneumothorax. No acute osseous abnormality. IMPRESSION: 1. Low lung volumes with trace bilateral pleural effusions. Electronically Signed   By: Titus Dubin M.D.   On: 12/30/2020 12:42   CT Angio Chest PE W and/or Wo Contrast  Result Date: 01/01/2021 CLINICAL DATA:  Acute, nonlocalized abdominal pain. Shortness of breath for 1 week. EXAM: CT ANGIOGRAPHY CHEST CT ABDOMEN AND PELVIS WITH CONTRAST TECHNIQUE: Multidetector CT imaging of the chest was performed using the standard protocol during bolus administration of intravenous contrast. Multiplanar CT image reconstructions and MIPs were obtained to evaluate the vascular anatomy. Multidetector CT imaging of the abdomen and pelvis was performed using the standard protocol during bolus administration of intravenous contrast. CONTRAST:  169m OMNIPAQUE IOHEXOL 350 MG/ML SOLN COMPARISON:  Abdominal CT 05/26/2015 FINDINGS: CTA CHEST FINDINGS Cardiovascular: Satisfactory opacification of the pulmonary arteries to the segmental level on second injection, first being severely degraded by bolus dispersion. No evidence of pulmonary embolism. Normal heart size. No pericardial effusion. Coronary atherosclerosis. Mediastinum/Nodes: No mass or adenopathy. Lungs/Pleura: Small pleural effusions on the right more than left with atelectasis.  Musculoskeletal: No acute or aggressive finding Review of the MIP images confirms the above findings. CT ABDOMEN and PELVIS FINDINGS Hepatobiliary: Compressed liver due to the size of ascites. Cholelithiasis without acute cholecystitis or ductal dilatation Pancreas: No acute finding.  No inflammation or mass seen Spleen: Negative Adrenals/Urinary Tract: Negative adrenals and kidneys. Largely collapsed bladder. Stomach/Bowel: No obstruction, inflammation, or visible mass Vascular/Lymphatic: Atheromatous calcification. Nodularity in the left groin which is likely tumor within the remnant inguinal canal. Reproductive: Asymmetric heterogeneous thickening of the left ovary measuring 3.3 cm. Other: Large volume ascites with tense abdomen. Omental caking especially below the transverse colon. Nodularity also seen in the pelvic peritoneal recesses. Anasarca. Musculoskeletal: No acute finding Review of the MIP images confirms the above findings. IMPRESSION: 1. 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 A primary is not certain, but there is notable asymmetric thickening and heterogeneity of the left ovary (primary versus metastatic deposits). 2. Low volume chest with small pleural effusions and mild atelectasis. Negative for pulmonary embolism. Electronically Signed   By: JJorje GuildM.D.   On: 01/01/2021 10:04   CT ABDOMEN PELVIS W CONTRAST  Result Date: 01/01/2021 CLINICAL DATA:  Acute, nonlocalized abdominal pain. Shortness of breath for 1 week. EXAM: CT ANGIOGRAPHY CHEST CT ABDOMEN AND PELVIS WITH CONTRAST TECHNIQUE: Multidetector CT imaging of the chest was performed using the standard protocol during bolus administration of intravenous contrast. Multiplanar CT image reconstructions and MIPs were obtained to evaluate the vascular anatomy. Multidetector CT imaging of the abdomen and pelvis was performed using the standard protocol during bolus administration of intravenous contrast.  CONTRAST:  1081mOMNIPAQUE IOHEXOL 350 MG/ML SOLN COMPARISON:  Abdominal CT 05/26/2015 FINDINGS: CTA CHEST FINDINGS Cardiovascular: Satisfactory opacification of the pulmonary arteries to the segmental level on second injection, first being severely degraded by bolus dispersion. No evidence of pulmonary embolism. Normal heart size. No pericardial effusion. Coronary atherosclerosis. Mediastinum/Nodes: No mass or adenopathy. Lungs/Pleura: Small pleural  effusions on the right more than left with atelectasis. Musculoskeletal: No acute or aggressive finding Review of the MIP images confirms the above findings. CT ABDOMEN and PELVIS FINDINGS Hepatobiliary: Compressed liver due to the size of ascites. Cholelithiasis without acute cholecystitis or ductal dilatation Pancreas: No acute finding.  No inflammation or mass seen Spleen: Negative Adrenals/Urinary Tract: Negative adrenals and kidneys. Largely collapsed bladder. Stomach/Bowel: No obstruction, inflammation, or visible mass Vascular/Lymphatic: Atheromatous calcification. Nodularity in the left groin which is likely tumor within the remnant inguinal canal. Reproductive: Asymmetric heterogeneous thickening of the left ovary measuring 3.3 cm. Other: Large volume ascites with tense abdomen. Omental caking especially below the transverse colon. Nodularity also seen in the pelvic peritoneal recesses. Anasarca. Musculoskeletal: No acute finding Review of the MIP images confirms the above findings. IMPRESSION: 1. 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 A primary is not certain, but there is notable asymmetric thickening and heterogeneity of the left ovary (primary versus metastatic deposits). 2. Low volume chest with small pleural effusions and mild atelectasis. Negative for pulmonary embolism. Electronically Signed   By: Jorje Guild M.D.   On: 01/01/2021 10:04   US Venous Img Lower Bilateral (DVT)  Result Date: 01/02/2021 CLINICAL DATA:   Leg swelling.  Shortness of breath. EXAM: BILATERAL LOWER EXTREMITY VENOUS DOPPLER ULTRASOUND TECHNIQUE: Gray-scale sonography with graded compression, as well as color Doppler and duplex ultrasound were performed to evaluate the lower extremity deep venous systems from the level of the common femoral vein and including the common femoral, femoral, profunda femoral, popliteal and calf veins including the posterior tibial, peroneal and gastrocnemius veins when visible. The superficial great saphenous vein was also interrogated. Spectral Doppler was utilized to evaluate flow at rest and with distal augmentation maneuvers in the common femoral, femoral and popliteal veins. COMPARISON:  None. FINDINGS: RIGHT LOWER EXTREMITY Common Femoral Vein: No evidence of thrombus. Normal compressibility, respiratory phasicity and response to augmentation. Saphenofemoral Junction: No evidence of thrombus. Normal compressibility and flow on color Doppler imaging. Profunda Femoral Vein: No evidence of thrombus. Normal compressibility and flow on color Doppler imaging. Femoral Vein: No evidence of thrombus. Normal compressibility, respiratory phasicity and response to augmentation. Popliteal Vein: No evidence of thrombus. Normal compressibility, respiratory phasicity and response to augmentation. Calf Veins: No evidence of thrombus. Normal compressibility and flow on color Doppler imaging. Other Findings: Fluid collection in the right popliteal fossa that measures 4.2 x 0.5 x 2.3 cm. Findings are suggestive for a Baker's cyst. LEFT LOWER EXTREMITY Common Femoral Vein: No evidence of thrombus. Normal compressibility, respiratory phasicity and response to augmentation. Saphenofemoral Junction: No evidence of thrombus. Normal compressibility and flow on color Doppler imaging. Profunda Femoral Vein: No evidence of thrombus. Normal compressibility and flow on color Doppler imaging. Femoral Vein: No evidence of thrombus. Normal  compressibility, respiratory phasicity and response to augmentation. Popliteal Vein: No evidence of thrombus. Normal compressibility, respiratory phasicity and response to augmentation. Calf Veins: No evidence of thrombus. Normal compressibility and flow on color Doppler imaging. IMPRESSION: No evidence of deep venous thrombosis in either lower extremity. Right knee Baker's cyst. Electronically Signed   By: Markus Daft M.D.   On: 01/02/2021 10:00   US Paracentesis  Result Date: 01/01/2021 INDICATION: Patient with suspected peritoneal carcinomatosis presents today with large volume ascites. Interventional radiology asked to perform a diagnostic and therapeutic paracentesis. EXAM: ULTRASOUND GUIDED PARACENTESIS MEDICATIONS: 1% lidocaine 10 mL COMPLICATIONS: None immediate. PROCEDURE: Informed written consent was obtained from the patient after a discussion  of the risks, benefits and alternatives to treatment. A timeout was performed prior to the initiation of the procedure. Initial ultrasound scanning demonstrates a large amount of ascites within the right lower abdominal quadrant. The right lower abdomen was prepped and draped in the usual sterile fashion. 1% lidocaine was used for local anesthesia. Following this, a 19 gauge, 10-cm, Yueh catheter was introduced. An ultrasound image was saved for documentation purposes. The paracentesis was performed. The catheter was removed and a dressing was applied. The patient tolerated the procedure well without immediate post procedural complication. Patient received post-procedure intravenous albumin; see nursing notes for details. FINDINGS: A total of approximately 12 L of amber-colored fluid was removed. Samples were sent to the laboratory as requested by the clinical team. IMPRESSION: Successful ultrasound-guided paracentesis yielding 12 liters of peritoneal fluid. Read by: Soyla Dryer, NP Electronically Signed   By: Albin Felling M.D.   On: 01/01/2021 15:31    DG Abd Portable 2 Views  Result Date: 01/01/2021 CLINICAL DATA:  Abdominal distension EXAM: PORTABLE ABDOMEN - 2 VIEW COMPARISON:  05/26/2015 FINDINGS: Gas and stool throughout the colon with relative sparing of the descending colon, but no convincing obstruction. No concerning mass effect or calcification. Lumbar spine degeneration and mild scoliosis. IMPRESSION: Moderate distension of the colon by gas and stool Electronically Signed   By: Jorje Guild M.D.   On: 01/01/2021 04:03   ECHOCARDIOGRAM COMPLETE  Result Date: 01/03/2021    ECHOCARDIOGRAM REPORT   Patient Name:   NIMA BAMBURG Date of Exam: 01/02/2021 Medical Rec #:  [id number]         Height:       63.0 in Accession #:    [id number]        Weight:       177.2 lb Date of Birth:  1945-05-19          BSA:          1.837 m Patient Age:    66 years          BP:           125/53 mmHg Patient Gender: F                 HR:           88 bpm. Exam Location:  [facility] Procedure: 2D Echo Indications:     Dyspnea R06.00  History:         Patient has no prior history of Echocardiogram examinations.  Sonographer:     Kathlen Brunswick RDCS Referring Phys:  [id number] CHRISTOPHER P DANFORD Diagnosing Phys: Kate Sable MD IMPRESSIONS  1. Left ventricular ejection fraction, by estimation, is 50 to 55%. The left ventricle has low normal function. The left ventricle has no regional wall motion abnormalities. There is mild left ventricular hypertrophy of the basal-septal segment. Left ventricular diastolic parameters are consistent with Grade I diastolic dysfunction (impaired relaxation).  2. Right ventricular systolic function is normal. The right ventricular size is normal.  3. The mitral valve is normal in structure. No evidence of mitral valve regurgitation.  4. The aortic valve is tricuspid. Aortic valve regurgitation is not visualized. Aortic valve sclerosis/calcification is present, without any evidence of aortic stenosis. FINDINGS  Left Ventricle: Left  ventricular ejection fraction, by estimation, is 50 to 55%. The left ventricle has low normal function. The left ventricle has no regional wall motion abnormalities. The left ventricular internal cavity size was normal in size. There is mild left ventricular  hypertrophy of the basal-septal segment. Left ventricular diastolic parameters are consistent with Grade I diastolic dysfunction (impaired relaxation). Right Ventricle: The right ventricular size is normal. No increase in right ventricular wall thickness. Right ventricular systolic function is normal. Left Atrium: Left atrial size was normal in size. Right Atrium: Right atrial size was normal in size. Pericardium: There is no evidence of pericardial effusion. Mitral Valve: The mitral valve is normal in structure. No evidence of mitral valve regurgitation. Tricuspid Valve: The tricuspid valve is not well visualized. Tricuspid valve regurgitation is mild. Aortic Valve: The aortic valve is tricuspid. Aortic valve regurgitation is not visualized. Aortic valve sclerosis/calcification is present, without any evidence of aortic stenosis. Aortic valve peak gradient measures 11.8 mmHg. Pulmonic Valve: The pulmonic valve was not well visualized. Pulmonic valve regurgitation is not visualized. Aorta: The aortic root and ascending aorta are structurally normal, with no evidence of dilitation. IAS/Shunts: No atrial level shunt detected by color flow Doppler.  LEFT VENTRICLE PLAX 2D LVIDd:         4.30 cm      Diastology LVIDs:         2.90 cm      LV e' medial:    6.74 cm/s LV PW:         1.20 cm      LV E/e' medial:  8.3 LV IVS:        1.00 cm      LV e' lateral:   5.66 cm/s LVOT diam:     1.80 cm      LV E/e' lateral: 9.8 LV SV:         40 LV SV Index:   22 LVOT Area:     2.54 cm  LV Volumes (MOD) LV vol d, MOD A2C: 73.4 ml LV vol d, MOD A4C: 126.0 ml LV vol s, MOD A2C: 38.6 ml LV vol s, MOD A4C: 47.7 ml LV SV MOD A2C:     34.8 ml LV SV MOD A4C:     126.0 ml LV SV MOD BP:       55.2 ml RIGHT VENTRICLE RV Basal diam:  2.70 cm RV S prime:     12.00 cm/s TAPSE (M-mode): 1.8 cm LEFT ATRIUM             Index        RIGHT ATRIUM           Index LA diam:        4.00 cm 2.18 cm/m   RA Area:     10.00 cm LA Vol (A2C):   42.7 ml 23.25 ml/m  RA Volume:   19.50 ml  10.62 ml/m LA Vol (A4C):   21.8 ml 11.87 ml/m LA Biplane Vol: 32.6 ml 17.75 ml/m  AORTIC VALVE                 PULMONIC VALVE AV Area (Vmax): 1.41 cm     PV Vmax:       1.07 m/s AV Vmax:        172.00 cm/s  PV Peak grad:  4.6 mmHg AV Peak Grad:   11.8 mmHg LVOT Vmax:      95.30 cm/s LVOT Vmean:     61.200 cm/s LVOT VTI:       0.156 m  AORTA Ao Root diam: 2.40 cm Ao Asc diam:  2.60 cm MITRAL VALVE               TRICUSPID VALVE MV  Area (PHT): 3.40 cm    TV Peak grad:   25.7 mmHg MV Decel Time: 223 msec    TV Vmax:        2.53 m/s MV E velocity: 55.70 cm/s MV A velocity: 93.40 cm/s  SHUNTS MV E/A ratio:  0.60        Systemic VTI:  0.16 m                            Systemic Diam: 1.80 cm Kate Sable MD Electronically signed by Kate Sable MD Signature Date/Time: 01/03/2021/10:15:32 AM    Final    IR IMAGING GUIDED PORT INSERTION  Result Date: 01/17/2021 INDICATION: ovarian cancer needs port EXAM: 1. Ultrasound-guided puncture of the right internal jugular vein 2. Placement of a right-sided chest port using fluoroscopic guidance MEDICATIONS: None ANESTHESIA/SEDATION: Moderate (conscious) sedation was employed during this procedure. A total of Versed 2 mg and Fentanyl 100 mcg was administered intravenously. Moderate Sedation Time: 18 minutes. The patient's level of consciousness and vital signs were monitored continuously by radiology nursing throughout the procedure under my direct supervision. FLUOROSCOPY TIME:  Fluoroscopy Time: 0 minutes 18 seconds (1.2 mGy). COMPLICATIONS: None immediate. PROCEDURE: Informed written consent was obtained from the patient after a thorough discussion of the procedural risks, benefits  and alternatives. All questions were addressed. Maximal Sterile Barrier Technique was utilized including caps, mask, sterile gowns, sterile gloves, sterile drape, hand hygiene and skin antiseptic. A timeout was performed prior to the initiation of the procedure. The patient was placed supine on the exam table. The right neck and chest was prepped and draped in the standard sterile fashion. A preliminary ultrasound of the right neck was performed and demonstrates a patent right internal jugular vein. A permanent ultrasound image was stored in the electronic medical record. The overlying skin was anesthetized with 1% Lidocaine. Using ultrasound guidance, access was obtained into the right internal jugular vein using a 21 gauge micropuncture set. A wire was advanced into the SVC, a short incision was made at the puncture site, and serial dilatation performed. Next, in an ipsilateral infraclavicular location, an incision was made at the site of the subcutaneous reservoir. Blunt dissection was used to open a pocket to contain the reservoir. A subcutaneous tunnel was then created from the port site to the puncture site. A(n) 8 Fr single lumen catheter was advanced through the tunnel. The catheter was attached to the port and this was placed in the subcutaneous pocket. Under fluoroscopic guidance, a peel away sheath was placed, and the catheter was trimmed to the appropriate length and was advanced into the central veins. The catheter length is 20 cm. The tip of the catheter lies near the superior cavoatrial junction. The port flushes and aspirates appropriately. The port was flushed and locked with heparinized saline. The port pocket was closed in 2 layers using 3-0 and 4-0 Vicryl/absorbable suture. Dermabond was also applied to both incisions. The patient tolerated the procedure well and was transferred to recovery in stable condition. IMPRESSION: Successful placement of a right chest port via the right internal jugular  vein. The port is ready immediate use. Electronically Signed   By: Albin Felling M.D.   On: 01/17/2021 13:22   IR Paracentesis  Result Date: 01/17/2021 INDICATION: malignant ascites EXAM: ULTRASOUND GUIDED  PARACENTESIS MEDICATIONS: None. COMPLICATIONS: None immediate. PROCEDURE: Informed written consent was obtained from the patient after a discussion of the risks, benefits and alternatives to  treatment. A timeout was performed prior to the initiation of the procedure. Initial ultrasound scanning demonstrates a large amount of ascites within the right lower abdominal quadrant. The right lower abdomen was prepped and draped in the usual sterile fashion. 1% lidocaine was used for local anesthesia. Following this, a 19 gauge 10 cm Yueh catheter was introduced. An ultrasound image was saved for documentation purposes. The paracentesis was performed. The catheter was removed and a dressing was applied. The patient tolerated the procedure well without immediate post procedural complication. FINDINGS: A total of approximately 9.8 L of clear, dark amber ascites fluid was removed. IMPRESSION: Successful ultrasound-guided paracentesis yielding 9.8 L of peritoneal fluid. Electronically Signed   By: Albin Felling M.D.   On: 01/17/2021 13:18     Assessment and plan- Patient is a 76 y.o. 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    Pain associated with GCSF- reviewed rationale for udenyca, however, pain suffered significant pain afterwards requiring her to take narcotic pain medication and steroids. She declines taking udencya with her next cycle of treatment .   Anxiety associated with cancer diagnosis- reviewed clinical findings with palliative care and her significant anxiety. Recommend restarting an SSRI, consider cymbalta given her pain. She has been receiving  clonazepam 1 mg from her pcp for daily use. She also received lorazepam 0.5 mg from oncology. Per patient, she is currently taking the lorazepam and is holding the clonazepam. Reviewed with palliative care that it is ok for her to take ativan with vicodin but cautioned re: drowsiness, lethargy. Merrily Pew will speak to patient today and follow her with palliative care. She is agreeable.   Iron Deficiency Anemia- proceed with venofer today  B12 deficiency- proceed with b12 today and monthly.   Depression- discussed with palliative care. Consider counseling.   Weight loss- 13 lb weight loss in 2 weeks. Some of which is fluid related to ascites. Continue protein rich shakes and small frequent meals. IV fluids today. She is scheduled to see Jennet Maduro, on 2/7.   Elevated LFTs- elevated alk phos and ALT. Monitor.    Visit Diagnosis 1. 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 Anxiety associated with cancer diagnosis ([place])   3. Iron deficiency anemia, unspecified iron deficiency anemia type   4. B12 deficiency   5. Adjustment reaction with anxiety and depression   6. Weight loss     Beckey Rutter, DNP, AGNP-C [place] [facility] [phone] (clinic) 01/28/2021  CC: Dr. Janese Banks, [place]

## 2021-01-28 NOTE — Progress Notes (Signed)
[place] at [facility] [phone] [phone]   Name: Kathy Morton Date: 01/28/2021 MRN: [id number]  DOB: 03/12/1945  Patient Care Team: Leonides Sake, MD as PCP - General (Family Medicine) Clent Jacks, RN as Oncology Nurse Navigator    REASON FOR CONSULTATION: Kathy Morton is a 76 y.o. 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  Patient has had pain and anxiety.  She is referred to palliative care to help address goals and manage ongoing symptoms.  SOCIAL HISTORY:     reports that she has never smoked. She has never used smokeless tobacco. She reports that she does not drink alcohol and does not use drugs.  Patient is divorced.  She lives at home alone.  She has three sons who live nearby.  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[place])     PAST SURGICAL HISTORY:  Past Surgical History:  Procedure Laterality Date   BREAST BIOPSY Left 01/06/13   benign finding of sclerosing adenosis   COLONOSCOPY WITH PROPOFOL N/A 11/29/2015   Procedure: COLONOSCOPY WITH PROPOFOL;  Surgeon: Jonathon Bellows, MD;  Location: [facility] ENDOSCOPY;  Service: Endoscopy;  Laterality: N/A;   IR IMAGING GUIDED PORT INSERTION  01/17/2021   IR PARACENTESIS  01/17/2021   KNEE SURGERY Left 2010   torn meniscus   TONSILECTOMY, ADENOIDECTOMY, BILATERAL  MYRINGOTOMY AND TUBES      HEMATOLOGY/ONCOLOGY HISTORY:  Oncology History  Malignant neoplasm of ovary ([place])  01/13/2021 Initial Diagnosis   Malignant neoplasm of ovary ([place])   01/13/2021 Cancer Staging   Staging form: Ovary, Fallopian Tube, and Primary Peritoneal Carcinoma, AJCC 8th Edition - Clinical stage from 01/13/2021: Stage IVB (cTX, cNX, cM1b) - Signed by Sindy Guadeloupe, MD on 01/13/2021    01/18/2021 -  Chemotherapy   Patient is on Treatment Plan : OVARIAN Carboplatin (AUC 6) / Paclitaxel (175) q21d x 6 cycles       ALLERGIES:  has No Known Allergies.  MEDICATIONS:  Current Outpatient Medications  Medication Sig Dispense Refill   alendronate (FOSAMAX) 70 MG tablet Take 70 mg by mouth once a week. Take with a full glass of water on an empty stomach. (Patient not taking: Reported on 01/18/2021)     apixaban (ELIQUIS) 2.5 MG TABS tablet Take 1 tablet (2.5 mg total) by mouth 2 (two) times daily. 60 tablet 3   atorvastatin (LIPITOR) 20 MG tablet TAKE 1 TABLET BY MOUTH EVERY DAY 90 tablet 0   clonazePAM (KLONOPIN) 1 MG tablet Take 1 mg by mouth daily as needed for anxiety. (Patient not taking: Reported on 01/28/2021)     dexamethasone (DECADRON) 4 MG tablet Take 2 tablets (8 mg total) by mouth daily. Start the day after carboplatin chemotherapy for 3 days. [place]  tablet 1   folic acid (FOLVITE) 1 MG tablet Take 1 tablet (1 mg total) by mouth daily. 30 tablet 2   HYDROcodone-acetaminophen (NORCO) 5-325 MG tablet Take 1-2 tablets by mouth every 4 (four) hours as needed for moderate pain. 45 tablet 0   lidocaine-prilocaine (EMLA) cream Apply to affected area once 30 g 3   ondansetron (ZOFRAN) 8 MG tablet Take 1 tablet (8 mg total) by mouth 2 (two) times daily as needed for refractory nausea / vomiting. Start on day 3 after carboplatin chemo. 30 tablet 1   polyethylene glycol powder (GLYCOLAX/MIRALAX) 17 GM/SCOOP powder Daily as needed for constipation 255 g 3   predniSONE (STERAPRED UNI-PAK 21  TAB) 10 MG (21) TBPK tablet Take 6 tablets (60mg ) x 1 day, then take 5 tablets (50mg ) x 1 day, then take 4 tablets (40mg ) x 1 day, then take 3 tablets (30mg ) x 1 day, then take 2 tablets (20mg ) x 1 day, then take 1 tablet (10mg ) x 1 day, then stop 21 tablet 0   prochlorperazine (COMPAZINE) 10 MG tablet Take 1 tablet (10 mg total) by mouth every 6 (six) hours as needed (Nausea or vomiting). 30 tablet 1   QUEtiapine (SEROQUEL) 100 MG tablet Take 1 tablet (100 mg total) by mouth at bedtime. 90 tablet 1   SUMAtriptan (IMITREX) 100 MG tablet May repeat in 2 hours if headache persists or recurs. 9 tablet 5   No current facility-administered medications for this visit.   Facility-Administered Medications Ordered in Other Visits  Medication Dose Route Frequency Provider Last Rate Last Admin   cyanocobalamin ((VITAMIN B-12)) injection 1,000 mcg  1,000 mcg Intramuscular Q30 days Sindy Guadeloupe, MD   1,000 mcg at 01/28/21 1013   heparin lock flush 100 UNIT/ML injection             VITAL SIGNS: There were no vitals taken for this visit. There were no vitals filed for this visit.  Estimated body mass index is 27.92 kg/m as calculated from the following:   Height as of 01/18/21: 5\' 3"  (1.6 m).   Weight as of an earlier encounter on 01/28/21: 157 lb 9.6 oz (71.5 kg).  LABS: CBC:    Component Value Date/Time   WBC 44.5 (H) 01/28/2021 0857   HGB 10.6 (L) 01/28/2021 0857   HCT 29.8 (L) 01/28/2021 0857   PLT 294 01/28/2021 0857   MCV 89.5 01/28/2021 0857   NEUTROABS 34.8 (H) 01/28/2021 0857   LYMPHSABS 2.3 01/28/2021 0857   MONOABS 2.4 (H) 01/28/2021 0857   EOSABS 0.0 01/28/2021 0857   BASOSABS 0.1 01/28/2021 0857   Comprehensive Metabolic Panel:    Component Value Date/Time   NA 129 (L) 01/28/2021 0857   NA 135 03/10/2015 0923   K 4.0 01/28/2021 0857   CL 95 (L) 01/28/2021 0857   CO2 23 01/28/2021 0857   BUN 29 (H) 01/28/2021 0857   BUN 11 03/10/2015 0923   CREATININE 0.48 01/28/2021 0857    CREATININE 0.97 (H) 10/20/2016 0844   GLUCOSE 134 (H) 01/28/2021 0857   CALCIUM 9.2 01/28/2021 0857   AST 35 01/28/2021 0857   ALT 50 (H) 01/28/2021 0857   ALKPHOS 178 (H) 01/28/2021 0857   BILITOT 0.3 01/28/2021 0857   BILITOT <0.2 11/11/2014 1624   PROT 7.1 01/28/2021 0857   PROT 7.3 11/11/2014 1624   ALBUMIN 3.7 01/28/2021 0857   ALBUMIN 4.7 11/11/2014 1624    RADIOGRAPHIC STUDIES: DG Chest 2 View  Result Date: 01/01/2021 CLINICAL  DATA:  Shortness of breath. EXAM: CHEST - 2 VIEW COMPARISON:  PA Lat 12/30/2020. FINDINGS: The cardiac size is normal. There is mild aortic uncoiling, small amount of calcification. There are small pleural effusions collecting posteriorly, which was seen previously. The lungs are expiratory with very limited visualization of the lower lung fields. The visualized aerated lungs are clear. Osteopenia. IMPRESSION: Limited expiratory study. The lower lung fields are poorly evaluated. Visualized aerated lungs are clear. There are small pleural effusions. Overall aeration seems unchanged. Electronically Signed   By: Telford Nab M.D.   On: 01/01/2021 02:50   DG Chest 2 View  Result Date: 12/30/2020 CLINICAL DATA:  Shortness of breath with exertion for the past 2 weeks. EXAM: CHEST - 2 VIEW COMPARISON:  None. FINDINGS: The heart size and mediastinal contours are within normal limits. Normal pulmonary vascularity. Low lung volumes. Trace bilateral pleural effusions. No consolidation or pneumothorax. No acute osseous abnormality. IMPRESSION: 1. Low lung volumes with trace bilateral pleural effusions. Electronically Signed   By: Titus Dubin M.D.   On: 12/30/2020 12:42   CT Angio Chest PE W and/or Wo Contrast  Result Date: 01/01/2021 CLINICAL DATA:  Acute, nonlocalized abdominal pain. Shortness of breath for 1 week. EXAM: CT ANGIOGRAPHY CHEST CT ABDOMEN AND PELVIS WITH CONTRAST TECHNIQUE: Multidetector CT imaging of the chest was performed using the standard  protocol during bolus administration of intravenous contrast. Multiplanar CT image reconstructions and MIPs were obtained to evaluate the vascular anatomy. Multidetector CT imaging of the abdomen and pelvis was performed using the standard protocol during bolus administration of intravenous contrast. CONTRAST:  170mL OMNIPAQUE IOHEXOL 350 MG/ML SOLN COMPARISON:  Abdominal CT 05/26/2015 FINDINGS: CTA CHEST FINDINGS Cardiovascular: Satisfactory opacification of the pulmonary arteries to the segmental level on second injection, first being severely degraded by bolus dispersion. No evidence of pulmonary embolism. Normal heart size. No pericardial effusion. Coronary atherosclerosis. Mediastinum/Nodes: No mass or adenopathy. Lungs/Pleura: Small pleural effusions on the right more than left with atelectasis. Musculoskeletal: No acute or aggressive finding Review of the MIP images confirms the above findings. CT ABDOMEN and PELVIS FINDINGS Hepatobiliary: Compressed liver due to the size of ascites. Cholelithiasis without acute cholecystitis or ductal dilatation Pancreas: No acute finding.  No inflammation or mass seen Spleen: Negative Adrenals/Urinary Tract: Negative adrenals and kidneys. Largely collapsed bladder. Stomach/Bowel: No obstruction, inflammation, or visible mass Vascular/Lymphatic: Atheromatous calcification. Nodularity in the left groin which is likely tumor within the remnant inguinal canal. Reproductive: Asymmetric heterogeneous thickening of the left ovary measuring 3.3 cm. Other: Large volume ascites with tense abdomen. Omental caking especially below the transverse colon. Nodularity also seen in the pelvic peritoneal recesses. Anasarca. Musculoskeletal: No acute finding Review of the MIP images confirms the above findings. IMPRESSION: 1. 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 A primary is not certain, but there is notable asymmetric thickening and heterogeneity of the left  ovary (primary versus metastatic deposits). 2. Low volume chest with small pleural effusions and mild atelectasis. Negative for pulmonary embolism. Electronically Signed   By: Jorje Guild M.D.   On: 01/01/2021 10:04   CT ABDOMEN PELVIS W CONTRAST  Result Date: 01/01/2021 CLINICAL DATA:  Acute, nonlocalized abdominal pain. Shortness of breath for 1 week. EXAM: CT ANGIOGRAPHY CHEST CT ABDOMEN AND PELVIS WITH CONTRAST TECHNIQUE: Multidetector CT imaging of the chest was performed using the standard protocol during bolus administration of intravenous contrast. Multiplanar CT image reconstructions and MIPs were obtained to evaluate the vascular anatomy. Multidetector CT imaging  of the abdomen and pelvis was performed using the standard protocol during bolus administration of intravenous contrast. CONTRAST:  134mL OMNIPAQUE IOHEXOL 350 MG/ML SOLN COMPARISON:  Abdominal CT 05/26/2015 FINDINGS: CTA CHEST FINDINGS Cardiovascular: Satisfactory opacification of the pulmonary arteries to the segmental level on second injection, first being severely degraded by bolus dispersion. No evidence of pulmonary embolism. Normal heart size. No pericardial effusion. Coronary atherosclerosis. Mediastinum/Nodes: No mass or adenopathy. Lungs/Pleura: Small pleural effusions on the right more than left with atelectasis. Musculoskeletal: No acute or aggressive finding Review of the MIP images confirms the above findings. CT ABDOMEN and PELVIS FINDINGS Hepatobiliary: Compressed liver due to the size of ascites. Cholelithiasis without acute cholecystitis or ductal dilatation Pancreas: No acute finding.  No inflammation or mass seen Spleen: Negative Adrenals/Urinary Tract: Negative adrenals and kidneys. Largely collapsed bladder. Stomach/Bowel: No obstruction, inflammation, or visible mass Vascular/Lymphatic: Atheromatous calcification. Nodularity in the left groin which is likely tumor within the remnant inguinal canal. Reproductive:  Asymmetric heterogeneous thickening of the left ovary measuring 3.3 cm. Other: Large volume ascites with tense abdomen. Omental caking especially below the transverse colon. Nodularity also seen in the pelvic peritoneal recesses. Anasarca. Musculoskeletal: No acute finding Review of the MIP images confirms the above findings. IMPRESSION: 1. 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 A primary is not certain, but there is notable asymmetric thickening and heterogeneity of the left ovary (primary versus metastatic deposits). 2. Low volume chest with small pleural effusions and mild atelectasis. Negative for pulmonary embolism. Electronically Signed   By: Jorje Guild M.D.   On: 01/01/2021 10:04   US Venous Img Lower Bilateral (DVT)  Result Date: 01/02/2021 CLINICAL DATA:  Leg swelling.  Shortness of breath. EXAM: BILATERAL LOWER EXTREMITY VENOUS DOPPLER ULTRASOUND TECHNIQUE: Gray-scale sonography with graded compression, as well as color Doppler and duplex ultrasound were performed to evaluate the lower extremity deep venous systems from the level of the common femoral vein and including the common femoral, femoral, profunda femoral, popliteal and calf veins including the posterior tibial, peroneal and gastrocnemius veins when visible. The superficial great saphenous vein was also interrogated. Spectral Doppler was utilized to evaluate flow at rest and with distal augmentation maneuvers in the common femoral, femoral and popliteal veins. COMPARISON:  None. FINDINGS: RIGHT LOWER EXTREMITY Common Femoral Vein: No evidence of thrombus. Normal compressibility, respiratory phasicity and response to augmentation. Saphenofemoral Junction: No evidence of thrombus. Normal compressibility and flow on color Doppler imaging. Profunda Femoral Vein: No evidence of thrombus. Normal compressibility and flow on color Doppler imaging. Femoral Vein: No evidence of thrombus. Normal compressibility,  respiratory phasicity and response to augmentation. Popliteal Vein: No evidence of thrombus. Normal compressibility, respiratory phasicity and response to augmentation. Calf Veins: No evidence of thrombus. Normal compressibility and flow on color Doppler imaging. Other Findings: Fluid collection in the right popliteal fossa that measures 4.2 x 0.5 x 2.3 cm. Findings are suggestive for a Baker's cyst. LEFT LOWER EXTREMITY Common Femoral Vein: No evidence of thrombus. Normal compressibility, respiratory phasicity and response to augmentation. Saphenofemoral Junction: No evidence of thrombus. Normal compressibility and flow on color Doppler imaging. Profunda Femoral Vein: No evidence of thrombus. Normal compressibility and flow on color Doppler imaging. Femoral Vein: No evidence of thrombus. Normal compressibility, respiratory phasicity and response to augmentation. Popliteal Vein: No evidence of thrombus. Normal compressibility, respiratory phasicity and response to augmentation. Calf Veins: No evidence of thrombus. Normal compressibility and flow on color Doppler imaging. IMPRESSION: No evidence of deep venous thrombosis  in either lower extremity. Right knee Baker's cyst. Electronically Signed   By: Markus Daft M.D.   On: 01/02/2021 10:00   US Paracentesis  Result Date: 01/01/2021 INDICATION: Patient with suspected peritoneal carcinomatosis presents today with large volume ascites. Interventional radiology asked to perform a diagnostic and therapeutic paracentesis. EXAM: ULTRASOUND GUIDED PARACENTESIS MEDICATIONS: 1% lidocaine 10 mL COMPLICATIONS: None immediate. PROCEDURE: Informed written consent was obtained from the patient after a discussion of the risks, benefits and alternatives to treatment. A timeout was performed prior to the initiation of the procedure. Initial ultrasound scanning demonstrates a large amount of ascites within the right lower abdominal quadrant. The right lower abdomen was prepped and  draped in the usual sterile fashion. 1% lidocaine was used for local anesthesia. Following this, a 19 gauge, 10-cm, Yueh catheter was introduced. An ultrasound image was saved for documentation purposes. The paracentesis was performed. The catheter was removed and a dressing was applied. The patient tolerated the procedure well without immediate post procedural complication. Patient received post-procedure intravenous albumin; see nursing notes for details. FINDINGS: A total of approximately 12 L of amber-colored fluid was removed. Samples were sent to the laboratory as requested by the clinical team. IMPRESSION: Successful ultrasound-guided paracentesis yielding 12 liters of peritoneal fluid. Read by: Soyla Dryer, NP Electronically Signed   By: Albin Felling M.D.   On: 01/01/2021 15:31   DG Abd Portable 2 Views  Result Date: 01/01/2021 CLINICAL DATA:  Abdominal distension EXAM: PORTABLE ABDOMEN - 2 VIEW COMPARISON:  05/26/2015 FINDINGS: Gas and stool throughout the colon with relative sparing of the descending colon, but no convincing obstruction. No concerning mass effect or calcification. Lumbar spine degeneration and mild scoliosis. IMPRESSION: Moderate distension of the colon by gas and stool Electronically Signed   By: Jorje Guild M.D.   On: 01/01/2021 04:03   ECHOCARDIOGRAM COMPLETE  Result Date: 01/03/2021    ECHOCARDIOGRAM REPORT   Patient Name:   SHACORA ZYNDA Date of Exam: 01/02/2021 Medical Rec #:  [id number]         Height:       63.0 in Accession #:    [id number]        Weight:       177.2 lb Date of Birth:  11-Apr-1945          BSA:          1.837 m Patient Age:    21 years          BP:           125/53 mmHg Patient Gender: F                 HR:           88 bpm. Exam Location:  [facility] Procedure: 2D Echo Indications:     Dyspnea R06.00  History:         Patient has no prior history of Echocardiogram examinations.  Sonographer:     Kathlen Brunswick RDCS Referring Phys:  [id number]  CHRISTOPHER P DANFORD Diagnosing Phys: Kate Sable MD IMPRESSIONS  1. Left ventricular ejection fraction, by estimation, is 50 to 55%. The left ventricle has low normal function. The left ventricle has no regional wall motion abnormalities. There is mild left ventricular hypertrophy of the basal-septal segment. Left ventricular diastolic parameters are consistent with Grade I diastolic dysfunction (impaired relaxation).  2. Right ventricular systolic function is normal. The right ventricular size is normal.  3. The mitral valve is normal in  structure. No evidence of mitral valve regurgitation.  4. The aortic valve is tricuspid. Aortic valve regurgitation is not visualized. Aortic valve sclerosis/calcification is present, without any evidence of aortic stenosis. FINDINGS  Left Ventricle: Left ventricular ejection fraction, by estimation, is 50 to 55%. The left ventricle has low normal function. The left ventricle has no regional wall motion abnormalities. The left ventricular internal cavity size was normal in size. There is mild left ventricular hypertrophy of the basal-septal segment. Left ventricular diastolic parameters are consistent with Grade I diastolic dysfunction (impaired relaxation). Right Ventricle: The right ventricular size is normal. No increase in right ventricular wall thickness. Right ventricular systolic function is normal. Left Atrium: Left atrial size was normal in size. Right Atrium: Right atrial size was normal in size. Pericardium: There is no evidence of pericardial effusion. Mitral Valve: The mitral valve is normal in structure. No evidence of mitral valve regurgitation. Tricuspid Valve: The tricuspid valve is not well visualized. Tricuspid valve regurgitation is mild. Aortic Valve: The aortic valve is tricuspid. Aortic valve regurgitation is not visualized. Aortic valve sclerosis/calcification is present, without any evidence of aortic stenosis. Aortic valve peak gradient measures  11.8 mmHg. Pulmonic Valve: The pulmonic valve was not well visualized. Pulmonic valve regurgitation is not visualized. Aorta: The aortic root and ascending aorta are structurally normal, with no evidence of dilitation. IAS/Shunts: No atrial level shunt detected by color flow Doppler.  LEFT VENTRICLE PLAX 2D LVIDd:         4.30 cm      Diastology LVIDs:         2.90 cm      LV e' medial:    6.74 cm/s LV PW:         1.20 cm      LV E/e' medial:  8.3 LV IVS:        1.00 cm      LV e' lateral:   5.66 cm/s LVOT diam:     1.80 cm      LV E/e' lateral: 9.8 LV SV:         40 LV SV Index:   22 LVOT Area:     2.54 cm  LV Volumes (MOD) LV vol d, MOD A2C: 73.4 ml LV vol d, MOD A4C: 126.0 ml LV vol s, MOD A2C: 38.6 ml LV vol s, MOD A4C: 47.7 ml LV SV MOD A2C:     34.8 ml LV SV MOD A4C:     126.0 ml LV SV MOD BP:      55.2 ml RIGHT VENTRICLE RV Basal diam:  2.70 cm RV S prime:     12.00 cm/s TAPSE (M-mode): 1.8 cm LEFT ATRIUM             Index        RIGHT ATRIUM           Index LA diam:        4.00 cm 2.18 cm/m   RA Area:     10.00 cm LA Vol (A2C):   42.7 ml 23.25 ml/m  RA Volume:   19.50 ml  10.62 ml/m LA Vol (A4C):   21.8 ml 11.87 ml/m LA Biplane Vol: 32.6 ml 17.75 ml/m  AORTIC VALVE                 PULMONIC VALVE AV Area (Vmax): 1.41 cm     PV Vmax:       1.07 m/s AV Vmax:  172.00 cm/s  PV Peak grad:  4.6 mmHg AV Peak Grad:   11.8 mmHg LVOT Vmax:      95.30 cm/s LVOT Vmean:     61.200 cm/s LVOT VTI:       0.156 m  AORTA Ao Root diam: 2.40 cm Ao Asc diam:  2.60 cm MITRAL VALVE               TRICUSPID VALVE MV Area (PHT): 3.40 cm    TV Peak grad:   25.7 mmHg MV Decel Time: 223 msec    TV Vmax:        2.53 m/s MV E velocity: 55.70 cm/s MV A velocity: 93.40 cm/s  SHUNTS MV E/A ratio:  0.60        Systemic VTI:  0.16 m                            Systemic Diam: 1.80 cm Kate Sable MD Electronically signed by Kate Sable MD Signature Date/Time: 01/03/2021/10:15:32 AM    Final    IR IMAGING GUIDED PORT  INSERTION  Result Date: 01/17/2021 INDICATION: ovarian cancer needs port EXAM: 1. Ultrasound-guided puncture of the right internal jugular vein 2. Placement of a right-sided chest port using fluoroscopic guidance MEDICATIONS: None ANESTHESIA/SEDATION: Moderate (conscious) sedation was employed during this procedure. A total of Versed 2 mg and Fentanyl 100 mcg was administered intravenously. Moderate Sedation Time: 18 minutes. The patient's level of consciousness and vital signs were monitored continuously by radiology nursing throughout the procedure under my direct supervision. FLUOROSCOPY TIME:  Fluoroscopy Time: 0 minutes 18 seconds (1.2 mGy). COMPLICATIONS: None immediate. PROCEDURE: Informed written consent was obtained from the patient after a thorough discussion of the procedural risks, benefits and alternatives. All questions were addressed. Maximal Sterile Barrier Technique was utilized including caps, mask, sterile gowns, sterile gloves, sterile drape, hand hygiene and skin antiseptic. A timeout was performed prior to the initiation of the procedure. The patient was placed supine on the exam table. The right neck and chest was prepped and draped in the standard sterile fashion. A preliminary ultrasound of the right neck was performed and demonstrates a patent right internal jugular vein. A permanent ultrasound image was stored in the electronic medical record. The overlying skin was anesthetized with 1% Lidocaine. Using ultrasound guidance, access was obtained into the right internal jugular vein using a 21 gauge micropuncture set. A wire was advanced into the SVC, a short incision was made at the puncture site, and serial dilatation performed. Next, in an ipsilateral infraclavicular location, an incision was made at the site of the subcutaneous reservoir. Blunt dissection was used to open a pocket to contain the reservoir. A subcutaneous tunnel was then created from the port site to the puncture site.  A(n) 8 Fr single lumen catheter was advanced through the tunnel. The catheter was attached to the port and this was placed in the subcutaneous pocket. Under fluoroscopic guidance, a peel away sheath was placed, and the catheter was trimmed to the appropriate length and was advanced into the central veins. The catheter length is 20 cm. The tip of the catheter lies near the superior cavoatrial junction. The port flushes and aspirates appropriately. The port was flushed and locked with heparinized saline. The port pocket was closed in 2 layers using 3-0 and 4-0 Vicryl/absorbable suture. Dermabond was also applied to both incisions. The patient tolerated the procedure well and was transferred to recovery in stable  condition. IMPRESSION: Successful placement of a right chest port via the right internal jugular vein. The port is ready immediate use. Electronically Signed   By: Albin Felling M.D.   On: 01/17/2021 13:22   IR Paracentesis  Result Date: 01/17/2021 INDICATION: malignant ascites EXAM: ULTRASOUND GUIDED  PARACENTESIS MEDICATIONS: None. COMPLICATIONS: None immediate. PROCEDURE: Informed written consent was obtained from the patient after a discussion of the risks, benefits and alternatives to treatment. A timeout was performed prior to the initiation of the procedure. Initial ultrasound scanning demonstrates a large amount of ascites within the right lower abdominal quadrant. The right lower abdomen was prepped and draped in the usual sterile fashion. 1% lidocaine was used for local anesthesia. Following this, a 19 gauge 10 cm Yueh catheter was introduced. An ultrasound image was saved for documentation purposes. The paracentesis was performed. The catheter was removed and a dressing was applied. The patient tolerated the procedure well without immediate post procedural complication. FINDINGS: A total of approximately 9.8 L of clear, dark amber ascites fluid was removed. IMPRESSION: Successful  ultrasound-guided paracentesis yielding 9.8 L of peritoneal fluid. Electronically Signed   By: Albin Felling M.D.   On: 01/17/2021 13:18    PERFORMANCE STATUS (ECOG) : 1 - Symptomatic but completely ambulatory  Review of Systems Unless otherwise noted, a complete review of systems is negative.  Physical Exam General: NAD Pulmonary: Unlabored Extremities: no edema, no joint deformities Skin: no rashes Neurological: Weakness but otherwise nonfocal  IMPRESSION: Patient was an add-on to my schedule today at the request of Beckey Rutter, NP, to address anxiety.  Patient endorses chronic anxiety and has previously been managed on fluoxetine and clonazepam, prescribed by her PCP.  Patient stopped taking the fluoxetine recently.  She has also not been taking the clonazepam due to concerns that this could interact with pain medications.  Of note, patient was also prescribed lorazepam by our clinic for nausea/vomiting.    Patient would like to restart the clonazepam and so I will discontinue the lorazepam.  She is not interested in restarting fluoxetine.  She does have some neuropathic symptoms and is interested in trial of duloxetine.  Hopefully, this will also help her depression/anxiety.  We discussed referral for counseling services but patient declined this.  She says she has good social support from her family and feels like she has adequate tools necessary to cope with her current illness.  Patient reports improved pain.  She is weaning from prednisone but does not feel like steroids have worsened her anxiety.  She has Norco to take as needed for breakthrough pain.  Patient denies other symptomatic complaints at present.  PLAN: -Continue current scope of treatment -Continue clonazepam 1 mg daily as needed for anxiety -Discontinue lorazepam/fluoxetine -Start duloxetine 30 mg daily -Continue Norco as needed for pain -Daily bowel regimen -Living will on file -Patient will benefit from  completion of MOST form -Follow-up telephone visit in about a month   Patient expressed understanding and was in agreement with this plan. She also understands that She can call the clinic at any time with any questions, concerns, or complaints.     Time Total: 15 minutes  Visit consisted of counseling and education dealing with the complex and emotionally intense issues of symptom management and palliative care in the setting of serious and potentially life-threatening illness.Greater than 50%  of this time was spent counseling and coordinating care related to the above assessment and plan.  Signed by: Altha Harm, PhD, NP-C

## 2021-01-28 NOTE — Progress Notes (Signed)
Pt c/o low energy and occasional nausea. Pt c/o tingling in fingers and toes.

## 2021-01-28 NOTE — Patient Instructions (Signed)
[facility] AT [place]  Discharge Instructions: Thank you for choosing [facility] to provide your oncology and hematology care.  If you have a lab appointment with the [facility], please go directly to the [facility] and check in at the registration area.  Wear comfortable clothing and clothing appropriate for easy access to any Portacath or PICC line.   We strive to give you quality time with your provider. You may need to reschedule your appointment if you arrive late (15 or more minutes).  Arriving late affects you and other patients whose appointments are after yours.  Also, if you miss three or more appointments without notifying the office, you may be dismissed from the clinic at the providers discretion.      For prescription refill requests, have your pharmacy contact our office and allow 72 hours for refills to be completed.    Today you received the following chemotherapy and/or immunotherapy agents VENOFER, B12, HYDRATION      To help prevent nausea and vomiting after your treatment, we encourage you to take your nausea medication as directed.  BELOW ARE SYMPTOMS THAT SHOULD BE REPORTED IMMEDIATELY: *FEVER GREATER THAN 100.4 F (38 C) OR HIGHER *CHILLS OR SWEATING *NAUSEA AND VOMITING THAT IS NOT CONTROLLED WITH YOUR NAUSEA MEDICATION *UNUSUAL SHORTNESS OF BREATH *UNUSUAL BRUISING OR BLEEDING *URINARY PROBLEMS (pain or burning when urinating, or frequent urination) *BOWEL PROBLEMS (unusual diarrhea, constipation, pain near the anus) TENDERNESS IN MOUTH AND THROAT WITH OR WITHOUT PRESENCE OF ULCERS (sore throat, sores in mouth, or a toothache) UNUSUAL RASH, SWELLING OR PAIN  UNUSUAL VAGINAL DISCHARGE OR ITCHING   Items with * indicate a potential emergency and should be followed up as soon as possible or go to the Emergency Department if any problems should occur.  Please show the CHEMOTHERAPY ALERT CARD or IMMUNOTHERAPY ALERT CARD at  check-in to the Emergency Department and triage nurse.  Should you have questions after your visit or need to cancel or reschedule your appointment, please contact [facility] [place] AT [place]  [phone] and follow the prompts.  Office hours are 8:00 a.m. to 4:30 p.m. Monday - Friday. Please note that voicemails left after 4:00 p.m. may not be returned until the following business day.  We are closed weekends and major holidays. You have access to a nurse at all times for urgent questions. Please call the main number to the clinic [phone] and follow the prompts.  For any non-urgent questions, you may also contact your provider using MyChart. We now offer e-Visits for anyone 33 and older to request care online for non-urgent symptoms. For details visit [email or web address].   Also download the MyChart app! Go to the app store, search "MyChart", open the app, select [facility], and log in with your MyChart username and password.  Due to Covid, a mask is required upon entering the hospital/clinic. If you do not have a mask, one will be given to you upon arrival. For doctor visits, patients may have 1 support person aged 39 or older with them. For treatment visits, patients cannot have anyone with them due to current Covid guidelines and our immunocompromised population.   Iron Sucrose Injection What is this medication? IRON SUCROSE (EYE ern SOO krose) treats low levels of iron (iron deficiency anemia) in people with kidney disease. Iron is a mineral that plays an important role in making red blood cells, which carry oxygen from your lungs to the rest of your body. This  medicine may be used for other purposes; ask your health care provider or pharmacist if you have questions. COMMON BRAND NAME(S): Venofer What should I tell my care team before I take this medication? They need to know if you have any of these conditions: Anemia not caused by low iron levels Heart  disease High levels of iron in the blood Kidney disease Liver disease An unusual or allergic reaction to iron, other medications, foods, dyes, or preservatives Pregnant or trying to get pregnant Breast-feeding How should I use this medication? This medication is for infusion into a vein. It is given in a hospital or clinic setting. Talk to your care team about the use of this medication in children. While this medication may be prescribed for children as young as 2 years for selected conditions, precautions do apply. Overdosage: If you think you have taken too much of this medicine contact a poison control center or emergency room at once. NOTE: This medicine is only for you. Do not share this medicine with others. What if I miss a dose? It is important not to miss your dose. Call your care team if you are unable to keep an appointment. What may interact with this medication? Do not take this medication with any of the following: Deferoxamine Dimercaprol Other iron products This medication may also interact with the following: Chloramphenicol Deferasirox This list may not describe all possible interactions. Give your health care provider a list of all the medicines, herbs, non-prescription drugs, or dietary supplements you use. Also tell them if you smoke, drink alcohol, or use illegal drugs. Some items may interact with your medicine. What should I watch for while using this medication? Visit your care team regularly. Tell your care team if your symptoms do not start to get better or if they get worse. You may need blood work done while you are taking this medication. You may need to follow a special diet. Talk to your care team. Foods that contain iron include: whole grains/cereals, dried fruits, beans, or peas, leafy green vegetables, and organ meats (liver, kidney). What side effects may I notice from receiving this medication? Side effects that you should report to your care team as  soon as possible: Allergic reactions--skin rash, itching, hives, swelling of the face, lips, tongue, or throat Low blood pressure--dizziness, feeling faint or lightheaded, blurry vision Shortness of breath Side effects that usually do not require medical attention (report to your care team if they continue or are bothersome): Flushing Headache Joint pain Muscle pain Nausea Pain, redness, or irritation at injection site This list may not describe all possible side effects. Call your doctor for medical advice about side effects. You may report side effects to FDA at 1-800-FDA-1088. Where should I keep my medication? This medication is given in a hospital or clinic and will not be stored at home. NOTE: This sheet is a summary. It may not cover all possible information. If you have questions about this medicine, talk to your doctor, pharmacist, or health care provider.  2022 Elsevier/Gold Standard (2020-05-14 00:00:00)  Vitamin B12 Injection What is this medication? Vitamin B12 (VAHY tuh min B12) prevents and treats low vitamin B12 levels in your body. It is used in people who do not get enough vitamin B12 from their diet or when their digestive tract does not absorb enough. Vitamin B12 plays an important role in maintaining the health of your nervous system and red blood cells. This medicine may be used for other purposes; ask  your health care provider or pharmacist if you have questions. COMMON BRAND NAME(S): B-12 Compliance Kit, B-12 Injection Kit, Cyomin, Dodex, LA-12, Nutri-Twelve, Physicians EZ Use B-12, Primabalt What should I tell my care team before I take this medication? They need to know if you have any of these conditions: Kidney disease Leber's disease Megaloblastic anemia An unusual or allergic reaction to cyanocobalamin, cobalt, other medications, foods, dyes, or preservatives Pregnant or trying to get pregnant Breast-feeding How should I use this medication? This  medication is injected into a muscle or deeply under the skin. It is usually given in a clinic or care team's office. However, your care team may teach you how to inject yourself. Follow all instructions. Talk to your care team about the use of this medication in children. Special care may be needed. Overdosage: If you think you have taken too much of this medicine contact a poison control center or emergency room at once. NOTE: This medicine is only for you. Do not share this medicine with others. What if I miss a dose? If you are given your dose at a clinic or care team's office, call to reschedule your appointment. If you give your own injections, and you miss a dose, take it as soon as you can. If it is almost time for your next dose, take only that dose. Do not take double or extra doses. What may interact with this medication? Colchicine Heavy alcohol intake This list may not describe all possible interactions. Give your health care provider a list of all the medicines, herbs, non-prescription drugs, or dietary supplements you use. Also tell them if you smoke, drink alcohol, or use illegal drugs. Some items may interact with your medicine. What should I watch for while using this medication? Visit your care team regularly. You may need blood work done while you are taking this medication. You may need to follow a special diet. Talk to your care team. Limit your alcohol intake and avoid smoking to get the best benefit. What side effects may I notice from receiving this medication? Side effects that you should report to your care team as soon as possible: Allergic reactions--skin rash, itching, hives, swelling of the face, lips, tongue, or throat Swelling of the ankles, hands, or feet Trouble breathing Side effects that usually do not require medical attention (report to your care team if they continue or are bothersome): Diarrhea This list may not describe all possible side effects. Call  your doctor for medical advice about side effects. You may report side effects to FDA at 1-800-FDA-1088. Where should I keep my medication? Keep out of the reach of children. Store at room temperature between 15 and 30 degrees C (59 and 85 degrees F). Protect from light. Throw away any unused medication after the expiration date. NOTE: This sheet is a summary. It may not cover all possible information. If you have questions about this medicine, talk to your doctor, pharmacist, or health care provider.  2022 Elsevier/Gold Standard (2020-03-03 00:00:00)  Dehydration, Adult Dehydration is condition in which there is not enough water or other fluids in the body. This happens when a person loses more fluids than he or she takes in. Important body parts cannot work right without the right amount of fluids. Any loss of fluids from the body can cause dehydration. Dehydration can be mild, worse, or very bad. It should be treated right away to keep it from getting very bad. What are the causes? This condition may be caused  by: Conditions that cause loss of water or other fluids, such as: Watery poop (diarrhea). Vomiting. Sweating a lot. Peeing (urinating) a lot. Not drinking enough fluids, especially when you: Are ill. Are doing things that take a lot of energy to do. Other illnesses and conditions, such as fever or infection. Certain medicines, such as medicines that take extra fluid out of the body (diuretics). Lack of safe drinking water. Not being able to get enough water and food. What increases the risk? The following factors may make you more likely to develop this condition: Having a long-term (chronic) illness that has not been treated the right way, such as: Diabetes. Heart disease. Kidney disease. Being 83 years of age or older. Having a disability. Living in a place that is high above the ground or sea (high in altitude). The thinner, dried air causes more fluid loss. Doing  exercises that put stress on your body for a long time. What are the signs or symptoms? Symptoms of dehydration depend on how bad it is. Mild or worse dehydration Thirst. Dry lips or dry mouth. Feeling dizzy or light-headed, especially when you stand up from sitting. Muscle cramps. Your body making: Dark pee (urine). Pee may be the color of tea. Less pee than normal. Less tears than normal. Headache. Very bad dehydration Changes in skin. Skin may: Be cold to the touch (clammy). Be blotchy or pale. Not go back to normal right after you lightly pinch it and let it go. Little or no tears, pee, or sweat. Changes in vital signs, such as: Fast breathing. Low blood pressure. Weak pulse. Pulse that is more than 100 beats a minute when you are sitting still. Other changes, such as: Feeling very thirsty. Eyes that look hollow (sunken). Cold hands and feet. Being mixed up (confused). Being very tired (lethargic) or having trouble waking from sleep. Short-term weight loss. Loss of consciousness. How is this treated? Treatment for this condition depends on how bad it is. Treatment should start right away. Do not wait until your condition gets very bad. Very bad dehydration is an emergency. You will need to go to a hospital. Mild or worse dehydration can be treated at home. You may be asked to: Drink more fluids. Drink an oral rehydration solution (ORS). This drink helps get the right amounts of fluids and salts and minerals in the blood (electrolytes). Very bad dehydration can be treated: With fluids through an IV tube. By getting normal levels of salts and minerals in your blood. This is often done by giving salts and minerals through a tube. The tube is passed through your nose and into your stomach. By treating the root cause. Follow these instructions at home: Oral rehydration solution If told by your doctor, drink an ORS: Make an ORS. Use instructions on the package. Start by  drinking small amounts, about  cup (120 mL) every 5-10 minutes. Slowly drink more until you have had the amount that your doctor said to have. Eating and drinking     Drink enough clear fluid to keep your pee pale yellow. If you were told to drink an ORS, finish the ORS first. Then, start slowly drinking other clear fluids. Drink fluids such as: Water. Do not drink only water. Doing that can make the salt (sodium) level in your body get too low. Water from ice chips you suck on. Fruit juice that you have added water to (diluted). Low-calorie sports drinks. Eat foods that have the right amounts of salts and  minerals, such as: Bananas. Oranges. Potatoes. Tomatoes. Spinach. Do not drink alcohol. Avoid: Drinks that have a lot of sugar. These include: High-calorie sports drinks. Fruit juice that you did not add water to. Soda. Caffeine. Foods that are greasy or have a lot of fat or sugar. General instructions Take over-the-counter and prescription medicines only as told by your doctor. Do not take salt tablets. Doing that can make the salt level in your body get too high. Return to your normal activities as told by your doctor. Ask your doctor what activities are safe for you. Keep all follow-up visits as told by your doctor. This is important. Contact a doctor if: You have pain in your belly (abdomen) and the pain: Gets worse. Stays in one place. You have a rash. You have a stiff neck. You get angry or annoyed (irritable) more easily than normal. You are more tired or have a harder time waking than normal. You feel: Weak or dizzy. Very thirsty. Get help right away if you have: Any symptoms of very bad dehydration. Symptoms of vomiting, such as: You cannot eat or drink without vomiting. Your vomiting gets worse or does not go away. Your vomit has blood or green stuff in it. Symptoms that get worse with treatment. A fever. A very bad headache. Problems with peeing or  pooping (having a bowel movement), such as: Watery poop that gets worse or does not go away. Blood in your poop (stool). This may cause poop to look black and tarry. Not peeing in 6-8 hours. Peeing only a small amount of very dark pee in 6-8 hours. Trouble breathing. These symptoms may be an emergency. Do not wait to see if the symptoms will go away. Get medical help right away. Call your local emergency services (911 in the [place] Do not drive yourself to the hospital. Summary Dehydration is a condition in which there is not enough water or other fluids in the body. This happens when a person loses more fluids than he or she takes in. Treatment for this condition depends on how bad it is. Treatment should be started right away. Do not wait until your condition gets very bad. Drink enough clear fluid to keep your pee pale yellow. If you were told to drink an oral rehydration solution (ORS), finish the ORS first. Then, start slowly drinking other clear fluids. Take over-the-counter and prescription medicines only as told by your doctor. Get help right away if you have any symptoms of very bad dehydration. This information is not intended to replace advice given to you by your health care provider. Make sure you discuss any questions you have with your health care provider. Document Revised: 08/01/2018 Document Reviewed: 08/01/2018 Elsevier Patient Education  [place].

## 2021-02-01 ENCOUNTER — Other Ambulatory Visit: Payer: Self-pay

## 2021-02-01 ENCOUNTER — Other Ambulatory Visit: Payer: Self-pay | Admitting: Oncology

## 2021-02-01 DIAGNOSIS — C561 Malignant neoplasm of right ovary: Secondary | ICD-10-CM | POA: Diagnosis not present

## 2021-02-01 DIAGNOSIS — C562 Malignant neoplasm of left ovary: Secondary | ICD-10-CM | POA: Diagnosis not present

## 2021-02-01 DIAGNOSIS — C5700 Malignant neoplasm of unspecified fallopian tube: Secondary | ICD-10-CM | POA: Diagnosis not present

## 2021-02-01 DIAGNOSIS — C481 Malignant neoplasm of specified parts of peritoneum: Secondary | ICD-10-CM | POA: Diagnosis not present

## 2021-02-02 ENCOUNTER — Telehealth: Payer: Self-pay | Admitting: *Deleted

## 2021-02-02 NOTE — Telephone Encounter (Signed)
Ok to take tylenol and OTC antihistamines like cetrizine

## 2021-02-02 NOTE — Telephone Encounter (Signed)
Patient called reporting that she has sinus congestion. Denies fever, cough, sore throat. She does admit she will sneeze occasionally. She is asking what over the counter medicine she can take for this, the only thing she has taken is Tylenol. Please advise

## 2021-02-02 NOTE — Telephone Encounter (Signed)
CAll returned to patient and advised of Dr Elroy Channel response she asked that I tell her names of antihistamines so she could see if she had any on hand. She has Claritin and Benadryl at home an dis taking Claritin. She will call back if she develops fever or does not improve

## 2021-02-03 LAB — CA 125: Cancer Antigen (CA) 125: 691 U/mL — ABNORMAL HIGH (ref 0.0–38.1)

## 2021-02-07 ENCOUNTER — Telehealth: Payer: Self-pay | Admitting: Licensed Clinical Social Worker

## 2021-02-07 NOTE — Telephone Encounter (Signed)
Called to let Ms. Moree know that unfortunately the germline sample through [facility] failed and that we will need to re-draw. We will have another kit sent off tomorrow when she comes in.

## 2021-02-08 ENCOUNTER — Other Ambulatory Visit: Payer: Self-pay

## 2021-02-08 ENCOUNTER — Encounter: Payer: Self-pay | Admitting: Oncology

## 2021-02-08 ENCOUNTER — Inpatient Hospital Stay (HOSPITAL_BASED_OUTPATIENT_CLINIC_OR_DEPARTMENT_OTHER): Payer: Medicare PPO | Admitting: Oncology

## 2021-02-08 ENCOUNTER — Inpatient Hospital Stay: Payer: Medicare PPO

## 2021-02-08 ENCOUNTER — Inpatient Hospital Stay: Payer: Medicare PPO | Attending: Hospice and Palliative Medicine

## 2021-02-08 VITALS — BP 138/75 | HR 93

## 2021-02-08 VITALS — BP 141/76 | HR 98 | Temp 98.3°F | Resp 16 | Ht 63.0 in | Wt 152.4 lb

## 2021-02-08 DIAGNOSIS — Z5111 Encounter for antineoplastic chemotherapy: Secondary | ICD-10-CM

## 2021-02-08 DIAGNOSIS — Z7901 Long term (current) use of anticoagulants: Secondary | ICD-10-CM | POA: Diagnosis not present

## 2021-02-08 DIAGNOSIS — E538 Deficiency of other specified B group vitamins: Secondary | ICD-10-CM | POA: Insufficient documentation

## 2021-02-08 DIAGNOSIS — G620 Drug-induced polyneuropathy: Secondary | ICD-10-CM | POA: Insufficient documentation

## 2021-02-08 DIAGNOSIS — C786 Secondary malignant neoplasm of retroperitoneum and peritoneum: Secondary | ICD-10-CM | POA: Insufficient documentation

## 2021-02-08 DIAGNOSIS — Z79899 Other long term (current) drug therapy: Secondary | ICD-10-CM | POA: Insufficient documentation

## 2021-02-08 DIAGNOSIS — Z5112 Encounter for antineoplastic immunotherapy: Secondary | ICD-10-CM | POA: Diagnosis not present

## 2021-02-08 DIAGNOSIS — F419 Anxiety disorder, unspecified: Secondary | ICD-10-CM | POA: Insufficient documentation

## 2021-02-08 DIAGNOSIS — D508 Other iron deficiency anemias: Secondary | ICD-10-CM

## 2021-02-08 DIAGNOSIS — R971 Elevated cancer antigen 125 [CA 125]: Secondary | ICD-10-CM | POA: Insufficient documentation

## 2021-02-08 DIAGNOSIS — K1230 Oral mucositis (ulcerative), unspecified: Secondary | ICD-10-CM | POA: Insufficient documentation

## 2021-02-08 DIAGNOSIS — C569 Malignant neoplasm of unspecified ovary: Secondary | ICD-10-CM | POA: Diagnosis not present

## 2021-02-08 DIAGNOSIS — Z8052 Family history of malignant neoplasm of bladder: Secondary | ICD-10-CM | POA: Diagnosis not present

## 2021-02-08 DIAGNOSIS — R530 Neoplastic (malignant) related fatigue: Secondary | ICD-10-CM | POA: Insufficient documentation

## 2021-02-08 DIAGNOSIS — Z803 Family history of malignant neoplasm of breast: Secondary | ICD-10-CM | POA: Diagnosis not present

## 2021-02-08 LAB — CBC WITH DIFFERENTIAL/PLATELET
Abs Immature Granulocytes: 0.07 10*3/uL (ref 0.00–0.07)
Basophils Absolute: 0.1 10*3/uL (ref 0.0–0.1)
Basophils Relative: 1 %
Eosinophils Absolute: 0.1 10*3/uL (ref 0.0–0.5)
Eosinophils Relative: 1 %
HCT: 34.1 % — ABNORMAL LOW (ref 36.0–46.0)
Hemoglobin: 10.8 g/dL — ABNORMAL LOW (ref 12.0–15.0)
Immature Granulocytes: 1 %
Lymphocytes Relative: 18 %
Lymphs Abs: 1.6 10*3/uL (ref 0.7–4.0)
MCH: 29 pg (ref 26.0–34.0)
MCHC: 31.7 g/dL (ref 30.0–36.0)
MCV: 91.4 fL (ref 80.0–100.0)
Monocytes Absolute: 1 10*3/uL (ref 0.1–1.0)
Monocytes Relative: 11 %
Neutro Abs: 6.1 10*3/uL (ref 1.7–7.7)
Neutrophils Relative %: 68 %
Platelets: 484 10*3/uL — ABNORMAL HIGH (ref 150–400)
RBC: 3.73 MIL/uL — ABNORMAL LOW (ref 3.87–5.11)
RDW: 22.4 % — ABNORMAL HIGH (ref 11.5–15.5)
WBC: 9 10*3/uL (ref 4.0–10.5)
nRBC: 0 % (ref 0.0–0.2)

## 2021-02-08 LAB — COMPREHENSIVE METABOLIC PANEL
ALT: 29 U/L (ref 0–44)
AST: 26 U/L (ref 15–41)
Albumin: 3.7 g/dL (ref 3.5–5.0)
Alkaline Phosphatase: 106 U/L (ref 38–126)
Anion gap: 5 (ref 5–15)
BUN: 18 mg/dL (ref 8–23)
CO2: 21 mmol/L — ABNORMAL LOW (ref 22–32)
Calcium: 8.4 mg/dL — ABNORMAL LOW (ref 8.9–10.3)
Chloride: 104 mmol/L (ref 98–111)
Creatinine, Ser: 0.41 mg/dL — ABNORMAL LOW (ref 0.44–1.00)
GFR, Estimated: >60 mL/min (ref >60)
Glucose, Bld: 118 mg/dL — ABNORMAL HIGH (ref 70–99)
Potassium: 3.9 mmol/L (ref 3.5–5.1)
Sodium: 130 mmol/L — ABNORMAL LOW (ref 135–145)
Total Bilirubin: 0.1 mg/dL — ABNORMAL LOW (ref 0.3–1.2)
Total Protein: 7 g/dL (ref 6.5–8.1)

## 2021-02-08 LAB — PROTEIN, URINE, RANDOM: Total Protein, Urine: <6 mg/dL

## 2021-02-08 MED ORDER — CYANOCOBALAMIN 1000 MCG/ML IJ SOLN
1000.0000 ug | INTRAMUSCULAR | Status: DC
  Administered 2021-02-08: 1000 ug via INTRAMUSCULAR
  Filled 2021-02-08: qty 1

## 2021-02-08 MED ORDER — DIPHENHYDRAMINE HCL 50 MG/ML IJ SOLN
50.0000 mg | Freq: Once | INTRAMUSCULAR | Status: AC
  Administered 2021-02-08: 50 mg via INTRAVENOUS
  Filled 2021-02-08: qty 1

## 2021-02-08 MED ORDER — PALONOSETRON HCL INJECTION 0.25 MG/5ML
0.2500 mg | Freq: Once | INTRAVENOUS | Status: AC
  Administered 2021-02-08: 0.25 mg via INTRAVENOUS
  Filled 2021-02-08: qty 5

## 2021-02-08 MED ORDER — SODIUM CHLORIDE 0.9 % IV SOLN
150.0000 mg | Freq: Once | INTRAVENOUS | Status: AC
  Administered 2021-02-08: 150 mg via INTRAVENOUS
  Filled 2021-02-08: qty 150

## 2021-02-08 MED ORDER — IRON SUCROSE 20 MG/ML IV SOLN
200.0000 mg | Freq: Once | INTRAVENOUS | Status: AC
  Administered 2021-02-08: 200 mg via INTRAVENOUS
  Filled 2021-02-08: qty 10

## 2021-02-08 MED ORDER — SODIUM CHLORIDE 0.9 % IV SOLN
398.0000 mg | Freq: Once | INTRAVENOUS | Status: AC
  Administered 2021-02-08: 400 mg via INTRAVENOUS
  Filled 2021-02-08: qty 40

## 2021-02-08 MED ORDER — SODIUM CHLORIDE 0.9 % IV SOLN: 200.0000 mg | INTRAVENOUS | Status: DC

## 2021-02-08 MED ORDER — SODIUM CHLORIDE 0.9 % IV SOLN
175.0000 mg/m2 | Freq: Once | INTRAVENOUS | Status: AC
  Administered 2021-02-08: 312 mg via INTRAVENOUS
  Filled 2021-02-08: qty 52

## 2021-02-08 MED ORDER — HEPARIN SOD (PORK) LOCK FLUSH 100 UNIT/ML IV SOLN
500.0000 [IU] | Freq: Once | INTRAVENOUS | Status: AC | PRN
  Administered 2021-02-08: 500 [IU]
  Filled 2021-02-08: qty 5

## 2021-02-08 MED ORDER — SODIUM CHLORIDE 0.9 % IV SOLN
10.0000 mg | Freq: Once | INTRAVENOUS | Status: AC
  Administered 2021-02-08: 10 mg via INTRAVENOUS
  Filled 2021-02-08: qty 10

## 2021-02-08 MED ORDER — SODIUM CHLORIDE 0.9 % IV SOLN
1000.0000 mg | Freq: Once | INTRAVENOUS | Status: AC
  Administered 2021-02-08: 1000 mg via INTRAVENOUS
  Filled 2021-02-08: qty 32

## 2021-02-08 MED ORDER — SODIUM CHLORIDE 0.9 % IV SOLN
Freq: Once | INTRAVENOUS | Status: AC
  Filled 2021-02-08: qty 250

## 2021-02-08 MED ORDER — FAMOTIDINE IN NACL 20-0.9 MG/50ML-% IV SOLN
20.0000 mg | Freq: Once | INTRAVENOUS | Status: AC
  Administered 2021-02-08: 20 mg via INTRAVENOUS
  Filled 2021-02-08: qty 50

## 2021-02-08 NOTE — Progress Notes (Signed)
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  She had CT angio chest which did not show any evidence of PE.  Low volume chest with small pleural effusions.  CT abdomen and pelvis with contrast showed tense ascites with features of underlying peritoneal carcinomatosis.  Asymmetric thickening and heterogeneity of the left ovary raising the concern for potential ovarian cancer.  Nodularity in the left groin which is likely tumor within the remnant inguinal canal.   Cytology consistent with high-grade serous carcinoma.The carcinoma is positive for PAX-8 and WT-1. Rare cells demonstrate  weak ER positivity. The cytomorphologic findings in conjunction with the  pattern of immunoreactivity are consistent with the above diagnosis. CA125 elevated at 2336.  Interval history-reports ongoing fatigue.  She did have significant bone pains after receiving Neulasta and would like to forego at this time.  Abdomen has been feeling more soft after fluid was drained twice.  Denies any significant nausea or vomiting.  Reports a small mouth sore in the floor of her mouth  ECOG PS- 2 Pain scale- 0   Review of systems- Review of Systems  Constitutional:  Negative for chills, fever, malaise/fatigue and weight loss.   HENT:  Negative for congestion, ear discharge and nosebleeds.   Eyes:  Negative for blurred vision.  Respiratory:  Negative for cough, hemoptysis, sputum production, shortness of breath and wheezing.   Cardiovascular:  Negative for chest pain, palpitations, orthopnea and claudication.  Gastrointestinal:  Negative for abdominal pain, blood in stool, constipation, diarrhea, heartburn, melena, nausea and vomiting.  Genitourinary:  Negative for dysuria, flank pain, frequency, hematuria and urgency.  Musculoskeletal:  Negative for back pain, joint pain and myalgias.  Skin:  Negative for rash.  Neurological:  Negative for dizziness, tingling, focal weakness, seizures, weakness and headaches.  Endo/Heme/Allergies:  Does not bruise/bleed easily.  Psychiatric/Behavioral:  Negative for depression and suicidal ideas. The patient does not have insomnia.      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 [facility])      Past Surgical History:  Procedure Laterality Date   BREAST BIOPSY Left 01/06/13   benign finding of sclerosing adenosis   COLONOSCOPY WITH PROPOFOL N/A 11/29/2015   Procedure: COLONOSCOPY WITH PROPOFOL;  Surgeon: Jonathon Bellows, MD;  Location: [facility] ENDOSCOPY;  Service: Endoscopy;  Laterality: N/A;   IR IMAGING GUIDED PORT INSERTION  01/17/2021   IR PARACENTESIS  01/17/2021   KNEE SURGERY Left 2010   torn meniscus  TONSILECTOMY, ADENOIDECTOMY, BILATERAL MYRINGOTOMY AND TUBES      Social History   Socioeconomic History   Marital status: Divorced    Spouse name: Not on file   Number of children: Not on file   Years of education: Not on file   Highest education level: Some college, no degree   Occupational History   Occupation: retired   Tobacco Use   Smoking status: Never   Smokeless tobacco: Never  Vaping Use   Vaping Use: Never used  Substance and Sexual Activity   Alcohol use: No   Drug use: No   Sexual activity: Not Currently    Birth control/protection: None  Other Topics Concern   Not on file  Social History Narrative   Worked at the [employer].   Social Determinants of Health   Financial Resource Strain: Not on file  Food Insecurity: Not on file  Transportation Needs: Not on file  Physical Activity: Not on file  Stress: Not on file  Social Connections: Not on file  Intimate Partner Violence: Not on file    Family History  Problem Relation Age of Onset   Bladder Cancer Mother    Heart attack Father    Breast cancer Maternal Grandmother 80   Kidney disease Neg Hx      Current Outpatient Medications:    alendronate (FOSAMAX) 70 MG tablet, Take 70 mg by mouth once a week. Take with a full glass of water on an empty stomach., Disp: , Rfl:    apixaban (ELIQUIS) 2.5 MG TABS tablet, Take 1 tablet (2.5 mg total) by mouth 2 (two) times daily., Disp: 60 tablet, Rfl: 3   atorvastatin (LIPITOR) 20 MG tablet, TAKE 1 TABLET BY MOUTH EVERY DAY, Disp: 90 tablet, Rfl: 0   clonazePAM (KLONOPIN) 1 MG tablet, Take 1 mg by mouth daily as needed for anxiety., Disp: , Rfl:    dexamethasone (DECADRON) 4 MG tablet, Take 2 tablets (8 mg total) by mouth daily. Start the day after carboplatin chemotherapy for 3 days., Disp: 30 tablet, Rfl: 1   DULoxetine (CYMBALTA) 30 MG capsule, Take 1 capsule (30 mg total) by mouth daily., Disp: 30 capsule, Rfl: 3   folic acid (FOLVITE) 1 MG tablet, Take 1 tablet (1 mg total) by mouth daily., Disp: 30 tablet, Rfl: 2   lidocaine-prilocaine (EMLA) cream, Apply to affected area once, Disp: 30 g, Rfl: 3   loratadine (CLARITIN) 10 MG tablet, Take 10 mg by mouth daily., Disp: , Rfl:    ondansetron (ZOFRAN) 8 MG tablet, Take 1 tablet (8 mg  total) by mouth 2 (two) times daily as needed for refractory nausea / vomiting. Start on day 3 after carboplatin chemo., Disp: 30 tablet, Rfl: 1   prochlorperazine (COMPAZINE) 10 MG tablet, Take 1 tablet (10 mg total) by mouth every 6 (six) hours as needed (Nausea or vomiting)., Disp: 30 tablet, Rfl: 1   QUEtiapine (SEROQUEL) 100 MG tablet, Take 1 tablet (100 mg total) by mouth at bedtime., Disp: 90 tablet, Rfl: 1   SUMAtriptan (IMITREX) 100 MG tablet, May repeat in 2 hours if headache persists or recurs., Disp: 9 tablet, Rfl: 5   HYDROcodone-acetaminophen (NORCO) 5-325 MG tablet, Take 1-2 tablets by mouth every 4 (four) hours as needed for moderate pain. (Patient not taking: Reported on 02/08/2021), Disp: 45 tablet, Rfl: 0 No current facility-administered medications for this visit.  Facility-Administered Medications Ordered in Other Visits:    bevacizumab-awwb (MVASI) 1,000 mg in sodium chloride 0.9 % 100  mL chemo infusion, 1,000 mg, Intravenous, Once, Sindy Guadeloupe, MD   CARBOplatin (PARAPLATIN) 400 mg in sodium chloride 0.9 % 250 mL chemo infusion, 400 mg, Intravenous, Once, Sindy Guadeloupe, MD   cyanocobalamin ((VITAMIN B-12)) injection 1,000 mcg, 1,000 mcg, Intramuscular, Q30 days, Sindy Guadeloupe, MD, 1,000 mcg at 02/08/21 7517   fosaprepitant (EMEND) 150 mg in sodium chloride 0.9 % 145 mL IVPB, 150 mg, Intravenous, Once, Sindy Guadeloupe, MD, Last Rate: 450 mL/hr at 02/08/21 1010, 150 mg at 02/08/21 1010   heparin lock flush 100 UNIT/ML injection, , , ,    heparin lock flush 100 unit/mL, 500 Units, Intracatheter, Once PRN, Sindy Guadeloupe, MD   iron sucrose (VENOFER) injection 200 mg, 200 mg, Intravenous, Once, Borders, Kirt Boys, NP   PACLitaxel (TAXOL) 312 mg in sodium chloride 0.9 % 500 mL chemo infusion (> 80mg /m2), 175 mg/m2 (Order-Specific), Intravenous, Once, Sindy Guadeloupe, MD  Physical exam:  Vitals:   02/08/21 0837  BP: (!) 141/76  Pulse: 98  Resp: 16  Temp: 98.3 F (36.8 C)   TempSrc: Oral  Weight: 152 lb 6.4 oz (69.1 kg)  Height: 5\' 3"  (1.6 m)   Physical Exam Constitutional:      General: She is not in acute distress. HENT:     Mouth/Throat:     Comments: Solitary ulceration in the floor of mouth Cardiovascular:     Rate and Rhythm: Normal rate and regular rhythm.     Heart sounds: Normal heart sounds.  Pulmonary:     Effort: Pulmonary effort is normal.     Breath sounds: Normal breath sounds.  Abdominal:     General: Bowel sounds are normal.     Palpations: Abdomen is soft.  Skin:    General: Skin is warm and dry.  Neurological:     Mental Status: She is alert and oriented to person, place, and time.     CMP Latest Ref Rng & Units 02/08/2021  Glucose 70 - 99 mg/dL 118(H)  BUN 8 - 23 mg/dL 18  Creatinine 0.44 - 1.00 mg/dL 0.41(L)  Sodium 135 - 145 mmol/L 130(L)  Potassium 3.5 - 5.1 mmol/L 3.9  Chloride 98 - 111 mmol/L 104  CO2 22 - 32 mmol/L 21(L)  Calcium 8.9 - 10.3 mg/dL 8.4(L)  Total Protein 6.5 - 8.1 g/dL 7.0  Total Bilirubin 0.3 - 1.2 mg/dL 0.1(L)  Alkaline Phos 38 - 126 U/L 106  AST 15 - 41 U/L 26  ALT 0 - 44 U/L 29   CBC Latest Ref Rng & Units 02/08/2021  WBC 4.0 - 10.5 K/uL 9.0  Hemoglobin 12.0 - 15.0 g/dL 10.8(L)  Hematocrit 36.0 - 46.0 % 34.1(L)  Platelets 150 - 400 K/uL 484(H)    No images are attached to the encounter.  NM PET Image Restag (PS) Skull Base To Thigh  Result Date: 01/28/2021 CLINICAL DATA:  Initial treatment strategy for high-grade serous carcinoma. EXAM: NUCLEAR MEDICINE PET SKULL BASE TO THIGH TECHNIQUE: 8.7 mCi F-18 FDG was injected intravenously. Full-ring PET imaging was performed from the skull base to thigh after the radiotracer. CT data was obtained and used for attenuation correction and anatomic localization. Fasting blood glucose: 89 mg/dl COMPARISON:  CT scan 01/01/2021 FINDINGS: Mediastinal blood pool activity: SUV max 2.0 Liver activity: SUV max NA NECK: Activity localizing along the left basal  ganglia and right mid brain on the top most image is probably artifactual given the lack of correlate of CT finding on the  CT data. Incidental CT findings: none CHEST: Small bilateral pleural effusions are symmetric, an without abnormal foci of accentuated activity along the pleural margins. Borderline cardiomegaly. Right Port-A-Cath tip: Cavoatrial junction. Mild atherosclerotic calcification of the aortic arch and branch vessels. Incidental CT findings: Questionable 3 mm left lower lobe nodule on image 92 series 3, not visible on 01/01/2021 although possibly obscured by atelectasis. ABDOMEN/PELVIS: Moderate (but substantially reduced) ascites observed with hypermetabolic omental caking of tumor. For example, the anterior rind of omental tumor caking shown on image 179 of series 3 measures about 3.7 cm in anterior-posterior and has a representative regional maximum SUV of 5.5. Irregular soft tissue density along the left adnexa measuring about 3.4 by 2.6 cm on image 219 series 3 has maximum SUV of 4.3. Activity along the right posterolateral margin of the ascites near the uterine fundus has a maximum SUV of 6.5. There is vague and low-grade activity along other likely sites of tumor lining peritoneal margins. A left external iliac node measuring 0.5 cm in short axis on image 220 series 3 has a maximum SUV of 2.1. There is some nodularity along the gastrohepatic ligament with maximum SUV of 3.7. Incidental CT findings: 2.1 cm gallstone in the gallbladder. Prominence of stool in the proximal colon although not substantially in the distal colon. Atherosclerosis is present, including aortoiliac atherosclerotic disease. SKELETON: Diffuse low-grade activity in the marrow is likely benign. No focal lesion identified. Incidental CT findings: Degenerative disc disease particularly at L2-3. Mild grade 1 degenerative anterolisthesis at L4-5. IMPRESSION: 1. Moderate amount of malignant ascites, with maximum SUV along the  dominant anterior rind of omental tumor caking about 5.5. Scattered tumor along peritoneal surfaces. 2. Small bilateral pleural effusions are symmetric and without abnormal foci of attenuated activity along the margins. 3. Likely artifactual activity on the top most image along the left basal ganglia and right mid brain. The patient has any neurologic deficit than I would suggest brain MRI with and without contrast, but given the lack of correlate of CT findings the top image findings on the PET data are likely artifactual. 4. 3 mm left lower lobe nodule on image 92 series 3, may merit surveillance. 5. Other imaging findings of potential clinical significance: Aortic Atherosclerosis (ICD10-I70.0). Cholelithiasis. Electronically Signed   By: Van Clines M.D.   On: 01/28/2021 15:53   IR IMAGING GUIDED PORT INSERTION  Result Date: 01/17/2021 INDICATION: ovarian cancer needs port EXAM: 1. Ultrasound-guided puncture of the right internal jugular vein 2. Placement of a right-sided chest port using fluoroscopic guidance MEDICATIONS: None ANESTHESIA/SEDATION: Moderate (conscious) sedation was employed during this procedure. A total of Versed 2 mg and Fentanyl 100 mcg was administered intravenously. Moderate Sedation Time: 18 minutes. The patient's level of consciousness and vital signs were monitored continuously by radiology nursing throughout the procedure under my direct supervision. FLUOROSCOPY TIME:  Fluoroscopy Time: 0 minutes 18 seconds (1.2 mGy). COMPLICATIONS: None immediate. PROCEDURE: Informed written consent was obtained from the patient after a thorough discussion of the procedural risks, benefits and alternatives. All questions were addressed. Maximal Sterile Barrier Technique was utilized including caps, mask, sterile gowns, sterile gloves, sterile drape, hand hygiene and skin antiseptic. A timeout was performed prior to the initiation of the procedure. The patient was placed supine on the exam  table. The right neck and chest was prepped and draped in the standard sterile fashion. A preliminary ultrasound of the right neck was performed and demonstrates a patent right internal jugular vein. A permanent ultrasound  image was stored in the electronic medical record. The overlying skin was anesthetized with 1% Lidocaine. Using ultrasound guidance, access was obtained into the right internal jugular vein using a 21 gauge micropuncture set. A wire was advanced into the SVC, a short incision was made at the puncture site, and serial dilatation performed. Next, in an ipsilateral infraclavicular location, an incision was made at the site of the subcutaneous reservoir. Blunt dissection was used to open a pocket to contain the reservoir. A subcutaneous tunnel was then created from the port site to the puncture site. A(n) 8 Fr single lumen catheter was advanced through the tunnel. The catheter was attached to the port and this was placed in the subcutaneous pocket. Under fluoroscopic guidance, a peel away sheath was placed, and the catheter was trimmed to the appropriate length and was advanced into the central veins. The catheter length is 20 cm. The tip of the catheter lies near the superior cavoatrial junction. The port flushes and aspirates appropriately. The port was flushed and locked with heparinized saline. The port pocket was closed in 2 layers using 3-0 and 4-0 Vicryl/absorbable suture. Dermabond was also applied to both incisions. The patient tolerated the procedure well and was transferred to recovery in stable condition. IMPRESSION: Successful placement of a right chest port via the right internal jugular vein. The port is ready immediate use. Electronically Signed   By: Albin Felling M.D.   On: 01/17/2021 13:22   IR Paracentesis  Result Date: 01/17/2021 INDICATION: malignant ascites EXAM: ULTRASOUND GUIDED  PARACENTESIS MEDICATIONS: None. COMPLICATIONS: None immediate. PROCEDURE: Informed written  consent was obtained from the patient after a discussion of the risks, benefits and alternatives to treatment. A timeout was performed prior to the initiation of the procedure. Initial ultrasound scanning demonstrates a large amount of ascites within the right lower abdominal quadrant. The right lower abdomen was prepped and draped in the usual sterile fashion. 1% lidocaine was used for local anesthesia. Following this, a 19 gauge 10 cm Yueh catheter was introduced. An ultrasound image was saved for documentation purposes. The paracentesis was performed. The catheter was removed and a dressing was applied. The patient tolerated the procedure well without immediate post procedural complication. FINDINGS: A total of approximately 9.8 L of clear, dark amber ascites fluid was removed. IMPRESSION: Successful ultrasound-guided paracentesis yielding 9.8 L of peritoneal fluid. Electronically Signed   By: Albin Felling M.D.   On: 01/17/2021 13:18     Assessment and plan- Patient is a 76 y.o. 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  Urine protein from today is pendingHer last urine protein was 49 which was acceptable to proceed with bevacizumab.  Blood pressure is otherwise stable CA125 has trended downFrom 2336 a month ago to 691 2 weeks ago.  I will see her back in 3 weeks for cycle 3 of CarboTaxol bevacizumab.  No Neulasta with this cycle.  We will repeat CT chest abdomen pelvis with contrast in about 4 to 5 weeks time.  Patient is on Eliquis 2.5 mg twice daily for thromboprophylaxis.  B12 deficiency: She has received 3 doses of B12 injections so far and she will receive her fourth dose today and we will plan to give it to her every 3 weeks with chemotherapy.  Cancer induced fatigue: Discussed  with the  patient that there is no specific medication that I can give her to improve fatigue.  Recommend being more active and trying simple exercises to improve stamina  Mucositis: Solitary ulceration noted in the floor of the mouth for which I have recommended Biotene mouthwash for now   Visit Diagnosis 1. Malignant neoplasm of ovary, unspecified laterality ([place])   2. Encounter for antineoplastic chemotherapy   3. Encounter for monoclonal antibody treatment for malignancy      Dr. Randa Evens, MD, MPH [facility] [phone] 02/08/2021 10:11 AM

## 2021-02-08 NOTE — Progress Notes (Signed)
B12 given 1/11, 1/17 and 1/27 and ordered again today but plan states to give q4w.  Verified with MD, wants to give again today as patient levels are low.

## 2021-02-08 NOTE — Progress Notes (Signed)
Pt states that she had lots of pain from inj. And it lasted a week and she got pain med that helped. She does not want the inj. Again. She says she is taking claritin every day  Pt still has numbness of finger tips. She has sinus HA today and she wants to take tylenol. She has some numbness/tingling in finger tips

## 2021-02-08 NOTE — Patient Instructions (Signed)
[facility]  Discharge Instructions: °Thank you for choosing [facility] to provide your oncology and hematology care.  ° °If you have a lab appointment with the Cancer Center, please go directly to the Cancer Center and check in at the registration area. °  °Wear comfortable clothing and clothing appropriate for easy access to any Portacath or PICC line.  ° °We strive to give you quality time with your provider. You may need to reschedule your appointment if you arrive late (15 or more minutes).  Arriving late affects you and other patients whose appointments are after yours.  Also, if you miss three or more appointments without notifying the office, you may be dismissed from the clinic at the provider’s discretion.    °  °For prescription refill requests, have your pharmacy contact our office and allow 72 hours for refills to be completed.   ° °Today you received the following chemotherapy and/or immunotherapy agents     °  °To help prevent nausea and vomiting after your treatment, we encourage you to take your nausea medication as directed. ° °BELOW ARE SYMPTOMS THAT SHOULD BE REPORTED IMMEDIATELY: °*FEVER GREATER THAN 100.4 F (38 °C) OR HIGHER °*CHILLS OR SWEATING °*NAUSEA AND VOMITING THAT IS NOT CONTROLLED WITH YOUR NAUSEA MEDICATION °*UNUSUAL SHORTNESS OF BREATH °*UNUSUAL BRUISING OR BLEEDING °*URINARY PROBLEMS (pain or burning when urinating, or frequent urination) °*BOWEL PROBLEMS (unusual diarrhea, constipation, pain near the anus) °TENDERNESS IN MOUTH AND THROAT WITH OR WITHOUT PRESENCE OF ULCERS (sore throat, sores in mouth, or a toothache) °UNUSUAL RASH, SWELLING OR PAIN  °UNUSUAL VAGINAL DISCHARGE OR ITCHING  ° °Items with * indicate a potential emergency and should be followed up as soon as possible or go to the Emergency Department if any problems should occur. ° °Please show the CHEMOTHERAPY ALERT CARD or IMMUNOTHERAPY ALERT CARD at check-in to the  Emergency Department and triage nurse. ° °Should you have questions after your visit or need to cancel or reschedule your appointment, please contact [facility]  Dept: [phone]  and follow the prompts.  Office hours are 8:00 a.m. to 4:30 p.m. Monday - Friday. Please note that voicemails left after 4:00 p.m. may not be returned until the following business day.  We are closed weekends and major holidays. You have access to a nurse at all times for urgent questions. Please call the main number to the clinic Dept: [phone] and follow the prompts. ° ° °For any non-urgent questions, you may also contact your provider using MyChart. We now offer e-Visits for anyone 18 and older to request care online for non-urgent symptoms. For details visit [email or web address]. °  °Also download the MyChart app! Go to the app store, search "MyChart", open the app, select [facility], and log in with your MyChart username and password. ° °Due to Covid, a mask is required upon entering the hospital/clinic. If you do not have a mask, one will be given to you upon arrival. For doctor visits, patients may have 1 support person aged 18 or older with them. For treatment visits, patients cannot have anyone with them due to current Covid guidelines and our immunocompromised population.  ° °

## 2021-02-08 NOTE — Progress Notes (Signed)
Nutrition Follow-up:  Patient with stage IV high grade serous carcinoma likely ovary origin.  Patient receiving neoadjuvant chemotherapy of carboplatin/taxol.  Met with patient during infusion.  Patient reports that her appetite is good.  Denies any nutrition impact symptoms.  Has been trying to eat good sources of protein.  Usually eats 2 eggs and 2 pieces of toast for breakfast.  Has peanut butter and jelly sandwich for lunch or son cooks.  Dinner she will drink a protein shake (ensure max protein) or prepares something.  Son is currently staying with her from [place].     Medications: reviewed  Labs: reviewed  Anthropometrics:   Weight 152 lb 6.4 oz today (last paracentesis on 01/17/21)  159 lb 1.6 oz on 1/17 (fluid removed day before) 179 lb 1 year ago   NUTRITION DIAGNOSIS: Unintentional weight loss related to cancer/fluid collection as evidenced by 11% weight loss in the last year.     INTERVENTION:  Encouraged 350 calorie shake to provide additional calories to prevent weight loss. Encouraged continued good intake of protein rich foods.    MONITORING, EVALUATION, GOAL: weight trends, intake   NEXT VISIT: Tuesday, Feb 27 during infusion  Lewis Grivas B. Zenia Resides, [place], [place] Registered Dietitian [phone] (mobile)

## 2021-02-10 ENCOUNTER — Inpatient Hospital Stay: Payer: Medicare PPO

## 2021-02-14 ENCOUNTER — Telehealth: Payer: Self-pay | Admitting: Oncology

## 2021-02-14 ENCOUNTER — Telehealth: Payer: Self-pay | Admitting: *Deleted

## 2021-02-14 MED ORDER — DEXAMETHASONE 2 MG PO TABS: 2.0000 mg | ORAL_TABLET | Freq: Every day | ORAL | 1 refills | Status: DC

## 2021-02-14 NOTE — Telephone Encounter (Signed)
Dr Janese Banks had told me that pt could have steroids but then wanted Josh to call and see about her symptoms and Josh did call her and made changes to help her

## 2021-02-14 NOTE — Telephone Encounter (Signed)
Can you call her? We could try short course of steroids but I dont think it will help long term

## 2021-02-14 NOTE — Telephone Encounter (Signed)
Patient states that she has "no energy to do anything at all" and is "at the end of her rope". She would like a return call from a clinical staff member to discuss.

## 2021-02-14 NOTE — Telephone Encounter (Signed)
Spoke with patient and son.  Patient endorses persistent fatigue without any other acute changes including fever or chills.  She is sleeping adequately at bedtime with Seroquel/Klonopin.  Patient did tolerate prednisone last month and felt better on it.  We will start her on low-dose dexamethasone.  We also discussed future option of American ginseng 2000 mg daily if needed.

## 2021-02-14 NOTE — Telephone Encounter (Signed)
Patient has no energy at all. Son is concerned and made her call us. She states that Lauren told her she probably would not get her energy back. She denies unsteady gait, or knees giving out and states she can walk short distances without a problem, just that she is not having any energy at all. She states she has tried to do things as instructed in chemotherapy class to get energy, but she is not getting any. Please advise

## 2021-02-15 ENCOUNTER — Encounter: Payer: Self-pay | Admitting: Oncology

## 2021-02-21 ENCOUNTER — Other Ambulatory Visit: Payer: Self-pay | Admitting: Oncology

## 2021-02-21 DIAGNOSIS — C569 Malignant neoplasm of unspecified ovary: Secondary | ICD-10-CM

## 2021-02-22 ENCOUNTER — Ambulatory Visit: Payer: Self-pay | Admitting: Licensed Clinical Social Worker

## 2021-02-22 ENCOUNTER — Encounter: Payer: Self-pay | Admitting: Licensed Clinical Social Worker

## 2021-02-22 ENCOUNTER — Telehealth: Payer: Self-pay | Admitting: Licensed Clinical Social Worker

## 2021-02-22 DIAGNOSIS — Z1379 Encounter for other screening for genetic and chromosomal anomalies: Secondary | ICD-10-CM

## 2021-02-22 NOTE — Progress Notes (Signed)
HPI:  Ms. Bergevin was previously seen in the [place] clinic due to a personal and family history of cancer and concerns regarding a hereditary predisposition to cancer. Please refer to our prior cancer genetics clinic note for more information regarding our discussion, assessment and recommendations, at the time. Ms. Sliwa recent genetic test results were disclosed to her, as were recommendations warranted by these results. These results and recommendations are discussed in more detail below.  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 No pathogenic variants identified on the [facility] Multi-Cancer+RNA panel (germline testing). 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 The report date is 02/21/2021.  The Multi-Cancer Panel + RNA offered by [facility] includes sequencing and/or deletion duplication testing of the following 84 genes: AIP, ALK, APC, ATM, AXIN2,BAP1,  BARD1, BLM, BMPR1A, BRCA1, BRCA2, BRIP1, CASR, CDC73, CDH1, CDK4, CDKN1B, CDKN1C, CDKN2A (p14ARF), CDKN2A (p16INK4a), CEBPA, CHEK2, CTNNA1, DICER1, DIS3L2, EGFR (c.2369C>T, p.Thr790Met variant only), EPCAM (Deletion/duplication testing only), FH, FLCN, GATA2, GPC3, GREM1 (Promoter region deletion/duplication testing only), HOXB13 (c.251G>A, p.Gly84Glu), HRAS, KIT, MAX, MEN1, MET, MITF (c.952G>A, p.Glu318Lys variant only), MLH1, MSH2, MSH3, MSH6, MUTYH, NBN, NF1, NF2, NTHL1, PALB2, PDGFRA, PHOX2B, PMS2, POLD1, POLE, POT1, PRKAR1A, PTCH1, PTEN,  RAD50, RAD51C, RAD51D, RB1, RECQL4, RET, RUNX1, SDHAF2, SDHA (sequence changes only), SDHB, SDHC, SDHD, SMAD4, SMARCA4, SMARCB1, SMARCE1, STK11, SUFU, TERC, TERT, TMEM127, TP53, TSC1, TSC2, VHL, WRN and WT1.  Negative HRD testing (somatic) through [facility]. The report date is 02/14/2021.     FAMILY HISTORY:  We obtained a detailed, 4-generation family history.  Significant diagnoses are listed below: Family History  Problem Relation Age of Onset   Bladder Cancer Mother    Heart attack Father    Breast cancer Maternal Grandmother 80   Kidney disease Neg Hx     Ms. Kiernan has 3 sons, 67, 46 and 49, no cancers. She has 3 granddaughters and 3 grandsons, no cancers. Ms. Platt does not have siblings.   Ms. Larkin mother died in her 53s of bladder cancer, she did not have history of smoking. Patient had 1 maternal uncle, 2 aunts. One aunt did pass from leukemia as a young adult. Maternal grandmother had breast cancer in her 58s and died in her 31s. Grandfather died over 58.   Ms. Comunale father died of a heart attack in his early 48s. Patient had 1 paternal uncle, 1 aunt, no cancers. No cancers for paternal grandparents.    Ms. Rexroad is unaware of previous family history of genetic testing for hereditary cancer risks. Patient's maternal ancestors are of unknown descent, and paternal ancestors are of unknown descent. There is no reported Ashkenazi Jewish ancestry. There is no known consanguinity.      GENETIC TEST RESULTS: Genetic testing reported out on 02/21/2021 through the [facility] Multi-Cancer+RNA cancer panel found no pathogenic mutations.   The Multi-Cancer Panel + RNA offered by [facility] includes sequencing and/or deletion duplication testing of the following 84 genes: AIP, ALK, APC,  ATM, AXIN2,BAP1,  BARD1, BLM, BMPR1A, BRCA1, BRCA2, BRIP1, CASR, CDC73, CDH1, CDK4, CDKN1B, CDKN1C, CDKN2A (p14ARF), CDKN2A (p16INK4a), CEBPA, CHEK2, CTNNA1, DICER1, DIS3L2, EGFR (c.2369C>T,  p.Thr790Met variant only), EPCAM (Deletion/duplication testing only), FH, FLCN, GATA2, GPC3, GREM1 (Promoter region deletion/duplication testing only), HOXB13 (c.251G>A, p.Gly84Glu), HRAS, KIT, MAX, MEN1, MET, MITF (c.952G>A, p.Glu318Lys variant only), MLH1, MSH2, MSH3, MSH6, MUTYH, NBN, NF1, NF2, NTHL1, PALB2, PDGFRA, PHOX2B, PMS2, POLD1, POLE, POT1, PRKAR1A, PTCH1, PTEN, RAD50, RAD51C, RAD51D, RB1, RECQL4, RET, RUNX1, SDHAF2, SDHA (sequence changes only), SDHB, SDHC, SDHD, SMAD4, SMARCA4, SMARCB1, SMARCE1, STK11, SUFU, TERC, TERT, TMEM127, TP53, TSC1, TSC2, VHL, WRN and WT1  [facility] HRD testing through [facility] was also negative.   The test report has been scanned into EPIC and is located under the Molecular Pathology section of the Results Review tab.  A portion of the result report is included below for reference.        We discussed that because current genetic testing is not perfect, it is possible there may be a gene mutation in one of these genes that current testing cannot detect, but that chance is small.  There could be another gene that has not yet been discovered, or that we have not yet tested, that is responsible for the cancer diagnoses in the family. It is also possible there is a hereditary cause for the cancer in the family that Ms. Tates did not inherit and therefore was not identified in her testing.  Therefore, it is important to remain in touch with cancer genetics in the future so that we can continue to offer Ms. Korol the most up to date genetic testing.   Genetic testing did identify a variant of uncertain significance (VUS) in the RET gene called c.1531G>A. At this time, it is unknown if this variant is associated with increased cancer risk or if this is a normal finding, but most variants such as this get reclassified to being inconsequential. It should not be used to make medical management decisions. With time, we suspect the lab will determine the significance of  this variant, if any. If we do learn more about it we will try to contact Ms. Ahmad to discuss it further. However, it is important to stay in touch with [place] periodically and keep the address and phone number up to date.  ADDITIONAL GENETIC TESTING: We discussed with Ms. Shor that her genetic testing was fairly extensive.  If there are genes identified to increase cancer risk that can be analyzed in the future, we would be happy to discuss and coordinate this testing at that time.    CANCER SCREENING RECOMMENDATIONS: Ms. Badger test result is considered negative (normal).  This means that we have not identified a hereditary cause for her  personal and family history of cancer at this time. Most cancers happen by chance and this negative test suggests that her cancer may fall into this category.    While reassuring, this does not definitively rule out a hereditary predisposition to cancer. It is still possible that there could be genetic mutations that are undetectable by current technology. There could be genetic mutations in genes that have not been tested or identified to increase cancer risk.  Therefore, it is recommended she continue to follow the cancer management and screening guidelines provided by her oncology and primary healthcare provider.   An individual's cancer risk and medical management are not determined by genetic test results alone. Overall cancer risk assessment incorporates additional factors, including personal medical history,  family history, and any available genetic information that may result in a personalized plan for cancer prevention and surveillance.  RECOMMENDATIONS FOR FAMILY MEMBERS:  Relatives in this family might be at some increased risk of developing cancer, over the general population risk, simply due to the family history of cancer.  We recommended female relatives in this family have a yearly mammogram beginning at age 55, or 49 years younger than the earliest  onset of cancer, an annual clinical breast exam, and perform monthly breast self-exams. Female relatives in this family should also have a gynecological exam as recommended by their primary provider.  All family members should be referred for colonoscopy starting at age 45.   FOLLOW-UP: Lastly, we discussed with Ms. Chisolm that cancer genetics is a rapidly advancing field and it is possible that new genetic tests will be appropriate for her and/or her family members in the future. We encouraged her to remain in contact with cancer genetics on an annual basis so we can update her personal and family histories and let her know of advances in cancer genetics that may benefit this family.   Our contact number was provided. Ms. Gambrel questions were answered to her satisfaction, and she knows she is welcome to call us at anytime with additional questions or concerns.   Faith Rogue, MS, [facility] Genetic Counselor [email or web address] Phone: [phone]

## 2021-02-22 NOTE — Telephone Encounter (Signed)
Revealed negative genetic testing on both the [facility] Multi-Cancer Panel (germline) and [facility] HRD (somatic).  Revealed that a VUS in RET was identified. This normal result is reassuring and indicates that it is unlikely Kathy Morton's cancer is due to a hereditary cause.  It is unlikely that there is an increased risk of another cancer due to a mutation in one of these genes.  However, genetic testing is not perfect, and cannot definitively rule out a hereditary cause.  It will be important for her to keep in contact with genetics to learn if any additional testing may be needed in the future.

## 2021-02-28 ENCOUNTER — Inpatient Hospital Stay (HOSPITAL_BASED_OUTPATIENT_CLINIC_OR_DEPARTMENT_OTHER): Payer: Medicare PPO | Admitting: Hospice and Palliative Medicine

## 2021-02-28 DIAGNOSIS — C569 Malignant neoplasm of unspecified ovary: Secondary | ICD-10-CM | POA: Diagnosis not present

## 2021-02-28 DIAGNOSIS — G893 Neoplasm related pain (acute) (chronic): Secondary | ICD-10-CM | POA: Diagnosis not present

## 2021-02-28 DIAGNOSIS — Z515 Encounter for palliative care: Secondary | ICD-10-CM

## 2021-02-28 MED ORDER — DULOXETINE HCL 60 MG PO CPEP: 60.0000 mg | ORAL_CAPSULE | Freq: Every day | ORAL | 3 refills | Status: DC

## 2021-02-28 NOTE — Progress Notes (Signed)
Virtual Visit via Telephone Note  I connected with Kathy Morton on 02/28/21 at  1:20 PM EST by telephone and verified that I am speaking with the correct person using two identifiers.  Location: Patient: Home Provider: Clinic   I discussed the limitations, risks, security and privacy concerns of performing an evaluation and management service by telephone and the availability of in person appointments. I also discussed with the patient that there may be a patient responsible charge related to this service. The patient expressed understanding and agreed to proceed.   History of Present Illness: Kathy Morton is a 76 y.o. 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  Patient has had pain and anxiety.  She is referred to palliative care to help address goals and manage ongoing symptoms.   Observations/Objective: I called and spoke with patient by phone.  She reports that she is doing reasonably well.  She denies significant changes or concerns.  She endorses improved anxiety after starting duloxetine.  However, she still endorses numbness/tingling in her fingertips.  She denies overt pain.  No other significant symptomatic complaints at present.  Assessment and Plan: Stage IV ovarian cancer -on treatment with carbo/Taxol/Bev of chemotherapy.  Patient has follow-up scheduled tomorrow with Dr. Janese Banks  Anxiety -this seems improved on duloxetine/Klonopin  Chemo-induced peripheral neuropathy -we will trial increase of duloxetine to 60 mg daily  Follow Up Instructions: Follow-up telephone visit 1 month   I discussed the assessment and treatment plan with the patient. The patient was provided an opportunity to ask questions and all were answered. The patient agreed with the plan and demonstrated an understanding of the instructions.   The patient was advised  to call back or seek an in-person evaluation if the symptoms worsen or if the condition fails to improve as anticipated.  I provided 5 minutes of non-face-to-face time during this encounter.   Irean Hong, NP

## 2021-03-01 ENCOUNTER — Inpatient Hospital Stay: Payer: Medicare PPO

## 2021-03-01 ENCOUNTER — Encounter: Payer: Self-pay | Admitting: Oncology

## 2021-03-01 ENCOUNTER — Inpatient Hospital Stay (HOSPITAL_BASED_OUTPATIENT_CLINIC_OR_DEPARTMENT_OTHER): Payer: Medicare PPO | Admitting: Oncology

## 2021-03-01 ENCOUNTER — Other Ambulatory Visit: Payer: Self-pay

## 2021-03-01 VITALS — BP 141/96 | HR 108 | Temp 96.8°F | Resp 16 | Ht 63.0 in | Wt 143.8 lb

## 2021-03-01 VITALS — BP 135/84 | HR 96

## 2021-03-01 DIAGNOSIS — C569 Malignant neoplasm of unspecified ovary: Secondary | ICD-10-CM | POA: Diagnosis not present

## 2021-03-01 DIAGNOSIS — R530 Neoplastic (malignant) related fatigue: Secondary | ICD-10-CM

## 2021-03-01 DIAGNOSIS — E538 Deficiency of other specified B group vitamins: Secondary | ICD-10-CM | POA: Diagnosis not present

## 2021-03-01 DIAGNOSIS — Z5112 Encounter for antineoplastic immunotherapy: Secondary | ICD-10-CM | POA: Diagnosis not present

## 2021-03-01 DIAGNOSIS — G620 Drug-induced polyneuropathy: Secondary | ICD-10-CM

## 2021-03-01 DIAGNOSIS — K1230 Oral mucositis (ulcerative), unspecified: Secondary | ICD-10-CM | POA: Diagnosis not present

## 2021-03-01 DIAGNOSIS — F419 Anxiety disorder, unspecified: Secondary | ICD-10-CM | POA: Diagnosis not present

## 2021-03-01 DIAGNOSIS — D508 Other iron deficiency anemias: Secondary | ICD-10-CM

## 2021-03-01 DIAGNOSIS — Z5111 Encounter for antineoplastic chemotherapy: Secondary | ICD-10-CM | POA: Diagnosis not present

## 2021-03-01 DIAGNOSIS — T451X5A Adverse effect of antineoplastic and immunosuppressive drugs, initial encounter: Secondary | ICD-10-CM | POA: Diagnosis not present

## 2021-03-01 DIAGNOSIS — C786 Secondary malignant neoplasm of retroperitoneum and peritoneum: Secondary | ICD-10-CM | POA: Diagnosis not present

## 2021-03-01 DIAGNOSIS — Z7901 Long term (current) use of anticoagulants: Secondary | ICD-10-CM | POA: Diagnosis not present

## 2021-03-01 LAB — CBC WITH DIFFERENTIAL/PLATELET
Abs Immature Granulocytes: 0.06 10*3/uL (ref 0.00–0.07)
Basophils Absolute: 0.1 10*3/uL (ref 0.0–0.1)
Basophils Relative: 0 %
Eosinophils Absolute: 0 10*3/uL (ref 0.0–0.5)
Eosinophils Relative: 0 %
HCT: 40.5 % (ref 36.0–46.0)
Hemoglobin: 13.4 g/dL (ref 12.0–15.0)
Immature Granulocytes: 1 %
Lymphocytes Relative: 10 %
Lymphs Abs: 1.4 10*3/uL (ref 0.7–4.0)
MCH: 31.2 pg (ref 26.0–34.0)
MCHC: 33.1 g/dL (ref 30.0–36.0)
MCV: 94.2 fL (ref 80.0–100.0)
Monocytes Absolute: 0.9 10*3/uL (ref 0.1–1.0)
Monocytes Relative: 7 %
Neutro Abs: 10.7 10*3/uL — ABNORMAL HIGH (ref 1.7–7.7)
Neutrophils Relative %: 82 %
Platelets: 333 10*3/uL (ref 150–400)
RBC: 4.3 MIL/uL (ref 3.87–5.11)
RDW: 20.5 % — ABNORMAL HIGH (ref 11.5–15.5)
WBC: 13.1 10*3/uL — ABNORMAL HIGH (ref 4.0–10.5)
nRBC: 0 % (ref 0.0–0.2)

## 2021-03-01 LAB — COMPREHENSIVE METABOLIC PANEL
ALT: 27 U/L (ref 0–44)
AST: 25 U/L (ref 15–41)
Albumin: 3.9 g/dL (ref 3.5–5.0)
Alkaline Phosphatase: 69 U/L (ref 38–126)
Anion gap: 8 (ref 5–15)
BUN: 27 mg/dL — ABNORMAL HIGH (ref 8–23)
CO2: 26 mmol/L (ref 22–32)
Calcium: 9.4 mg/dL (ref 8.9–10.3)
Chloride: 96 mmol/L — ABNORMAL LOW (ref 98–111)
Creatinine, Ser: 0.48 mg/dL (ref 0.44–1.00)
GFR, Estimated: >60 mL/min (ref >60)
Glucose, Bld: 122 mg/dL — ABNORMAL HIGH (ref 70–99)
Potassium: 3.8 mmol/L (ref 3.5–5.1)
Sodium: 130 mmol/L — ABNORMAL LOW (ref 135–145)
Total Bilirubin: 0.4 mg/dL (ref 0.3–1.2)
Total Protein: 7 g/dL (ref 6.5–8.1)

## 2021-03-01 LAB — PROTEIN, URINE, RANDOM: Total Protein, Urine: 54 mg/dL

## 2021-03-01 MED ORDER — SODIUM CHLORIDE 0.9 % IV SOLN
10.0000 mg | Freq: Once | INTRAVENOUS | Status: AC
  Administered 2021-03-01: 10 mg via INTRAVENOUS
  Filled 2021-03-01: qty 10

## 2021-03-01 MED ORDER — HEPARIN SOD (PORK) LOCK FLUSH 100 UNIT/ML IV SOLN
500.0000 [IU] | Freq: Once | INTRAVENOUS | Status: DC | PRN
  Filled 2021-03-01: qty 5

## 2021-03-01 MED ORDER — HEPARIN SOD (PORK) LOCK FLUSH 100 UNIT/ML IV SOLN
INTRAVENOUS | Status: AC
  Filled 2021-03-01: qty 5

## 2021-03-01 MED ORDER — FAMOTIDINE IN NACL 20-0.9 MG/50ML-% IV SOLN
20.0000 mg | Freq: Once | INTRAVENOUS | Status: AC
  Administered 2021-03-01: 20 mg via INTRAVENOUS
  Filled 2021-03-01: qty 50

## 2021-03-01 MED ORDER — SODIUM CHLORIDE 0.9 % IV SOLN
1000.0000 mg | Freq: Once | INTRAVENOUS | Status: AC
  Administered 2021-03-01: 1000 mg via INTRAVENOUS
  Filled 2021-03-01: qty 32

## 2021-03-01 MED ORDER — SODIUM CHLORIDE 0.9 % IV SOLN
Freq: Once | INTRAVENOUS | Status: AC
  Filled 2021-03-01: qty 250

## 2021-03-01 MED ORDER — SODIUM CHLORIDE 0.9 % IV SOLN
300.0000 mg | Freq: Once | INTRAVENOUS | Status: AC
  Administered 2021-03-01: 300 mg via INTRAVENOUS
  Filled 2021-03-01: qty 30

## 2021-03-01 MED ORDER — SODIUM CHLORIDE 0.9 % IV SOLN
150.0000 mg/m2 | Freq: Once | INTRAVENOUS | Status: AC
  Administered 2021-03-01: 270 mg via INTRAVENOUS
  Filled 2021-03-01: qty 45

## 2021-03-01 MED ORDER — DIPHENHYDRAMINE HCL 50 MG/ML IJ SOLN
50.0000 mg | Freq: Once | INTRAMUSCULAR | Status: AC
  Administered 2021-03-01: 50 mg via INTRAVENOUS
  Filled 2021-03-01: qty 1

## 2021-03-01 MED ORDER — CYANOCOBALAMIN 1000 MCG/ML IJ SOLN
1000.0000 ug | INTRAMUSCULAR | Status: DC
  Administered 2021-03-01: 1000 ug via INTRAMUSCULAR
  Filled 2021-03-01: qty 1

## 2021-03-01 MED ORDER — IRON SUCROSE 20 MG/ML IV SOLN
200.0000 mg | Freq: Once | INTRAVENOUS | Status: AC
  Administered 2021-03-01: 200 mg via INTRAVENOUS
  Filled 2021-03-01: qty 10

## 2021-03-01 MED ORDER — SODIUM CHLORIDE 0.9 % IV SOLN
150.0000 mg | Freq: Once | INTRAVENOUS | Status: AC
  Administered 2021-03-01: 150 mg via INTRAVENOUS
  Filled 2021-03-01: qty 150

## 2021-03-01 MED ORDER — SODIUM CHLORIDE 0.9 % IV SOLN: 200.0000 mg | INTRAVENOUS | Status: DC

## 2021-03-01 MED ORDER — PALONOSETRON HCL INJECTION 0.25 MG/5ML
0.2500 mg | Freq: Once | INTRAVENOUS | Status: AC
  Administered 2021-03-01: 0.25 mg via INTRAVENOUS
  Filled 2021-03-01: qty 5

## 2021-03-01 NOTE — Patient Instructions (Signed)
[facility] AT [place]  Discharge Instructions: Thank you for choosing [facility] to provide your oncology and hematology care.  If you have a lab appointment with the [facility], please go directly to the [facility] and check in at the registration area.  Wear comfortable clothing and clothing appropriate for easy access to any Portacath or PICC line.   We strive to give you quality time with your provider. You may need to reschedule your appointment if you arrive late (15 or more minutes).  Arriving late affects you and other patients whose appointments are after yours.  Also, if you miss three or more appointments without notifying the office, you may be dismissed from the clinic at the providers discretion.      For prescription refill requests, have your pharmacy contact our office and allow 72 hours for refills to be completed.    Today you received the following chemotherapy and/or immunotherapy agents Avastin, Taxol, Carboplatin   To help prevent nausea and vomiting after your treatment, we encourage you to take your nausea medication as directed.  BELOW ARE SYMPTOMS THAT SHOULD BE REPORTED IMMEDIATELY: *FEVER GREATER THAN 100.4 F (38 C) OR HIGHER *CHILLS OR SWEATING *NAUSEA AND VOMITING THAT IS NOT CONTROLLED WITH YOUR NAUSEA MEDICATION *UNUSUAL SHORTNESS OF BREATH *UNUSUAL BRUISING OR BLEEDING *URINARY PROBLEMS (pain or burning when urinating, or frequent urination) *BOWEL PROBLEMS (unusual diarrhea, constipation, pain near the anus) TENDERNESS IN MOUTH AND THROAT WITH OR WITHOUT PRESENCE OF ULCERS (sore throat, sores in mouth, or a toothache) UNUSUAL RASH, SWELLING OR PAIN  UNUSUAL VAGINAL DISCHARGE OR ITCHING   Items with * indicate a potential emergency and should be followed up as soon as possible or go to the Emergency Department if any problems should occur.  Please show the CHEMOTHERAPY ALERT CARD or IMMUNOTHERAPY ALERT CARD  at check-in to the Emergency Department and triage nurse.  Should you have questions after your visit or need to cancel or reschedule your appointment, please contact [facility] CANCER [place] AT [place]  [phone] and follow the prompts.  Office hours are 8:00 a.m. to 4:30 p.m. Monday - Friday. Please note that voicemails left after 4:00 p.m. may not be returned until the following business day.  We are closed weekends and major holidays. You have access to a nurse at all times for urgent questions. Please call the main number to the clinic [phone] and follow the prompts.  For any non-urgent questions, you may also contact your provider using MyChart. We now offer e-Visits for anyone 1 and older to request care online for non-urgent symptoms. For details visit [email or web address].   Also download the MyChart app! Go to the app store, search "MyChart", open the app, select [facility], and log in with your MyChart username and password.  Due to Covid, a mask is required upon entering the hospital/clinic. If you do not have a mask, one will be given to you upon arrival. For doctor visits, patients may have 1 support person aged 101 or older with them. For treatment visits, patients cannot have anyone with them due to current Covid guidelines and our immunocompromised population.

## 2021-03-01 NOTE — Progress Notes (Signed)
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  She had CT angio chest which did not show any evidence of PE.  Low volume chest with small pleural effusions.  CT abdomen and pelvis with contrast showed tense ascites with features of underlying peritoneal carcinomatosis.  Asymmetric thickening and heterogeneity of the left ovary raising the concern for potential ovarian cancer.  Nodularity in the left groin which is likely tumor within the remnant inguinal canal.   Cytology consistent with high-grade serous carcinoma.The carcinoma is positive for PAX-8 and WT-1. Rare cells demonstrate  weak ER positivity. The cytomorphologic findings in conjunction with the  pattern of immunoreactivity are consistent with the above diagnosis. CA125 elevated at 2336.    Interval history-patient continues to report significant fatigue throughout the day.  She walks out of her house to help the dogs get out and then bring them back about 4-5 times a day and this itself exhausts her.  She is currently on American ginseng for her fatigue as well.  She has been taking nausea medications around-the-clock but does not really report any significant nausea.  She is currently on Cymbalta for her neuropathy.  Reports that her  neuropathy is getting better and her feet but worse in her hands  ECOG PS- 2 Pain scale- 0 Opioid associated constipation- no  Review of systems- Review of Systems  Constitutional:  Positive for malaise/fatigue. Negative for chills, fever and weight loss.  HENT:  Negative for congestion, ear discharge and nosebleeds.   Eyes:  Negative for blurred vision.  Respiratory:  Negative for cough, hemoptysis, sputum production, shortness of breath and wheezing.   Cardiovascular:  Negative for chest pain, palpitations, orthopnea and claudication.  Gastrointestinal:  Negative for abdominal pain, blood in stool, constipation, diarrhea, heartburn, melena, nausea and vomiting.  Genitourinary:  Negative for dysuria, flank pain, frequency, hematuria and urgency.  Musculoskeletal:  Negative for back pain, joint pain and myalgias.  Skin:  Negative for rash.  Neurological:  Positive for sensory change (Peripheral neuropathy). Negative for dizziness, tingling, focal weakness, seizures, weakness and headaches.  Endo/Heme/Allergies:  Does not bruise/bleed easily.  Psychiatric/Behavioral:  Negative for depression and suicidal ideas. The patient does not have insomnia.       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)      Past Surgical History:  Procedure Laterality Date   BREAST BIOPSY Left 01/06/13   benign finding of sclerosing adenosis  COLONOSCOPY WITH PROPOFOL N/A 11/29/2015   Procedure: COLONOSCOPY WITH PROPOFOL;  Surgeon: Jonathon Bellows, MD;  Location: [facility] ENDOSCOPY;  Service: Endoscopy;  Laterality: N/A;   IR IMAGING GUIDED PORT INSERTION  01/17/2021   IR PARACENTESIS  01/17/2021   KNEE SURGERY  Left 2010   torn meniscus   TONSILECTOMY, ADENOIDECTOMY, BILATERAL MYRINGOTOMY AND TUBES      Social History   Socioeconomic History   Marital status: Divorced    Spouse name: Not on file   Number of children: Not on file   Years of education: Not on file   Highest education level: Some college, no degree  Occupational History   Occupation: retired   Tobacco Use   Smoking status: Never   Smokeless tobacco: Never  Vaping Use   Vaping Use: Never used  Substance and Sexual Activity   Alcohol use: No   Drug use: No   Sexual activity: Not Currently    Birth control/protection: None  Other Topics Concern   Not on file  Social History Narrative   Worked at the [employer].   Social Determinants of Health   Financial Resource Strain: Not on file  Food Insecurity: Not on file  Transportation Needs: Not on file  Physical Activity: Not on file  Stress: Not on file  Social Connections: Not on file  Intimate Partner Violence: Not on file    Family History  Problem Relation Age of Onset   Bladder Cancer Mother    Heart attack Father    Breast cancer Maternal Grandmother 80   Kidney disease Neg Hx      Current Outpatient Medications:    alendronate (FOSAMAX) 70 MG tablet, Take 70 mg by mouth once a week. Take with a full glass of water on an empty stomach., Disp: , Rfl:    apixaban (ELIQUIS) 2.5 MG TABS tablet, Take 1 tablet (2.5 mg total) by mouth 2 (two) times daily., Disp: 60 tablet, Rfl: 3   atorvastatin (LIPITOR) 20 MG tablet, TAKE 1 TABLET BY MOUTH EVERY DAY, Disp: 90 tablet, Rfl: 0   clonazePAM (KLONOPIN) 1 MG tablet, Take 1 mg by mouth daily as needed for anxiety., Disp: , Rfl:    dexamethasone (DECADRON) 2 MG tablet, Take 1 tablet (2 mg total) by mouth daily., Disp: 15 tablet, Rfl: 1   dexamethasone (DECADRON) 4 MG tablet, TAKE 2 TABLETS (8 MG TOTAL) BY MOUTH DAILY. START THE DAY AFTER CARBOPLATIN CHEMOTHERAPY FOR 3 DAYS., Disp: 30 tablet, Rfl: 1   DULoxetine  (CYMBALTA) 60 MG capsule, Take 1 capsule (60 mg total) by mouth daily., Disp: 30 capsule, Rfl: 3   folic acid (FOLVITE) 1 MG tablet, Take 1 tablet (1 mg total) by mouth daily., Disp: 30 tablet, Rfl: 2   lidocaine-prilocaine (EMLA) cream, Apply to affected area once, Disp: 30 g, Rfl: 3   loratadine (CLARITIN) 10 MG tablet, Take 10 mg by mouth daily., Disp: , Rfl:    LORazepam (ATIVAN) 0.5 MG tablet, TAKE 1 TABLET (0.5 MG TOTAL) BY MOUTH EVERY 6 (SIX) HOURS AS NEEDED (NAUSEA OR VOMITING)., Disp: 30 tablet, Rfl: 0   ondansetron (ZOFRAN) 8 MG tablet, Take 1 tablet (8 mg total) by mouth 2 (two) times daily as needed for refractory nausea / vomiting. Start on day 3 after carboplatin chemo., Disp: 30 tablet, Rfl: 1   prochlorperazine (COMPAZINE) 10 MG tablet, TAKE 1 TABLET (10 MG TOTAL) BY MOUTH EVERY 6 HOURS AS NEEDED FOR NAUSEA AND VOMITING, Disp: 30 tablet, Rfl: 1  QUEtiapine (SEROQUEL) 100 MG tablet, Take 1 tablet (100 mg total) by mouth at bedtime., Disp: 90 tablet, Rfl: 1   SUMAtriptan (IMITREX) 100 MG tablet, May repeat in 2 hours if headache persists or recurs., Disp: 9 tablet, Rfl: 5   HYDROcodone-acetaminophen (NORCO) 5-325 MG tablet, Take 1-2 tablets by mouth every 4 (four) hours as needed for moderate pain. (Patient not taking: Reported on 02/08/2021), Disp: 45 tablet, Rfl: 0 No current facility-administered medications for this visit.  Facility-Administered Medications Ordered in Other Visits:    heparin lock flush 100 UNIT/ML injection, , , ,   Physical exam:  Vitals:   03/01/21 0834  BP: (!) 141/96  Pulse: (!) 108  Resp: 16  Temp: (!) 96.8 F (36 C)  TempSrc: Tympanic  SpO2: 96%  Weight: 143 lb 12.8 oz (65.2 kg)  Height: 5\' 3"  (1.6 m)   Physical Exam Constitutional:      Comments: Appears fatigued  Cardiovascular:     Rate and Rhythm: Regular rhythm. Tachycardia present.     Heart sounds: Normal heart sounds.  Pulmonary:     Effort: Pulmonary effort is normal.     Breath  sounds: Normal breath sounds.  Abdominal:     General: Bowel sounds are normal.     Palpations: Abdomen is soft.     Comments: Nontender nondistended  Musculoskeletal:     Cervical back: Normal range of motion.  Skin:    General: Skin is warm and dry.  Neurological:     Mental Status: She is alert and oriented to person, place, and time.     CMP Latest Ref Rng & Units 03/01/2021  Glucose 70 - 99 mg/dL 122(H)  BUN 8 - 23 mg/dL 27(H)  Creatinine 0.44 - 1.00 mg/dL 0.48  Sodium 135 - 145 mmol/L 130(L)  Potassium 3.5 - 5.1 mmol/L 3.8  Chloride 98 - 111 mmol/L 96(L)  CO2 22 - 32 mmol/L 26  Calcium 8.9 - 10.3 mg/dL 9.4  Total Protein 6.5 - 8.1 g/dL 7.0  Total Bilirubin 0.3 - 1.2 mg/dL 0.4  Alkaline Phos 38 - 126 U/L 69  AST 15 - 41 U/L 25  ALT 0 - 44 U/L 27   CBC Latest Ref Rng & Units 03/01/2021  WBC 4.0 - 10.5 K/uL 13.1(H)  Hemoglobin 12.0 - 15.0 g/dL 13.4  Hematocrit 36.0 - 46.0 % 40.5  Platelets 150 - 400 K/uL 333     Assessment and plan- Patient is a 76 y.o. 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  She is here for on treatment assessment prior to cycle 3 of CarboTaxol bevacizumab chemotherapy   urine protein is currently pending and her blood pressure in the clinic today was 148/96.  We will repeat her blood pressure and if it is less than 150/95 it would be okay for her to receive bevacizumab.  We will reduce carboplatin dose to AUC 4 and Taxol to 150 mg per metered square given her ongoing peripheral neuropathy.  Chemo-induced peripheral neuropathy: Continue duloxetine.  Reducing CarboTaxol dose as above.  Chemo/malignancy induced fatigue: Continue American ginseng.  Encourage physical activity.  She is also drinking Ensure about 3-4 times a day.  We will plan to get repeat CT chest abdomen pelvis with contrast after this cycle.  She will also be meeting GYN oncology following that to see if she would be a candidate for surgery.   However given her ongoing fatigue and a performance status of 2, it is  unclear if she would be a candidate for surgery.  Patient herself is hesitant to proceed with any surgery at this time as she is concerned she does not have any round-the-clock help at home to recover following surgery.  I will see her back in 3 weeks for cycle 4 of chemotherapy.  If plan is to proceed with surgery then I will omit Avastin with cycle 4   Visit Diagnosis 1. Encounter for antineoplastic chemotherapy   2. Encounter for monoclonal antibody treatment for malignancy      Dr. Randa Evens, MD, MPH [facility] at [facility] [phone] 03/01/2021 8:44 AM

## 2021-03-01 NOTE — Progress Notes (Signed)
Per MD proceed with avastin prior to urine protein resulting. Pt updated  Kathy Morton [employer]

## 2021-03-01 NOTE — Progress Notes (Signed)
Nutrition Follow-up:  Patient with stage IV high grade serouc carcinoma likely ovary origin.  Patient receiving neoadjuvant chemotherapy.    Met with patient during infusion.  Patient reports that she is eating.  Usually eats 2 eggs, 2 pieces of toast with butter for breakfast.  Lunch is peanut butter and jelly sandwich.  Drinks shakes in the evening.  Drinking 3-4 ensure max protein shakes (150 calories, 30 g protein/shake).  Denies nausea.  Says that he has some issues with constipation at times, takes miralax.      Medications: dexamethasone, folic acid,   Labs: Na 130, glucose 122, BUN 27, creatinine 0.48  Anthropometrics:   Weight 143 lb 12.8 oz today, decreased. Denies any fluid loss  152 lb 6.4 oz on 2/7 159 lb 1.6 oz on 1/17 (fluid removed day before) 179 lb 1 year ago  6% weight loss in the last 3 weeks, significant  NUTRITION DIAGNOSIS: Unintentional weight loss continues   INTERVENTION:  Recommend switching to ensure complete (350 calories/30 g protein/shake) or ensure plus (350 calories/16 g protein/shake) with significant weight loss in 3 weeks.  Discussed with patient.  Coupons and samples given to patient.  Says that she has a hard time opening bottles like the ensure complete.  Discussed options.   Reviewed ways to add calories and protein to diet.      MONITORING, EVALUATION, GOAL: weight trends, intake   NEXT VISIT: phone call in ~3 weeks.    Laurann Mcmorris B. Zenia Resides, [place], [place] Registered Dietitian [phone] (mobile)

## 2021-03-02 LAB — CA 125: Cancer Antigen (CA) 125: 126 U/mL — ABNORMAL HIGH (ref 0.0–38.1)

## 2021-03-04 ENCOUNTER — Other Ambulatory Visit: Payer: Self-pay

## 2021-03-04 ENCOUNTER — Ambulatory Visit
Admission: RE | Admit: 2021-03-04 | Discharge: 2021-03-04 | Disposition: A | Discharge status: Home / Self Care | Payer: Medicare PPO | Source: Ambulatory Visit | Attending: Oncology | Admitting: Oncology

## 2021-03-04 DIAGNOSIS — C569 Malignant neoplasm of unspecified ovary: Secondary | ICD-10-CM | POA: Insufficient documentation

## 2021-03-04 DIAGNOSIS — I2699 Other pulmonary embolism without acute cor pulmonale: Secondary | ICD-10-CM | POA: Insufficient documentation

## 2021-03-04 DIAGNOSIS — R188 Other ascites: Secondary | ICD-10-CM | POA: Diagnosis not present

## 2021-03-04 DIAGNOSIS — K8020 Calculus of gallbladder without cholecystitis without obstruction: Secondary | ICD-10-CM | POA: Insufficient documentation

## 2021-03-04 DIAGNOSIS — I700 Atherosclerosis of aorta: Secondary | ICD-10-CM | POA: Diagnosis not present

## 2021-03-04 MED ORDER — IOHEXOL 300 MG/ML  SOLN
100.0000 mL | Freq: Once | INTRAMUSCULAR | Status: AC | PRN
  Administered 2021-03-04: 100 mL via INTRAVENOUS

## 2021-03-05 ENCOUNTER — Other Ambulatory Visit: Payer: Self-pay

## 2021-03-05 ENCOUNTER — Telehealth: Payer: Self-pay | Admitting: Oncology

## 2021-03-05 ENCOUNTER — Observation Stay
Admission: EM | Admit: 2021-03-05 | Discharge: 2021-03-09 | Disposition: A | Discharge status: Home Health | Payer: Medicare PPO | Attending: Internal Medicine | Admitting: Internal Medicine

## 2021-03-05 ENCOUNTER — Encounter: Payer: Self-pay | Admitting: Emergency Medicine

## 2021-03-05 ENCOUNTER — Emergency Department: Payer: Medicare PPO

## 2021-03-05 DIAGNOSIS — Z79899 Other long term (current) drug therapy: Secondary | ICD-10-CM | POA: Diagnosis not present

## 2021-03-05 DIAGNOSIS — Z86718 Personal history of other venous thrombosis and embolism: Secondary | ICD-10-CM | POA: Insufficient documentation

## 2021-03-05 DIAGNOSIS — Z86711 Personal history of pulmonary embolism: Secondary | ICD-10-CM | POA: Insufficient documentation

## 2021-03-05 DIAGNOSIS — E871 Hypo-osmolality and hyponatremia: Secondary | ICD-10-CM | POA: Diagnosis not present

## 2021-03-05 DIAGNOSIS — Z7901 Long term (current) use of anticoagulants: Secondary | ICD-10-CM | POA: Diagnosis not present

## 2021-03-05 DIAGNOSIS — Z20822 Contact with and (suspected) exposure to covid-19: Secondary | ICD-10-CM | POA: Diagnosis not present

## 2021-03-05 DIAGNOSIS — F32A Depression, unspecified: Secondary | ICD-10-CM | POA: Diagnosis present

## 2021-03-05 DIAGNOSIS — F329 Major depressive disorder, single episode, unspecified: Secondary | ICD-10-CM

## 2021-03-05 DIAGNOSIS — Z8052 Family history of malignant neoplasm of bladder: Secondary | ICD-10-CM | POA: Diagnosis not present

## 2021-03-05 DIAGNOSIS — D529 Folate deficiency anemia, unspecified: Secondary | ICD-10-CM | POA: Diagnosis present

## 2021-03-05 DIAGNOSIS — I10 Essential (primary) hypertension: Secondary | ICD-10-CM

## 2021-03-05 DIAGNOSIS — I2699 Other pulmonary embolism without acute cor pulmonale: Principal | ICD-10-CM

## 2021-03-05 DIAGNOSIS — R531 Weakness: Secondary | ICD-10-CM | POA: Diagnosis not present

## 2021-03-05 DIAGNOSIS — R5381 Other malaise: Secondary | ICD-10-CM | POA: Diagnosis not present

## 2021-03-05 DIAGNOSIS — R2681 Unsteadiness on feet: Secondary | ICD-10-CM | POA: Diagnosis not present

## 2021-03-05 DIAGNOSIS — C569 Malignant neoplasm of unspecified ovary: Secondary | ICD-10-CM | POA: Diagnosis present

## 2021-03-05 DIAGNOSIS — R0602 Shortness of breath: Secondary | ICD-10-CM | POA: Diagnosis not present

## 2021-03-05 DIAGNOSIS — Z803 Family history of malignant neoplasm of breast: Secondary | ICD-10-CM | POA: Diagnosis not present

## 2021-03-05 LAB — CBC WITH DIFFERENTIAL/PLATELET
Abs Immature Granulocytes: 0.02 K/uL (ref 0.00–0.07)
Basophils Absolute: 0 K/uL (ref 0.0–0.1)
Basophils Relative: 0 %
Eosinophils Absolute: 0 K/uL (ref 0.0–0.5)
Eosinophils Relative: 0 %
HCT: 41.5 % (ref 36.0–46.0)
Hemoglobin: 14.5 g/dL (ref 12.0–15.0)
Immature Granulocytes: 0 %
Lymphocytes Relative: 18 %
Lymphs Abs: 1.7 K/uL (ref 0.7–4.0)
MCH: 32 pg (ref 26.0–34.0)
MCHC: 34.9 g/dL (ref 30.0–36.0)
MCV: 91.6 fL (ref 80.0–100.0)
Monocytes Absolute: 0.2 K/uL (ref 0.1–1.0)
Monocytes Relative: 2 %
Neutro Abs: 7.3 K/uL (ref 1.7–7.7)
Neutrophils Relative %: 80 %
Platelets: 254 K/uL (ref 150–400)
RBC: 4.53 MIL/uL (ref 3.87–5.11)
RDW: 19.4 % — ABNORMAL HIGH (ref 11.5–15.5)
WBC: 9.1 K/uL (ref 4.0–10.5)
nRBC: 0 % (ref 0.0–0.2)

## 2021-03-05 LAB — BASIC METABOLIC PANEL WITH GFR
Anion gap: 11 (ref 5–15)
BUN: 36 mg/dL — ABNORMAL HIGH (ref 8–23)
CO2: 19 mmol/L — ABNORMAL LOW (ref 22–32)
Calcium: 9.7 mg/dL (ref 8.9–10.3)
Chloride: 95 mmol/L — ABNORMAL LOW (ref 98–111)
Creatinine, Ser: 0.48 mg/dL (ref 0.44–1.00)
GFR, Estimated: >60 mL/min
Glucose, Bld: 106 mg/dL — ABNORMAL HIGH (ref 70–99)
Potassium: 3.7 mmol/L (ref 3.5–5.1)
Sodium: 125 mmol/L — ABNORMAL LOW (ref 135–145)

## 2021-03-05 LAB — TROPONIN I (HIGH SENSITIVITY)
Troponin I (High Sensitivity): 55 ng/L — ABNORMAL HIGH
Troponin I (High Sensitivity): 59 ng/L — ABNORMAL HIGH

## 2021-03-05 LAB — RESP PANEL BY RT-PCR (FLU A&B, COVID) ARPGX2
Influenza A by PCR: NEGATIVE
Influenza B by PCR: NEGATIVE
SARS Coronavirus 2 by RT PCR: NEGATIVE

## 2021-03-05 MED ORDER — ACETAMINOPHEN 325 MG PO TABS
650.0000 mg | ORAL_TABLET | Freq: Four times a day (QID) | ORAL | Status: DC | PRN
  Administered 2021-03-06 – 2021-03-09 (×6): 650 mg via ORAL
  Filled 2021-03-05 (×6): qty 2

## 2021-03-05 MED ORDER — ONDANSETRON HCL 4 MG PO TABS: 8.0000 mg | ORAL_TABLET | Freq: Two times a day (BID) | ORAL | Status: DC | PRN

## 2021-03-05 MED ORDER — SODIUM CHLORIDE 0.9 % IV BOLUS
500.0000 mL | Freq: Once | INTRAVENOUS | Status: AC
Start: 2021-03-05 — End: 2021-03-05
  Administered 2021-03-05: 500 mL via INTRAVENOUS

## 2021-03-05 MED ORDER — ACETAMINOPHEN 650 MG RE SUPP: 650.0000 mg | Freq: Four times a day (QID) | RECTAL | Status: DC | PRN

## 2021-03-05 MED ORDER — FOLIC ACID 1 MG PO TABS
1.0000 mg | ORAL_TABLET | Freq: Every day | ORAL | Status: DC
  Administered 2021-03-05 – 2021-03-09 (×5): 1 mg via ORAL
  Filled 2021-03-05 (×5): qty 1

## 2021-03-05 MED ORDER — SODIUM CHLORIDE 0.9 % IV SOLN
INTRAVENOUS | Status: DC
Start: 2021-03-05 — End: 2021-03-06

## 2021-03-05 MED ORDER — CLONAZEPAM 0.5 MG PO TABS
1.0000 mg | ORAL_TABLET | Freq: Two times a day (BID) | ORAL | Status: DC | PRN
  Administered 2021-03-06 – 2021-03-08 (×4): 1 mg via ORAL
  Filled 2021-03-05 (×4): qty 2

## 2021-03-05 MED ORDER — HEPARIN (PORCINE) 25000 UT/250ML-% IV SOLN
1100.0000 [IU]/h | INTRAVENOUS | Status: DC
  Administered 2021-03-06: 1100 [IU]/h via INTRAVENOUS
  Filled 2021-03-05: qty 250

## 2021-03-05 NOTE — Assessment & Plan Note (Signed)
Attribute to decreased po intake.  ?We will cont with MIVF and hydrate.  ? ?

## 2021-03-05 NOTE — ED Triage Notes (Signed)
Pt is coming from home via [facility]. Per EMS, the on-call oncologist called pt stating that CT scan showed a clot in pt's lung. Unknown which side clot is located. CT scan was done yesterday around 10:30 am.  ? ?Pt is a cancer pt with ovarian cancer. Pt currently takes 2 Eliquis pills. On-called oncologist told pt to take 4 more today which makes a total of 6 Eliquis pills pt has taken today.  ? ?Pt denies any chest pain but is SOB and respirations are labored.  ?

## 2021-03-05 NOTE — Assessment & Plan Note (Addendum)
Currently pt receiving chemo therapy and is being managed [facility]- Dr Janese Banks.  ?Continue duloxetine.  Reducing CarboTaxol dose as above. ?

## 2021-03-05 NOTE — Assessment & Plan Note (Signed)
Pt is not able to tell me how long she has been SOB . ?Supportive care and supplemental oxygen.  ? ?

## 2021-03-05 NOTE — Telephone Encounter (Signed)
Son called and reported being concerned that patient is weak.  ?CT results were reviewed. Recommend patient to go to ER for evaluation. ?

## 2021-03-05 NOTE — H&P (Signed)
History and Physical    Patient: Kathy Morton QHU:[id number] DOB: Mar 29, 1945 DOA: 03/05/2021 DOS: the patient was seen and examined on 03/05/2021 PCP: Leonides Sake, MD  Patient coming from: Home  Chief Complaint:  Chief Complaint  Patient presents with   Shortness of Breath    HPI: Kathy Morton is a 76 y.o. female with medical history significant of DVT, PE presenting for segmental pulmonary embolism in the left lower lobe on the CT angio ordered by her physician.  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  Patient does not report any chest pain or pressure or shortness of breath. Case was discussed with emergency room physician for admission.  We have reviewed labs and specialist note from his recent heme-onc visit with Dr. Janese Banks.  Patient's previous history of pulmonary embolism and VTE's put her at high risk and need for monitoring.Pt is alert but when asked about what happened she says she has a blood clot and , she does not know, when I asked about her ambulation status she said I don't know. Son was at bedside initially and says she is neurologically and physically ok at baseline right now she is nervous.    Review of Systems: Review of Systems  Unable to perform ROS: Age  Psychiatric/Behavioral:  The patient is nervous/anxious.   All other systems reviewed and are negative.  Past Medical History:  Diagnosis Date   Anemia, folate and B12 deficiency 01/01/2021   Anxiety    Depression    DVT of leg (deep venous thrombosis) (HCC)    Dysuria    Family history of bladder cancer    Family history of breast cancer    History of blood clots    Hypertension    Incontinence    Migraine    Recurrent UTI    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 [facility])    Past Surgical History:  Procedure Laterality Date   BREAST BIOPSY Left 01/06/13   benign finding of sclerosing adenosis   COLONOSCOPY WITH PROPOFOL N/A 11/29/2015   Procedure: COLONOSCOPY WITH PROPOFOL;   Surgeon: Jonathon Bellows, MD;  Location: [facility] ENDOSCOPY;  Service: Endoscopy;  Laterality: N/A;   IR IMAGING GUIDED PORT INSERTION  01/17/2021   IR PARACENTESIS  01/17/2021   KNEE SURGERY Left 2010   torn meniscus   TONSILECTOMY, ADENOIDECTOMY, BILATERAL MYRINGOTOMY AND TUBES     Social History:  reports that she has never smoked. She has never used smokeless tobacco. She reports that she does not drink alcohol and does not use drugs.  No Known Allergies  Family History  Problem Relation Age of Onset   Bladder Cancer Mother    Heart attack Father    Breast cancer Maternal Grandmother 80   Kidney disease Neg Hx     Prior to Admission medications   Medication Sig Start Date End Date Taking? Authorizing Provider  acetaminophen (TYLENOL) 500 MG tablet Take 500 mg by mouth every 6 (six) hours as needed for headache, fever or mild pain.   Yes [provider]  alendronate (FOSAMAX) 70 MG tablet Take 70 mg by mouth once a week. Take with a full glass of water on an empty stomach.   Yes [provider]  apixaban (ELIQUIS) 2.5 MG TABS tablet Take 1 tablet (2.5 mg total) by mouth 2 (two) times daily. 01/12/21  Yes Sindy Guadeloupe, MD  atorvastatin (LIPITOR) 20 MG tablet TAKE 1 TABLET BY MOUTH EVERY DAY 05/20/20  Yes Webb Silversmith  W, NP  clonazePAM (KLONOPIN) 1 MG tablet Take 1 mg by mouth daily as needed for anxiety.   Yes [provider]  dexamethasone (DECADRON) 4 MG tablet TAKE 2 TABLETS (8 MG TOTAL) BY MOUTH DAILY. START THE DAY AFTER CARBOPLATIN CHEMOTHERAPY FOR 3 DAYS. 02/21/21  Yes Sindy Guadeloupe, MD  DULoxetine (CYMBALTA) 60 MG capsule Take 1 capsule (60 mg total) by mouth daily. 02/28/21  Yes Borders, Kirt Boys, NP  folic acid (FOLVITE) 1 MG tablet Take 1 tablet (1 mg total) by mouth daily. 01/04/21  Yes Fritzi Mandes, MD  loratadine (CLARITIN) 10 MG tablet Take 10 mg by mouth daily.   Yes [provider]  LORazepam (ATIVAN) 0.5 MG tablet TAKE 1 TABLET (0.5 MG TOTAL)  BY MOUTH EVERY 6 (SIX) HOURS AS NEEDED (NAUSEA OR VOMITING). 02/21/21  Yes Sindy Guadeloupe, MD  QUEtiapine (SEROQUEL) 100 MG tablet Take 1 tablet (100 mg total) by mouth at bedtime. 02/11/20  Yes Karamalegos, Devonne Doughty, DO  dexamethasone (DECADRON) 2 MG tablet Take 1 tablet (2 mg total) by mouth daily. Patient not taking: Reported on 03/05/2021 02/14/21   Borders, Kirt Boys, NP  HYDROcodone-acetaminophen (NORCO) 5-325 MG tablet Take 1-2 tablets by mouth every 4 (four) hours as needed for moderate pain. Patient not taking: Reported on 02/08/2021 01/24/21   Borders, Kirt Boys, NP  lidocaine-prilocaine (EMLA) cream Apply to affected area once 01/13/21   Sindy Guadeloupe, MD  ondansetron (ZOFRAN) 8 MG tablet Take 1 tablet (8 mg total) by mouth 2 (two) times daily as needed for refractory nausea / vomiting. Start on day 3 after carboplatin chemo. Patient not taking: Reported on 03/05/2021 02/21/21   Sindy Guadeloupe, MD  prochlorperazine (COMPAZINE) 10 MG tablet TAKE 1 TABLET (10 MG TOTAL) BY MOUTH EVERY 6 HOURS AS NEEDED FOR NAUSEA AND VOMITING Patient not taking: Reported on 03/05/2021 02/21/21   Sindy Guadeloupe, MD  SUMAtriptan (IMITREX) 100 MG tablet May repeat in 2 hours if headache persists or recurs. 06/17/19   Verl Bangs, FNP    Physical Exam: Vitals:   03/05/21 1645 03/05/21 1700 03/05/21 1730 03/05/21 1900  BP: (!) 153/93 (!) 146/90 (!) 147/93 (!) 149/85  Pulse: 96 94 87 84  Resp: (!) 24 (!) 27 (!) 21 (!) 22  Temp: 97.9 F (36.6 C)     TempSrc: Oral     SpO2: 100% 100% 100% 100%  Physical Exam Vitals and nursing note reviewed.  Constitutional:      General: She is not in acute distress.    Appearance: Normal appearance. She is not ill-appearing, toxic-appearing or diaphoretic.  HENT:     Head: Normocephalic and atraumatic.     Right Ear: Hearing and external ear normal.     Left Ear: Hearing and external ear normal.     Nose: Nose normal. No nasal deformity.     Mouth/Throat:     Lips: Pink.      Mouth: Mucous membranes are moist.     Tongue: No lesions.     Pharynx: Oropharynx is clear.  Eyes:     Extraocular Movements: Extraocular movements intact.     Pupils: Pupils are equal, round, and reactive to light.  Neck:     Vascular: No carotid bruit.  Cardiovascular:     Rate and Rhythm: Normal rate and regular rhythm.     Pulses: Normal pulses.     Heart sounds: Normal heart sounds.  Pulmonary:     Effort: Pulmonary  effort is normal.     Breath sounds: Normal breath sounds.  Abdominal:     General: Bowel sounds are normal. There is no distension.     Palpations: Abdomen is soft. There is no mass.     Tenderness: There is no abdominal tenderness. There is no guarding.     Hernia: No hernia is present.  Musculoskeletal:     Right lower leg: No edema.     Left lower leg: No edema.  Skin:    General: Skin is warm.  Neurological:     General: No focal deficit present.     Mental Status: She is alert and oriented to person, place, and time.     Cranial Nerves: Cranial nerves 2-12 are intact.     Motor: Motor function is intact.  Psychiatric:        Attention and Perception: Attention normal.        Mood and Affect: Mood normal.        Speech: Speech normal.        Behavior: Behavior normal. Behavior is cooperative.        Cognition and Memory: Cognition normal.     Data Reviewed: >CTA Chest: IMPRESSION: 1. Study is positive for segmental sized pulmonary embolism in the left lower lobe (axial image 37 of series 2). 2. Resolution of large volume of malignant ascites noted on prior studies. There is a large amount of omental caking which persists on today's examination. 3. Resolution of previously noted pleural effusions with small amount of residual right-sided pleural thickening. 4. Cholelithiasis without evidence of acute cholecystitis at this time. 5. Aortic atherosclerosis. 6. Additional incidental findings, as above.  These results will be called to the  ordering clinician or representative by the Radiologist Assistant, and communication documented in the PACS or [facility] Dashboard. > BMP shows sodium 125 chloride 95, bicarb 19, glucose 106, BUN 36, creatinine of 0.48 with a GFR of 60. > Troponin of 55. > CBC shows white count of 9.1, hemoglobin of 14.5, platelets of 254. > Respiratory panel is negative for flu and COVID. > In the emergency room patient started on heparin per pharmacy protocol. > Echocardiogram January 2023: Left ventricular ejection fraction, by estimation, is 50 to 55%. The left ventricle has low normal function. The left ventricle has no regional wall motion abnormalities. There is mild left ventricular hypertrophy of the basal-septal segment. Left ventricular diastolic parameters are consistent with Grade I diastolic dysfunction (impaired relaxation).  Right ventricular systolic function is normal. The right ventricular size is normal.  The mitral valve is normal in structure. No evidence of mitral valve regurgitation. The aortic valve is tricuspid. Aortic valve regurgitation is not visualized. Aortic valve sclerosis/calcification is present, without any evidence of aortic stenosis.   Assessment and Plan: * Pulmonary embolism ([place]) Pt presenting with SOB that has been going on for a while.  Pt is dyspneic while speaking full sentences.  Pt changed to heparin drip to be started per pharmacy protocol .  SOB (shortness of breath) Pt is not able to tell me how long she has been SOB . Supportive care and supplemental oxygen.    Hyponatremia Attribute to decreased po intake.  We will cont with MIVF and hydrate.    Hypertension Blood pressure (!) 149/85, pulse 84, temperature 97.9 F (36.6 C), temperature source Oral, resp. rate (!) 22, SpO2 100 %. no home meds we will start pt on hydralazine 10 mg po q12 sch with parameters.  Malignant  neoplasm of ovary [facility]) Currently pt receiving chemo therapy and is being  managed [facility]- Dr Janese Banks.  Continue duloxetine.  Reducing CarboTaxol dose as above.     Advance Care Planning:   Code Status: Full Code   Consults:  IW.LNLG- Vascular .  Family Communication:  Edmonds,Scott (Son)  [phone] (Mobile)  Severity of Illness: The appropriate patient status for this patient is OBSERVATION. Observation status is judged to be reasonable and necessary in order to provide the required intensity of service to ensure the patient's safety. The patient's presenting symptoms, physical exam findings, and initial radiographic and laboratory data in the context of their medical condition is felt to place them at decreased risk for further clinical deterioration. Furthermore, it is anticipated that the patient will be medically stable for discharge from the hospital within 2 midnights of admission.   Author: Para Skeans, MD 03/05/2021 10:11 PM  For on call review [email or web address].

## 2021-03-05 NOTE — Assessment & Plan Note (Signed)
Blood pressure (!) 149/85, pulse 84, temperature 97.9 ?F (36.6 ?C), temperature source Oral, resp. rate (!) 22, SpO2 100 %. ?no home meds we will start pt on hydralazine 10 mg po q12 sch with parameters. ?

## 2021-03-05 NOTE — Consult Note (Signed)
ANTICOAGULATION CONSULT NOTE - Initial Consult ? ?Pharmacy Consult for heparin ?Indication: pulmonary embolus ? ?No Known Allergies ? ?Patient Measurements: ?  ?Heparin Dosing Weight: 65.2kg ? ?Vital Signs: ?Temp: 97.9 ?F (36.6 ?C) (03/04 1645) ?Temp Source: Oral (03/04 1645) ?BP: 149/85 (03/04 1900) ?Pulse Rate: 84 (03/04 1900) ? ?Labs: ?Recent Labs  ?  03/05/21 ?1657  ?HGB 14.5  ?HCT 41.5  ?PLT 254  ?CREATININE 0.48  ?TROPONINIHS 55*  ? ? ?Estimated Creatinine Clearance: 54.3 mL/min (by C-G formula based on SCr of 0.48 mg/dL). ? ? ?Medical History: ?Past Medical History:  ?Diagnosis Date  ? Anxiety   ? Depression   ? DVT of leg (deep venous thrombosis) ([place])   ? Dysuria   ? Family history of bladder cancer   ? Family history of breast cancer   ? History of blood clots   ? Hypertension   ? Incontinence   ? Migraine   ? Recurrent UTI   ? 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 ? ?Medications:  ?PTA: Eliquis 2.'5mg'$  BID ?Inpatient: Heparin Drip (3/4 >>>) ?Allergies: NKDA ? ?Assessment: ?76 year old female with history of DVT on low dose eliquis presents to ED following CT result showing segmental sized pulmonary embolism in the left lower lobe. Eliquis dosing was increased from 2.5 bid to 10 bid per oncology and patient presented to ED for evaluation same day. Pharmacy consulted for heparin management in the setting of pulmonary embolism.  ? ?3/4 Patient ingested '15mg'$  eliqius today prior to admission ~1630  ? ?Date Time aPTT/HL Rate/Comment ?     ? ?Baseline Labs: ?aPTT - ordered ?HL - ordered ?Hgb - 14.5 ?Plts - 254 ? ?Goal of Therapy:  ?Heparin level 0.3-0.7 units/ml ?Monitor platelets by anticoagulation protocol: Yes ?  ?Plan:  ?We will wait at least 12 hours since last dose of Eliquis, then start heparin infusion at 1100 units/hr tomorrow (3/5) at 0500. ?No heparin bolus given recent Eliquis load of '15mg'$   ?Check aPTT/Anti-Xa level in 8 hours and daily once consecutively therapeutic.  ?Titrate by aPTT's until  lab correlation is noted, then titrate by anti-xa alone. ?Continue to monitor H&H and platelets daily while on heparin gtt. ? ?Darrick Penna, PharmD, MS PGPM ?Clinical Pharmacist ?03/05/2021 ?7:49 PM ? ? ? ?

## 2021-03-05 NOTE — Telephone Encounter (Signed)
Critical result of CT was called by radiology.  ?Positive for segmental sized pulmonary embolism in the left lower lobe.  ?I called patient and updated her the result.  ?She feel her SOB is not more than her usual level. She is on Eliquis 2.'5mg'$  BID for prophylaxis.  ?She does not want to go to ER for further evaluation.  ?I recommend her to increase Eliquis '10mg'$  every 12 hours. She has appointment with Dr.Rao next week.  ?Advise patient to go to ER if her short of breath gets worse or if she experience bleeding events.  ?Cc Dr.Rao to follow up on 03/07/21 for further Rx management.  ? ?

## 2021-03-05 NOTE — Progress Notes (Signed)
Peak Flow completed with patient. ?Predicted Peak Flow--> 402 ?Best out of Three was---> 210 (roughly 50% of predicted) ? ?Patient gave her best effort. ?

## 2021-03-05 NOTE — ED Provider Notes (Signed)
? ?[facility] ?Provider Note ? ? ? None  ?  (approximate) ? ? ?History  ? ?Shortness of Breath ? ? ?HPI ? ?Kathy Morton is a 76 y.o. female  who, per oncology note from earlier today had a CT scan that was positive for segmental sized PE and had been on eliquis 2.5 mg BID for prophylaxis, who presents to the emergency department today because of the CT finding and at the advice of the oncologist.  Patient states she does feel some shortness of breath.  She denies any associated chest pain.  States she had a blood clot roughly 45 years ago. ? ? ?Physical Exam  ? ?Triage Vital Signs: ?ED Triage Vitals  ?Enc Vitals Group  ?   BP 03/05/21 1645 (!) 153/93  ?   Pulse Rate 03/05/21 1645 96  ?   Resp 03/05/21 1645 (!) 24  ?   Temp 03/05/21 1645 97.9 ?F (36.6 ?C)  ?   Temp Source 03/05/21 1645 Oral  ?   SpO2 03/05/21 1645 100 %  ?   Weight --   ?   Height --   ?   Head Circumference --   ?   Peak Flow --   ?   Pain Score 03/05/21 1647 2  ? ?Most recent vital signs: ?Vitals:  ? 03/05/21 1645  ?BP: (!) 153/93  ?Pulse: 96  ?Resp: (!) 24  ?Temp: 97.9 ?F (36.6 ?C)  ?SpO2: 100%  ? ?General: Awake, no distress.  ?CV:  Good peripheral perfusion.  ?Resp:  Slightly increased rate and effort. Clear to auscultation. ?Abd:  No distention.  ? ? ?ED Results / Procedures / Treatments  ? ?Labs ?(all labs ordered are listed, but only abnormal results are displayed) ?Labs Reviewed  ?BASIC METABOLIC PANEL - Abnormal; Notable for the following components:  ?    Result Value  ? Sodium 125 (*)   ? Chloride 95 (*)   ? CO2 19 (*)   ? Glucose, Bld 106 (*)   ? BUN 36 (*)   ? All other components within normal limits  ?CBC WITH DIFFERENTIAL/PLATELET - Abnormal; Notable for the following components:  ? RDW 19.4 (*)   ? All other components within normal limits  ?TROPONIN I (HIGH SENSITIVITY) - Abnormal; Notable for the following components:  ? Troponin I (High Sensitivity) 55 (*)   ? All other components within normal limits   ?TROPONIN I (HIGH SENSITIVITY) - Abnormal; Notable for the following components:  ? Troponin I (High Sensitivity) 59 (*)   ? All other components within normal limits  ?RESP PANEL BY RT-PCR (FLU A&B, COVID) [id number]  ?HEPARIN LEVEL (UNFRACTIONATED)  ?APTT  ?BRAIN NATRIURETIC PEPTIDE  ? ? ? ?EKG ? ?None ? ? ?RADIOLOGY ?CXR ?I independently interpreted and visualized the CXR. My interpretation: No pneumonia. No pneumothorax.  ?Radiology interpretation:  ?IMPRESSION:  ?No acute cardiopulmonary abnormality. LEFT lower lobe pulmonary  ?embolism is not visualized radiographically.  ? ? ?PROCEDURES: ? ?Critical Care performed: No ? ?Procedures ? ? ?MEDICATIONS ORDERED IN ED: ?Medications - No data to display ? ? ?IMPRESSION / MDM / ASSESSMENT AND PLAN / ED COURSE  ?I reviewed the triage vital signs and the nursing notes. ?             ?               ? ?Differential diagnosis includes, but is not limited to, PE secondary to underlying clotting disorder vs neoplastic  etiology. ? ?Patient presented to the emergency department today at the request of her oncologist because of concern for PE found on CT scan performed yesterday.  Patient does state that she is short of breath.  Mild troponin elevation today although I did not see that they commented on any right heart strain yesterday.  I discussed with Dr. Lorenso Courier with vascular surgery.  She did recommending starting heparin and admission to the hospital service.  Discussed with Dr. Posey Pronto the hospitalist on-call who will plan on admission. ? ?FINAL CLINICAL IMPRESSION(S) / ED DIAGNOSES  ? ?Final diagnoses:  ?Pulmonary embolism, other, unspecified chronicity, unspecified whether acute cor pulmonale present ([place])  ? ? ? ? ? ? ?Note:  This document was prepared using Dragon voice recognition software and may include unintentional dictation errors. ? ?  ?Nance Pear, MD ?03/05/21 2308 ? ?

## 2021-03-05 NOTE — Assessment & Plan Note (Signed)
Pt presenting with SOB that has been going on for a while.  ?Pt is dyspneic while speaking full sentences.  ?Pt changed to heparin drip to be started per pharmacy protocol . ?

## 2021-03-06 ENCOUNTER — Encounter: Payer: Self-pay | Admitting: Internal Medicine

## 2021-03-06 DIAGNOSIS — I2699 Other pulmonary embolism without acute cor pulmonale: Secondary | ICD-10-CM | POA: Diagnosis not present

## 2021-03-06 DIAGNOSIS — E871 Hypo-osmolality and hyponatremia: Secondary | ICD-10-CM | POA: Diagnosis not present

## 2021-03-06 DIAGNOSIS — C561 Malignant neoplasm of right ovary: Secondary | ICD-10-CM | POA: Diagnosis not present

## 2021-03-06 LAB — TROPONIN I (HIGH SENSITIVITY)
Troponin I (High Sensitivity): 62 ng/L — ABNORMAL HIGH (ref <18)
Troponin I (High Sensitivity): 60 ng/L — ABNORMAL HIGH (ref <18)

## 2021-03-06 LAB — HEPARIN LEVEL (UNFRACTIONATED): Heparin Unfractionated: >1.1 IU/mL — ABNORMAL HIGH (ref 0.30–0.70)

## 2021-03-06 LAB — BRAIN NATRIURETIC PEPTIDE: B Natriuretic Peptide: 618.6 pg/mL — ABNORMAL HIGH (ref 0.0–100.0)

## 2021-03-06 LAB — APTT
aPTT: 34 seconds (ref 24–36)
aPTT: 143 seconds — ABNORMAL HIGH (ref 24–36)

## 2021-03-06 MED ORDER — HEPARIN (PORCINE) 25000 UT/250ML-% IV SOLN: 900.0000 [IU]/h | INTRAVENOUS | Status: DC

## 2021-03-06 NOTE — ED Notes (Addendum)
Pt assisted to restroom by Sam, RN and Lynn Ito, RN while [place] [facility] EDT and this Probation officer provided a hospital bed for pt per request. Pt returned to rm and was assisted onto hospital bed by Sam RN and [employer]. Pt covered with warm blankets and placed back on monitor. No other needs/requests voiced at this time. ?

## 2021-03-06 NOTE — ED Notes (Signed)
Pt care taken, pt resting, call bell at bedside. ?

## 2021-03-06 NOTE — ED Notes (Signed)
Assisted pt to the bathroom, standby assist. Given a warm blanket upon return to room. Pt states she would like to keep all her 12 blankets. ?

## 2021-03-06 NOTE — Consult Note (Signed)
ANTICOAGULATION CONSULT NOTE - Initial Consult ? ?Pharmacy Consult for heparin ?Indication: pulmonary embolus ? ?No Known Allergies ? ?Patient Measurements: ?  ?Heparin Dosing Weight: 65.2kg ? ?Vital Signs: ?Temp: 98.3 ?F (36.8 ?C) (03/05 5701) ?Temp Source: Oral (03/05 7793) ?BP: 154/88 (03/05 1200) ?Pulse Rate: 91 (03/05 1200) ? ?Labs: ?Recent Labs  ?  03/05/21 ?1657 03/05/21 ?1931 03/05/21 ?2313 03/06/21 ?9030 03/06/21 ?0923 03/06/21 ?1300  ?HGB 14.5  --   --   --   --   --   ?HCT 41.5  --   --   --   --   --   ?PLT 254  --   --   --   --   --   ?APTT  --   --   --  34  --  143*  ?HEPARINUNFRC  --   --   --  >1.10*  --   --   ?CREATININE 0.48  --   --   --   --   --   ?TROPONINIHS 55* 59* 62*  --  60*  --   ? ? ? ?Estimated Creatinine Clearance: 54.3 mL/min (by C-G formula based on SCr of 0.48 mg/dL). ? ? ?Medical History: ?Past Medical History:  ?Diagnosis Date  ? Anemia, folate and B12 deficiency 01/01/2021  ? Anxiety   ? Depression   ? DVT of leg (deep venous thrombosis) ([place])   ? Dysuria   ? Family history of bladder cancer   ? Family history of breast cancer   ? History of blood clots   ? Hypertension   ? Incontinence   ? Migraine   ? Recurrent UTI   ? 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 ? ?Medications:  ?PTA: Eliquis 2.'5mg'$  BID ?Inpatient: Heparin Drip (3/4 >>>) ?Allergies: NKDA ? ?Assessment: ?76 year old female with history of DVT on low dose eliquis presents to ED following CT result showing segmental sized pulmonary embolism in the left lower lobe. Eliquis dosing was increased from 2.5 bid to 10 bid per oncology and patient presented to ED for evaluation same day. Pharmacy consulted for heparin management in the setting of pulmonary embolism.  ? ?3/4 Patient ingested '15mg'$  eliqius today prior to admission ~1630  ? ?Date Time aPTT/HL Rate/Comment ?3/5 1343 143  1100 /     ? ?Baseline Labs: ?aPTT - 34 ?HL -  >1.10 ?Hgb - 14.5 ?Plts - 254 ? ?Goal of Therapy:  ?Heparin level 0.3-0.7 units/ml ?Monitor  platelets by anticoagulation protocol: Yes ?  ?Plan: aPTT supratherapeutic.  ?Hold heparin x 1 hour ?Restart at 900 units hour. (decreased gtt by 3 units/kg).  ?aPTT in 8 hours ?Daily CBC, HL. Will dose by aPTT until HL correlates. ? ? ?Wynelle Cleveland, PharmD ?Pharmacy Resident  ?03/06/2021 ?2:49 PM ? ? ? ?

## 2021-03-06 NOTE — Progress Notes (Addendum)
Progress Note    Kathy Morton  KCL:[id number] DOB: 01-19-1945  DOA: 03/05/2021 PCP: Leonides Sake, MD      Brief Narrative:    Medical records reviewed and are as summarized below:  Kathy Morton is a 76 y.o. 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  She complained of shortness of breath.     Principal Problem:   Pulmonary embolism (HCC) Active Problems:   SOB (shortness of breath)   Hypertension   Hyponatremia   Malignant neoplasm of ovary (HCC)   Depression    Acute pulmonary embolism: She is tolerating room air.  Continue IV heparin drip and monitor heparin level per protocol.  Plan to transition to full dose Eliquis later today.  Acute on chronic hyponatremia: Discontinue IV fluids.  Monitor BMP.  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  Follow-up with oncologist, Dr. Janese Banks.  Other comorbidities include peripheral neuropathy, depression,    Diet Order             Diet Heart Room service appropriate? Yes; Fluid consistency: Thin  Diet effective now                         Consultants: None  Procedures: None    Medications:    folic acid  1 mg Oral Daily   Continuous Infusions:  heparin 1,100 Units/hr (03/06/21 0516)     Anti-infectives (From admission, onward)    None              Family Communication/Anticipated D/C date and plan/Code Status   DVT prophylaxis: SCDs Start: 03/05/21 2009     Code Status: Full Code  Family Communication: Plan discussed with the son, Nicki Reaper, at the bedside Disposition Plan: Plan  to discharge home tomorrow   Status is: Observation The patient will require care spanning > 2 midnights and should be moved to inpatient because: IV heparin             Subjective:   Interval events noted.  She complains of shortness of breath and pain around Port-A-Cath site on the right upper chest.  Objective:    Vitals:   03/06/21 0500 03/06/21 0600 03/06/21 0834 03/06/21 0900  BP: (!) 141/92 (!) 141/86  (!) 143/77  Pulse: 92 81  91  Resp: 20 16  (!) 28  Temp:   98.3 F (36.8 C)   TempSrc:   Oral   SpO2: 98% 95%  100%   No data found.   Intake/Output Summary (Last 24 hours) at 03/06/2021 1207 Last data filed at 03/05/2021 1936 Gross per 24 hour  Intake 500 ml  Output --  Net 500 ml   There were no vitals filed for this visit.  Exam:  GEN: NAD SKIN: No rash.  No erythema, tenderness or swelling around Port-A-Cath in the right upper chest EYES: EOMI ENT: MMM CV: RRR PULM: CTA B ABD: soft, obese, NT, +BS CNS: AAO x 3, non focal EXT: No edema or tenderness        Data Reviewed:   I have personally reviewed following labs and imaging  studies:  Labs: Labs show the following:   Basic Metabolic Panel: Recent Labs  Lab 03/01/21 0746 03/05/21 1657  NA 130* 125*  K 3.8 3.7  CL 96* 95*  CO2 26 19*  GLUCOSE 122* 106*  BUN 27* 36*  CREATININE 0.48 0.48  CALCIUM 9.4 9.7   GFR Estimated Creatinine Clearance: 54.3 mL/min (by C-G formula based on SCr of 0.48 mg/dL). Liver Function Tests: Recent Labs  Lab 03/01/21 0746  AST 25  ALT 27  ALKPHOS 69  BILITOT 0.4  PROT 7.0  ALBUMIN 3.9   No results for input(s): LIPASE, AMYLASE in the last 168 hours. No results for input(s): AMMONIA in the last 168 hours. Coagulation profile No results for input(s): INR, PROTIME in the last 168 hours.  CBC: Recent Labs  Lab 03/01/21 0746 03/05/21 1657  WBC 13.1* 9.1  NEUTROABS 10.7* 7.3  HGB 13.4 14.5  HCT 40.5 41.5  MCV 94.2 91.6  PLT 333 254    Cardiac Enzymes: No results for input(s): CKTOTAL, CKMB, CKMBINDEX, TROPONINI in the last 168 hours. BNP (last 3 results) No results for input(s): PROBNP in the last 8760 hours. CBG: No results for input(s): GLUCAP in the last 168 hours. D-Dimer: No results for input(s): DDIMER in the last 72 hours. Hgb A1c: No results for input(s): HGBA1C in the last 72 hours. Lipid Profile: No results for input(s): CHOL, HDL, LDLCALC, TRIG, CHOLHDL, LDLDIRECT in the last 72 hours. Thyroid function studies: No results for input(s): TSH, T4TOTAL, T3FREE, THYROIDAB in the last 72 hours.  Invalid input(s): FREET3 Anemia work up: No results for input(s): VITAMINB12, FOLATE, FERRITIN, TIBC, IRON, RETICCTPCT in the last 72 hours. Sepsis Labs: Recent Labs  Lab 03/01/21 0746 03/05/21 1657  WBC 13.1* 9.1    Microbiology Recent Results (from the past 240 hour(s))  Resp Panel by RT-PCR (Flu A&B, Covid) Nasopharyngeal Swab     Status: None   Collection Time: 03/05/21  4:57 PM   Specimen: Nasopharyngeal Swab; Nasopharyngeal(NP) swabs in vial transport medium  Result Value Ref Range Status   SARS Coronavirus 2 by RT PCR NEGATIVE NEGATIVE Final    Comment: (NOTE) SARS-CoV-2 target nucleic acids are NOT DETECTED.  The SARS-CoV-2 RNA is generally detectable in upper respiratory specimens during the acute phase of infection. The lowest concentration of SARS-CoV-2 viral copies this assay can detect is 138 copies/mL. A negative result does not preclude SARS-Cov-2 infection and should not be used as the sole basis for treatment or other patient management decisions. A negative result may occur with  improper specimen collection/handling, submission of specimen other than nasopharyngeal swab, presence of viral mutation(s) within the areas targeted by this assay, and inadequate number of viral copies(<138 copies/mL). A negative result must be combined with clinical observations, patient history, and  epidemiological information. The expected result is Negative.  Fact Sheet for Patients:  EntrepreneurPulse.com.au  Fact Sheet for Healthcare Providers:  IncredibleEmployment.be  This test is no t yet approved or cleared by the [place] FDA and  has been authorized for detection and/or diagnosis of SARS-CoV-2 by FDA under an Emergency Use Authorization (EUA). This EUA will remain  in effect (meaning this test can be used) for the duration of the COVID-19 declaration under Section 564(b)(1) of the Act, 21 U.S.C.section 360bbb-3(b)(1), unless the authorization is terminated  or revoked sooner.       Influenza A by PCR NEGATIVE NEGATIVE Final   Influenza B by PCR NEGATIVE NEGATIVE Final    Comment: (NOTE)  The Xpert Xpress SARS-CoV-2/FLU/RSV plus assay is intended as an aid in the diagnosis of influenza from Nasopharyngeal swab specimens and should not be used as a sole basis for treatment. Nasal washings and aspirates are unacceptable for Xpert Xpress SARS-CoV-2/FLU/RSV testing.  Fact Sheet for Patients: EntrepreneurPulse.com.au  Fact Sheet for Healthcare Providers: IncredibleEmployment.be  This test is not yet approved or cleared by the [place] FDA and has been authorized for detection and/or diagnosis of SARS-CoV-2 by FDA under an Emergency Use Authorization (EUA). This EUA will remain in effect (meaning this test can be used) for the duration of the COVID-19 declaration under Section 564(b)(1) of the Act, 21 U.S.C. section 360bbb-3(b)(1), unless the authorization is terminated or revoked.  Performed at [facility], [address], [place], [place] [address]     Procedures and diagnostic studies:  DG Chest [facility] 1 View  Result Date: 03/05/2021 CLINICAL DATA:  sob EXAM: PORTABLE CHEST 1 VIEW COMPARISON:  January 01, 2021 FINDINGS: The cardiomediastinal silhouette is unchanged in  contour.RIGHT chest port with tip terminating over the SVC. No pleural effusion. No pneumothorax. No acute pleuroparenchymal abnormality. Visualized abdomen is unremarkable. Multilevel degenerative changes of the thoracic spine. IMPRESSION: No acute cardiopulmonary abnormality. LEFT lower lobe pulmonary embolism is not visualized radiographically. Electronically Signed   By: Valentino Saxon M.D.   On: 03/05/2021 17:29               LOS: 0 days   Aldena Worm  [facility]   Pager on [email or web address]. If 7PM-7AM, please contact night-coverage at [email or web address]     03/06/2021, 12:07 PM

## 2021-03-06 NOTE — ED Notes (Signed)
Pt's son frequently comes out of room and stands in the hallway for different requests such as move pt bed, assist pt in turning, soda, water, an extra chair, food, Tylenol, bathroom assist, etc. Son and pt requests met every time.  ?

## 2021-03-06 NOTE — ED Notes (Signed)
Assisted pt to the restroom. Stand by assist.  ?

## 2021-03-06 NOTE — ED Notes (Addendum)
Pt assisted to restroom x1 person assist. Pt assisted in returning back into bed-pt requesting warm blankets, warm blankets placed on pt first and then several other blankets on top of the warm ones per request. Pt then repositioned up in bed to eat lunch. All utensils and food containers opened per request. Pt stating she can not eat salads- this writer disposed of salad, and iced tea per request. Pt requesting coke with ice and given. Son and daughter in law continue to be at bedside. No other requests/concerns voiced at this time. ?

## 2021-03-07 DIAGNOSIS — E871 Hypo-osmolality and hyponatremia: Secondary | ICD-10-CM | POA: Diagnosis not present

## 2021-03-07 DIAGNOSIS — C561 Malignant neoplasm of right ovary: Secondary | ICD-10-CM | POA: Diagnosis not present

## 2021-03-07 DIAGNOSIS — I2699 Other pulmonary embolism without acute cor pulmonale: Secondary | ICD-10-CM | POA: Diagnosis not present

## 2021-03-07 LAB — APTT: aPTT: 155 seconds — ABNORMAL HIGH (ref 24–36)

## 2021-03-07 LAB — BASIC METABOLIC PANEL
Anion gap: 9 (ref 5–15)
BUN: 17 mg/dL (ref 8–23)
CO2: 21 mmol/L — ABNORMAL LOW (ref 22–32)
Calcium: 8.7 mg/dL — ABNORMAL LOW (ref 8.9–10.3)
Chloride: 98 mmol/L (ref 98–111)
Creatinine, Ser: 0.39 mg/dL — ABNORMAL LOW (ref 0.44–1.00)
GFR, Estimated: >60 mL/min (ref >60)
Glucose, Bld: 121 mg/dL — ABNORMAL HIGH (ref 70–99)
Potassium: 3.5 mmol/L (ref 3.5–5.1)
Sodium: 128 mmol/L — ABNORMAL LOW (ref 135–145)

## 2021-03-07 LAB — CBC WITH DIFFERENTIAL/PLATELET
Abs Immature Granulocytes: 0.06 10*3/uL (ref 0.00–0.07)
Basophils Absolute: 0.1 10*3/uL (ref 0.0–0.1)
Basophils Relative: 1 %
Eosinophils Absolute: 0.2 10*3/uL (ref 0.0–0.5)
Eosinophils Relative: 3 %
HCT: 40.5 % (ref 36.0–46.0)
Hemoglobin: 13.8 g/dL (ref 12.0–15.0)
Immature Granulocytes: 1 %
Lymphocytes Relative: 27 %
Lymphs Abs: 2.3 10*3/uL (ref 0.7–4.0)
MCH: 31.6 pg (ref 26.0–34.0)
MCHC: 34.1 g/dL (ref 30.0–36.0)
MCV: 92.7 fL (ref 80.0–100.0)
Monocytes Absolute: 0.1 10*3/uL (ref 0.1–1.0)
Monocytes Relative: 2 %
Neutro Abs: 5.7 10*3/uL (ref 1.7–7.7)
Neutrophils Relative %: 66 %
Platelets: 212 10*3/uL (ref 150–400)
RBC: 4.37 MIL/uL (ref 3.87–5.11)
RDW: 18.4 % — ABNORMAL HIGH (ref 11.5–15.5)
WBC: 8.4 10*3/uL (ref 4.0–10.5)
nRBC: 0 % (ref 0.0–0.2)

## 2021-03-07 LAB — HEPARIN LEVEL (UNFRACTIONATED): Heparin Unfractionated: 0.82 IU/mL — ABNORMAL HIGH (ref 0.30–0.70)

## 2021-03-07 MED ORDER — CHLORHEXIDINE GLUCONATE CLOTH 2 % EX PADS
6.0000 | MEDICATED_PAD | Freq: Every day | CUTANEOUS | Status: DC
  Administered 2021-03-07 – 2021-03-09 (×3): 6 via TOPICAL

## 2021-03-07 MED ORDER — ENSURE ENLIVE PO LIQD
237.0000 mL | Freq: Two times a day (BID) | ORAL | Status: DC
  Administered 2021-03-07 – 2021-03-08 (×2): 237 mL via ORAL

## 2021-03-07 MED ORDER — ADULT MULTIVITAMIN W/MINERALS CH
1.0000 | ORAL_TABLET | Freq: Every day | ORAL | Status: DC
  Administered 2021-03-07 – 2021-03-09 (×3): 1 via ORAL
  Filled 2021-03-07 (×3): qty 1

## 2021-03-07 MED ORDER — APIXABAN 5 MG PO TABS: 5.0000 mg | ORAL_TABLET | Freq: Two times a day (BID) | ORAL | Status: DC

## 2021-03-07 MED ORDER — HEPARIN (PORCINE) 25000 UT/250ML-% IV SOLN: 700.0000 [IU]/h | INTRAVENOUS | Status: DC

## 2021-03-07 MED ORDER — APIXABAN 5 MG PO TABS
10.0000 mg | ORAL_TABLET | Freq: Two times a day (BID) | ORAL | Status: DC
  Administered 2021-03-07 – 2021-03-09 (×5): 10 mg via ORAL
  Filled 2021-03-07 (×5): qty 2

## 2021-03-07 NOTE — Consult Note (Signed)
ANTICOAGULATION CONSULT NOTE - Initial Consult ? ?Pharmacy Consult for heparin ?Indication: pulmonary embolus ? ?No Known Allergies ? ?Patient Measurements: ?Height: '5\' 3"'$  (160 cm) ?Weight: 64.4 kg (142 lb) ?IBW/kg (Calculated) : 52.4 ?Heparin Dosing Weight: 65.2kg ? ?Vital Signs: ?Temp: 97.7 ?F (36.5 ?C) (03/05 2307) ?Temp Source: Oral (03/05 2307) ?BP: 170/95 (03/05 2307) ?Pulse Rate: 106 (03/05 2307) ? ?Labs: ?Recent Labs  ?  03/05/21 ?1657 03/05/21 ?1931 03/05/21 ?2313 03/06/21 ?7048 03/06/21 ?8891 03/06/21 ?1300 03/07/21 ?0013  ?HGB 14.5  --   --   --   --   --   --   ?HCT 41.5  --   --   --   --   --   --   ?PLT 254  --   --   --   --   --   --   ?APTT  --   --   --  34  --  143* 155*  ?HEPARINUNFRC  --   --   --  >1.10*  --   --  0.82*  ?CREATININE 0.48  --   --   --   --   --   --   ?TROPONINIHS 55* 59* 62*  --  60*  --   --   ? ? ? ?Estimated Creatinine Clearance: 54 mL/min (by C-G formula based on SCr of 0.48 mg/dL). ? ? ?Medical History: ?Past Medical History:  ?Diagnosis Date  ? Anemia, folate and B12 deficiency 01/01/2021  ? Anxiety   ? Depression   ? DVT of leg (deep venous thrombosis) ([place])   ? Dysuria   ? Family history of bladder cancer   ? Family history of breast cancer   ? History of blood clots   ? Hypertension   ? Incontinence   ? Migraine   ? Recurrent UTI   ? 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 ? ?Medications:  ?PTA: Eliquis 2.'5mg'$  BID ?Inpatient: Heparin Drip (3/4 >>>) ?Allergies: NKDA ? ?Assessment: ?76 year old female with history of DVT on low dose eliquis presents to ED following CT result showing segmental sized pulmonary embolism in the left lower lobe. Eliquis dosing was increased from 2.5 bid to 10 bid per oncology and patient presented to ED for evaluation same day. Pharmacy consulted for heparin management in the setting of pulmonary embolism.  ? ?3/4 Patient ingested '15mg'$  eliqius today prior to admission ~1630  ? ?Date Time aPTT/HL Rate/Comment ?3/5 1343 143  1100 /      ?3/6       0013    155                  900 units/hr  ?Baseline Labs: ?aPTT - 34 ?HL -  >1.10 ?Hgb - 14.5 ?Plts - 254 ? ?Goal of Therapy:  ?aPTT:  66 - 102  ?Heparin level 0.3-0.7 units/ml ?Monitor platelets by anticoagulation protocol: Yes ?  ?Plan:  ?3/6: aPTT @ 0013 = 155, SUPRAtherapeutic  ?Will hold heparin drip for 1 hr and restart @ 700 units/hr. ?Will recheck aPTT 8 hrs after restart.  ? ?Landyn Lorincz D, PharmD ?03/07/2021 ?1:31 AM ? ? ? ?

## 2021-03-07 NOTE — Progress Notes (Signed)
Initial Nutrition Assessment ? ?DOCUMENTATION CODES:  ? ?Not applicable ? ?INTERVENTION:  ? ?-Ensure Enlive po BID, each supplement provides 350 kcal and 20 grams of protein ?-MVI with minerals daily ?-Liberalize diet to regular for widest variety of meal selections ? ?NUTRITION DIAGNOSIS:  ? ?Increased nutrient needs related to cancer and cancer related treatments as evidenced by estimated needs. ? ?GOAL:  ? ?Patient will meet greater than or equal to 90% of their needs ? ?MONITOR:  ? ?PO intake, Supplement acceptance, Labs, Weight trends, Skin, I & O's ? ?REASON FOR ASSESSMENT:  ? ?Malnutrition Screening Tool ?  ? ?ASSESSMENT:  ? ?Kathy Morton is a 76 y.o. female with medical history significant of DVT, PE presenting for segmental pulmonary embolism in the left lower lobe on the CT angio ordered by her physician.  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  Patient does not report any chest pain or pressure or shortness of breath. ? ?Pt admitted with pulmonary embolism.  ? ?Reviewed I/O's: +90 ml x 24 hours and +590 ml since admission ? ?Pt unavailable at time of visit (in with [facility]). RD unable to obtain further nutrition-related history or complete nutrition-focused physical exam at this time.   ? ?Case discussed with RN; pt requesting Ensure supplement. RD to order.  ? ?Pt currently on a heart healthy diet. Documented meal completions 100%. RD will liberalize diet to regular for widest variety of meal selections to help promote adequate oral intake.  ? ?Reviewed wt hx; pt has experienced a 6.8% wt loss over the past month, which is significant for time frame.  ? ?Pt at high risk for malnutrition, however, unable to identify at this time. Pt would benefit from addition of oral nutrition supplements.  ? ?Medications reviewed and include folic acid.  ? ?Labs reviewed: Na: 128.   ? ?Diet Order:   ?Diet Order   ? ?       ?  Diet Heart Room service appropriate? Yes; Fluid consistency: Thin   Diet effective now       ?  ? ?  ?  ? ?  ? ? ?EDUCATION NEEDS:  ? ?No education needs have been identified at this time ? ?Skin:  Skin Assessment: Reviewed RN Assessment ? ?Last BM:  03/07/21 ? ?Height:  ? ?Ht Readings from Last 1 Encounters:  ?03/06/21 '5\' 3"'$  (1.6 m)  ? ? ?Weight:  ? ?Wt Readings from Last 1 Encounters:  ?03/06/21 64.4 kg  ? ? ?Ideal Body Weight:  52.3 kg ? ?BMI:  Body mass index is 25.15 kg/m?. ? ?Estimated Nutritional Needs:  ? ?Kcal:  1750-1950 ? ?Protein:  85-100 grams ? ?Fluid:  > 1.7 L ? ? ? ?Loistine Chance, RD, LDN, CDCES ?Registered Dietitian II ?Certified Diabetes Care and Education Specialist ?Please refer to [facility] for RD and/or RD on-call/weekend/after hours pager  ?

## 2021-03-07 NOTE — Progress Notes (Signed)
Progress Note    Kathy Morton  TGG:[id number] DOB: [birth year]  DOA: 03/05/2021 PCP: Leonides Sake, MD      Brief Narrative:    Medical records reviewed and are as summarized below:  Kathy Morton is a 76 y.o. 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  She complained of shortness of breath.     Principal Problem:   Pulmonary embolism (HCC) Active Problems:   SOB (shortness of breath)   Hypertension   Hyponatremia   Malignant neoplasm of ovary (HCC)   Depression    Acute pulmonary embolism: Discontinue IV heparin drip.  Change to  treatment dose Eliquis for acute PE.  Acute on chronic hyponatremia: Improved.  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  Follow-up with oncologist, Dr. Janese Banks.    Generalized weakness: PT recommended home health therapy at discharge.  However, patient said she feels too weak to go home today.  She is contemplating going to SNF.  Other comorbidities include peripheral neuropathy, depression,     Diet Order             Diet regular Room service appropriate? Yes; Fluid consistency: Thin  Diet effective now                       Consultants: None  Procedures: None    Medications:    apixaban  10 mg Oral BID   Followed by   Derrill Memo ON 03/14/2021] apixaban  5 mg Oral BID   Chlorhexidine Gluconate Cloth  6 each Topical Daily   feeding supplement  237 mL Oral BID BM   folic acid  1 mg Oral Daily   multivitamin with minerals  1 tablet Oral Daily   Continuous Infusions:     Anti-infectives (From admission, onward)     None              Family Communication/Anticipated D/C date and plan/Code Status   DVT prophylaxis: SCDs Start: 03/05/21 2009 apixaban (ELIQUIS) tablet 10 mg  apixaban (ELIQUIS) tablet 5 mg     Code Status: Full Code  Family Communication: Plan was discussed with Tim, son, over the phone. Disposition Plan: Plan to discharge home tomorrow   Status is: Observation The patient will require care spanning > 2 midnights and should be moved to inpatient because: IV heparin             Subjective:   She complains of generalized weakness.  She says she feels too weak to go home today.  No chest pain or shortness of breath.  Objective:    Vitals:   03/07/21 1100 03/07/21 1137 03/07/21 1347 03/07/21 1538  BP: (!) 161/88 (!) 146/86  (!) 146/87  Pulse: 93 93  100  Resp: (!) '22 20  18  '$ Temp: 98.3 F (36.8 C)   98.3 F (36.8 C)  TempSrc: Oral   Oral  SpO2: 96%  99% 98%  Weight:      Height:       No data found.   Intake/Output Summary (Last 24 hours) at 03/07/2021 1657 Last data filed at 03/07/2021 1441  Gross per 24 hour  Intake 569.8 ml  Output --  Net 569.8 ml   Filed Weights   03/06/21 1726  Weight: 64.4 kg    Exam:  GEN: NAD SKIN: No rash EYES: EOMI ENT: MMM CV: RRR PULM: CTA B ABD: soft, ND, NT, +BS CNS: AAO x 3, non focal EXT: No edema or tenderness        Data Reviewed:   I have personally reviewed following labs and imaging studies:  Labs: Labs show the following:   Basic Metabolic Panel: Recent Labs  Lab 03/01/21 0746 03/05/21 1657 03/07/21 0622  NA 130* 125* 128*  K 3.8 3.7 3.5  CL 96* 95* 98  CO2 26 19* 21*  GLUCOSE 122* 106* 121*  BUN 27* 36* 17  CREATININE 0.48 0.48 0.39*  CALCIUM 9.4 9.7 8.7*   GFR Estimated Creatinine Clearance: 54 mL/min (A) (by C-G formula based on SCr of 0.39 mg/dL (L)). Liver Function Tests: Recent Labs  Lab 03/01/21 0746  AST 25  ALT 27  ALKPHOS 69  BILITOT 0.4  PROT 7.0   ALBUMIN 3.9   No results for input(s): LIPASE, AMYLASE in the last 168 hours. No results for input(s): AMMONIA in the last 168 hours. Coagulation profile No results for input(s): INR, PROTIME in the last 168 hours.  CBC: Recent Labs  Lab 03/01/21 0746 03/05/21 1657 03/07/21 0622  WBC 13.1* 9.1 8.4  NEUTROABS 10.7* 7.3 5.7  HGB 13.4 14.5 13.8  HCT 40.5 41.5 40.5  MCV 94.2 91.6 92.7  PLT 333 254 212   Cardiac Enzymes: No results for input(s): CKTOTAL, CKMB, CKMBINDEX, TROPONINI in the last 168 hours. BNP (last 3 results) No results for input(s): PROBNP in the last 8760 hours. CBG: No results for input(s): GLUCAP in the last 168 hours. D-Dimer: No results for input(s): DDIMER in the last 72 hours. Hgb A1c: No results for input(s): HGBA1C in the last 72 hours. Lipid Profile: No results for input(s): CHOL, HDL, LDLCALC, TRIG, CHOLHDL, LDLDIRECT in the last 72 hours. Thyroid function studies: No results for input(s): TSH, T4TOTAL, T3FREE, THYROIDAB in the last 72 hours.  Invalid input(s): FREET3 Anemia work up: No results for input(s): VITAMINB12, FOLATE, FERRITIN, TIBC, IRON, RETICCTPCT in the last 72 hours. Sepsis Labs: Recent Labs  Lab 03/01/21 0746 03/05/21 1657 03/07/21 0622  WBC 13.1* 9.1 8.4    Microbiology Recent Results (from the past 240 hour(s))  Resp Panel by RT-PCR (Flu A&B, Covid) Nasopharyngeal Swab     Status: None   Collection Time: 03/05/21  4:57 PM   Specimen: Nasopharyngeal Swab; Nasopharyngeal(NP) swabs in vial transport medium  Result Value Ref Range Status   SARS Coronavirus 2 by RT PCR NEGATIVE NEGATIVE Final    Comment: (NOTE) SARS-CoV-2 target nucleic acids are NOT DETECTED.  The SARS-CoV-2 RNA is generally detectable in upper respiratory specimens during the acute phase of infection. The lowest concentration of SARS-CoV-2 viral copies this assay can detect is 138 copies/mL. A negative result does not preclude SARS-Cov-2 infection and  should not be used as the sole basis for treatment or other patient management decisions. A negative result may occur with  improper specimen collection/handling, submission of specimen other than nasopharyngeal swab, presence of viral mutation(s) within the areas targeted by this assay, and inadequate number of viral copies(<138 copies/mL). A negative result must be combined with clinical observations, patient history, and epidemiological information. The expected result is Negative.  Fact Sheet for Patients:  EntrepreneurPulse.com.au  Fact Sheet  for Healthcare Providers:  IncredibleEmployment.be  This test is no t yet approved or cleared by the [place] and  has been authorized for detection and/or diagnosis of SARS-CoV-2 by FDA under an Emergency Use Authorization (EUA). This EUA will remain  in effect (meaning this test can be used) for the duration of the COVID-19 declaration under Section 564(b)(1) of the Act, 21 U.S.C.section 360bbb-3(b)(1), unless the authorization is terminated  or revoked sooner.       Influenza A by PCR NEGATIVE NEGATIVE Final   Influenza B by PCR NEGATIVE NEGATIVE Final    Comment: (NOTE) The Xpert Xpress SARS-CoV-2/FLU/RSV plus assay is intended as an aid in the diagnosis of influenza from Nasopharyngeal swab specimens and should not be used as a sole basis for treatment. Nasal washings and aspirates are unacceptable for Xpert Xpress SARS-CoV-2/FLU/RSV testing.  Fact Sheet for Patients: EntrepreneurPulse.com.au  Fact Sheet for Healthcare Providers: IncredibleEmployment.be  This test is not yet approved or cleared by the [place] FDA and has been authorized for detection and/or diagnosis of SARS-CoV-2 by FDA under an Emergency Use Authorization (EUA). This EUA will remain in effect (meaning this test can be used) for the duration of the COVID-19 declaration  under Section 564(b)(1) of the Act, 21 U.S.C. section 360bbb-3(b)(1), unless the authorization is terminated or revoked.  Performed at [facility], [address], [place], [place] [address]     Procedures and diagnostic studies:  DG Chest [facility] 1 View  Result Date: 03/05/2021 CLINICAL DATA:  sob EXAM: PORTABLE CHEST 1 VIEW COMPARISON:  January 01, 2021 FINDINGS: The cardiomediastinal silhouette is unchanged in contour.RIGHT chest port with tip terminating over the SVC. No pleural effusion. No pneumothorax. No acute pleuroparenchymal abnormality. Visualized abdomen is unremarkable. Multilevel degenerative changes of the thoracic spine. IMPRESSION: No acute cardiopulmonary abnormality. LEFT lower lobe pulmonary embolism is not visualized radiographically. Electronically Signed   By: Valentino Saxon M.D.   On: 03/05/2021 17:29               LOS: 0 days   Regnald Bowens  [facility]   Pager on [email or web address]. If 7PM-7AM, please contact night-coverage at [email or web address]     03/07/2021, 4:57 PM

## 2021-03-07 NOTE — Consult Note (Signed)
ANTICOAGULATION CONSULT NOTE - Follow Up Consult ? ?Pharmacy Consult for Apixaban ?Indication: pulmonary embolus ? ?No Known Allergies ? ?Patient Measurements: ?Height: '5\' 3"'$  (160 cm) ?Weight: 64.4 kg (142 lb) ?IBW/kg (Calculated) : 52.4 ? ?Vital Signs: ?Temp: 98.6 ?F (37 ?C) (03/06 0439) ?Temp Source: Oral (03/06 7322) ?BP: 142/84 (03/06 0439) ?Pulse Rate: 91 (03/06 0439) ? ?Labs: ?Recent Labs  ?  03/05/21 ?1657 03/05/21 ?1931 03/05/21 ?2313 03/06/21 ?0254 03/06/21 ?2706 03/06/21 ?1300 03/07/21 ?0013 03/07/21 ?2376  ?HGB 14.5  --   --   --   --   --   --  13.8  ?HCT 41.5  --   --   --   --   --   --  40.5  ?PLT 254  --   --   --   --   --   --  212  ?APTT  --   --   --  34  --  143* 155*  --   ?HEPARINUNFRC  --   --   --  >1.10*  --   --  0.82*  --   ?CREATININE 0.48  --   --   --   --   --   --  0.39*  ?TROPONINIHS 55* 59* 62*  --  60*  --   --   --   ? ? ?Estimated Creatinine Clearance: 54 mL/min (A) (by C-G formula based on SCr of 0.39 mg/dL (L)). ? ? ?Medications:  ?Scheduled:  ? apixaban  10 mg Oral BID  ? Followed by  ? [START ON 03/14/2021] apixaban  5 mg Oral BID  ? Chlorhexidine Gluconate Cloth  6 each Topical Daily  ? folic acid  1 mg Oral Daily  ? ? ?Assessment: ?76 year old female with history of DVT on low dose eliquis presents to ED following CT result showing segmental sized pulmonary embolism in the left lower lobe. Eliquis dosing was increased from 2.5 bid to 10 bid per oncology and patient presented to ED for evaluation same day. Pharmacy consulted to transition from heparin to Eliquis ? ?  ?Plan:  ?Stop heparin drip ?Start Apixaban at '10mg'$  BID x 7 days, then '5mg'$  BID.  ? ?Tavio Biegel Rodriguez-Guzman PharmD, BCPS ?03/07/2021 8:27 AM ? ? ?

## 2021-03-07 NOTE — TOC Initial Note (Signed)
Transition of Care (TOC) - Initial/Assessment Note  ? ? ?Patient Details  ?Name: Kathy Morton ?MRN: [id number] ?Date of Birth: [birth year] ? ?Transition of Care (TOC) CM/SW Contact:    ?Pete Pelt, RN ?Phone Number: ?03/07/2021, 3:42 PM ? ?Clinical Narrative:   Patient lives at home alone.  Son is able to stay with her a few days after discharge but then she will be alone.  She has no friends, neighbors, church family able to stay with her or assist her.  She is currently concerned because she is very weak.   ? ?Son states she hopes to get treatment and become stronger and confident about living independent.  ? ?Son transports patient to appointments and patient and family have no concerns about medications. ? ?Family asked for caregiving resources.  RNCM provided [place], [place] and [employer] information as well as recommending to [employer] for review of benefits.  Private caregiver discussed, family has no response at this time.  PT will review assessment and recommendations. ? ?[facility] Health can be provided through [facility]. For patient.  TOC to follow.              ? ? ?Expected Discharge Plan: Home/Self Care ?Barriers to Discharge: Continued Medical Work up ? ? ?Patient Goals and CMS Choice ?Patient states their goals for this hospitalization and ongoing recovery are:: To go home, get my treatments and be able to live independently ?  ?Choice offered to / list presented to : NA ? ?Expected Discharge Plan and Services ?Expected Discharge Plan: Home/Self Care ?  ?Discharge Planning Services: CM Consult ?  ?Living arrangements for the past 2 months: [place] ?                ?  ?  ?  ?  ?  ?  ?  ?  ?  ?  ? ?Prior Living Arrangements/Services ?Living arrangements for the past 2 months: [place] ?Lives with:: Self ?Patient language and need for interpreter reviewed:: Yes (No interpreter required) ?Do you feel safe going back to the place  where you live?: No   Patient says she does not feel strong enough to return home  ?Need for Family Participation in Patient Care: Yes (Comment) ?Care giver support system in place?: Yes (comment) ?  ?Criminal Activity/Legal Involvement Pertinent to Current Situation/Hospitalization: No - Comment as needed ? ?Activities of Daily Living ?Home Assistive Devices/Equipment: None ?ADL Screening (condition at time of admission) ?Patient's cognitive ability adequate to safely complete daily activities?: Yes ?Is the patient deaf or have difficulty hearing?: No ?Does the patient have difficulty seeing, even when wearing glasses/contacts?: No ?Does the patient have difficulty concentrating, remembering, or making decisions?: Yes ?Patient able to express need for assistance with ADLs?: Yes ?Does the patient have difficulty dressing or bathing?: Yes ?Independently performs ADLs?: No ?Does the patient have difficulty walking or climbing stairs?: Yes ?Weakness of Legs: None ?Weakness of Arms/Hands: None ? ?Permission Sought/Granted ?Permission sought to share information with : Case Manager ?Permission granted to share information with : Yes, Verbal Permission Granted ?   ? Permission granted to share info w AGENCY: Home health, DME or rehab facilities as recommended ?   ?   ? ?Emotional Assessment ?Appearance:: Appears stated age ?Attitude/Demeanor/Rapport: Gracious, Engaged ?Affect (typically observed): Pleasant, Apprehensive, Appropriate ?Orientation: : Oriented to Self, Oriented to Place, Oriented to  Time, Oriented to Situation ?Alcohol / Substance Use: Not Applicable ?Psych  Involvement: No (comment) ? ?Admission diagnosis:  Pulmonary embolism ([place]) [I26.99] ?Pulmonary embolism, other, unspecified chronicity, unspecified whether acute cor pulmonale present ([place]) [I26.99] ?Patient Active Problem List  ? Diagnosis Date Noted  ? Pulmonary embolism ([place]) 03/05/2021  ? SOB (shortness of breath) 03/05/2021  ? Genetic testing  02/22/2021  ? Family history of breast cancer 01/25/2021  ? Family history of bladder cancer 01/25/2021  ? 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 Malignant neoplasm of ovary ([place]) 01/13/2021  ? Iron deficiency anemia 01/13/2021  ? Ascites 01/01/2021  ? 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 Hyponatremia 01/01/2021  ? Hypokalemia 01/01/2021  ? Leg swelling 01/01/2021  ? Goals of care, counseling/discussion   ? Anxiety 06/17/2019  ? Depression 08/19/2018  ? Binge eating disorder 06/01/2017  ? Cold sore 10/25/2016  ? Osteopenia of femoral neck, bilateral 04/26/2016  ? Benign paroxysmal positional vertigo 12/22/2015  ? Positive occult stool blood test   ? Internal hemorrhoids   ? Colon cancer screening 11/12/2015  ? Vaginal atrophy 05/22/2015  ? Hyperlipidemia 12/14/2014  ? Hypertension 12/14/2014  ? Chronic constipation 12/14/2014  ? Insomnia 12/14/2014  ? PTSD (post-traumatic stress disorder) 11/09/2014  ? Migraines 11/09/2014  ? History of DVT (deep vein thrombosis) 11/09/2014  ? ?PCP:  Leonides Sake, MD ?Pharmacy:   ?[id number]Phone: [phone]: [phone]? ?[place]?[place]?Phone: [phone]: [phone]? ?[facility] [address] [place]?Phone: [phone]: [phone]? ? ? ? ?Social Determinants of Health (SDOH) Interventions ?  ? ?Readmission Risk Interventions ?No flowsheet data found. ? ? ?

## 2021-03-07 NOTE — Evaluation (Signed)
Physical Therapy Evaluation ?Patient Details ?Name: Kathy Morton ?MRN: [id number] ?DOB: 10/21/1945 ?Today's Date: 03/07/2021 ? ?History of Present Illness ? MARJON DOXTATER is a 76 y.o. female with medical history significant of DVT, PE presenting for segmental pulmonary embolism in the left lower lobe on the CT angio ordered by her physician.  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  Patient does not report any chest pain or pressure or shortness of breath. ?  ?Clinical Impression ? Patient received in bed, she is agreeable to PT assessment. Son, [name] present in room. Patient is mod independent with bed mobility, transfers with min assist, ambulated in hallway with single hand held assist ( min A). Patient is slightly unsteady and would benefit from assistive device at this time. VSS throughout session.  She will continue to benefit from skilled PT while here to improve strength and activity tolerance.      ?   ? ?Recommendations for follow up therapy are one component of a multi-disciplinary discharge planning process, led by the attending physician.  Recommendations may be updated based on patient status, additional functional criteria and insurance authorization. ? ?Follow Up Recommendations Home health PT ? ?  ?Assistance Recommended at Discharge Intermittent Supervision/Assistance  ?Patient can return home with the following ? A little help with walking and/or transfers;A little help with bathing/dressing/bathroom;Help with stairs or ramp for entrance;Assistance with cooking/housework ? ?  ?Equipment Recommendations None recommended by PT (has rollator)  ?Recommendations for Other Services ?    ?  ?Functional Status Assessment Patient has had a recent decline in their functional status and demonstrates the ability to make significant improvements in function in a reasonable and predictable amount of time.  ? ?  ?Precautions / Restrictions Precautions ?Precautions:  Fall ?Restrictions ?Weight Bearing Restrictions: No  ? ?  ? ?Mobility ? Bed Mobility ?Overal bed mobility: Modified Independent ?  ?  ?  ?  ?  ?  ?  ?  ? ?Transfers ?Overall transfer level: Needs assistance ?Equipment used: 1 person hand held assist ?Transfers: Sit to/from Stand ?Sit to Stand: Min assist ?  ?  ?  ?  ?  ?  ?  ? ?Ambulation/Gait ?Ambulation/Gait assistance: Min assist ?Gait Distance (Feet): 100 Feet ?Assistive device: 1 person hand held assist ?Gait Pattern/deviations: Step-through pattern, Decreased step length - right, Decreased step length - left, Drifts right/left ?Gait velocity: decr ?  ?  ?General Gait Details: patient slightly unsteady with ambulation. Requires at least single UE support for maintaining balance in halls. O2 saturations remained in upper 90%s and  HR into 110s ? ?Stairs ?  ?  ?  ?  ?  ? ?Wheelchair Mobility ?  ? ?Modified Rankin (Stroke Patients Only) ?  ? ?  ? ?Balance Overall balance assessment: Needs assistance ?Sitting-balance support: Feet supported ?Sitting balance-Leahy Scale: Good ?  ?  ?Standing balance support: Single extremity supported, During functional activity ?Standing balance-Leahy Scale: Good ?  ?  ?  ?  ?  ?  ?  ?  ?  ?  ?  ?  ?   ? ? ? ?Pertinent Vitals/Pain Pain Assessment ?Pain Assessment: No/denies pain  ? ? ?Home Living Family/patient expects to be discharged to:: Private residence ?Living Arrangements: Alone ?Available Help at Discharge: Family;Available PRN/intermittently ?Type of Home: House ?Home Access: Stairs to enter ?Entrance Stairs-Rails: Right ?Entrance Stairs-Number of Steps: 5 ?  ?Home Layout: One level ?Home Equipment: Rollator (4 wheels) ?   ?  ?  Prior Function Prior Level of Function : Independent/Modified Independent ?  ?  ?  ?  ?  ?  ?Mobility Comments: did not use device prior to admission. She has rollator, but has never used it. ?ADLs Comments: was independent ?  ? ? ?Hand Dominance  ?   ? ?  ?Extremity/Trunk Assessment  ? Upper  Extremity Assessment ?Upper Extremity Assessment: Overall WFL for tasks assessed ?  ? ?Lower Extremity Assessment ?Lower Extremity Assessment: Generalized weakness ?  ? ?Cervical / Trunk Assessment ?Cervical / Trunk Assessment: Normal  ?Communication  ? Communication: No difficulties  ?Cognition Arousal/Alertness: Awake/alert ?Behavior During Therapy: [facility] for tasks assessed/performed ?Overall Cognitive Status: Within Functional Limits for tasks assessed ?  ?  ?  ?  ?  ?  ?  ?  ?  ?  ?  ?  ?  ?  ?  ?  ?  ?  ?  ? ?  ?General Comments   ? ?  ?Exercises    ? ?Assessment/Plan  ?  ?PT Assessment Patient needs continued PT services  ?PT Problem List Decreased strength;Decreased mobility;Decreased activity tolerance;Decreased balance;Decreased knowledge of use of DME ? ?   ?  ?PT Treatment Interventions DME instruction;Gait training;Therapeutic exercise;Functional mobility training;Therapeutic activities;Stair training;Patient/family education   ? ?PT Goals (Current goals can be found in the Care Plan section)  ?Acute Rehab PT Goals ?Patient Stated Goal: to return home ?PT Goal Formulation: With patient/family ?Time For Goal Achievement: 03/21/21 ?Potential to Achieve Goals: Fair ? ?  ?Frequency Min 2X/week ?  ? ? ?Co-evaluation   ?  ?  ?  ?  ? ? ?  ?AM-PAC PT "6 Clicks" Mobility  ?Outcome Measure Help needed turning from your back to your side while in a flat bed without using bedrails?: None ?Help needed moving from lying on your back to sitting on the side of a flat bed without using bedrails?: A Little ?Help needed moving to and from a bed to a chair (including a wheelchair)?: A Little ?Help needed standing up from a chair using your arms (e.g., wheelchair or bedside chair)?: A Little ?Help needed to walk in hospital room?: A Little ?Help needed climbing 3-5 steps with a railing? : A Lot ?6 Click Score: 18 ? ?  ?End of Session Equipment Utilized During Treatment: Gait belt ?Activity Tolerance: Patient tolerated  treatment well ?Patient left: in bed;with call bell/phone within reach;with bed alarm set ?Nurse Communication: Mobility status ?PT Visit Diagnosis: Unsteadiness on feet (R26.81);Muscle weakness (generalized) (M62.81) ?  ? ?Time: 3154-0086 ?PT Time Calculation (min) (ACUTE ONLY): 23 min ? ? ?Charges:   PT Evaluation ?$PT Eval Moderate Complexity: 1 Mod ?PT Treatments ?$Gait Training: 8-22 mins ?  ?   ? ? ?Amanda Cockayne, PT, GCS ?03/07/21,1:57 PM ? ?

## 2021-03-08 ENCOUNTER — Telehealth: Payer: Self-pay | Admitting: *Deleted

## 2021-03-08 ENCOUNTER — Telehealth: Payer: Self-pay

## 2021-03-08 ENCOUNTER — Other Ambulatory Visit: Payer: Self-pay

## 2021-03-08 DIAGNOSIS — C561 Malignant neoplasm of right ovary: Secondary | ICD-10-CM

## 2021-03-08 DIAGNOSIS — C569 Malignant neoplasm of unspecified ovary: Secondary | ICD-10-CM

## 2021-03-08 DIAGNOSIS — I2699 Other pulmonary embolism without acute cor pulmonale: Secondary | ICD-10-CM | POA: Diagnosis not present

## 2021-03-08 DIAGNOSIS — E871 Hypo-osmolality and hyponatremia: Secondary | ICD-10-CM

## 2021-03-08 LAB — BASIC METABOLIC PANEL
Anion gap: 10 (ref 5–15)
BUN: 17 mg/dL (ref 8–23)
CO2: 23 mmol/L (ref 22–32)
Calcium: 9.2 mg/dL (ref 8.9–10.3)
Chloride: 98 mmol/L (ref 98–111)
Creatinine, Ser: 0.59 mg/dL (ref 0.44–1.00)
GFR, Estimated: >60 mL/min (ref >60)
Glucose, Bld: 114 mg/dL — ABNORMAL HIGH (ref 70–99)
Potassium: 3.8 mmol/L (ref 3.5–5.1)
Sodium: 131 mmol/L — ABNORMAL LOW (ref 135–145)

## 2021-03-08 MED ORDER — HEPARIN SOD (PORK) LOCK FLUSH 100 UNIT/ML IV SOLN
500.0000 [IU] | Freq: Once | INTRAVENOUS | Status: AC
  Administered 2021-03-08: 500 [IU] via INTRAVENOUS
  Filled 2021-03-08: qty 5

## 2021-03-08 MED ORDER — APIXABAN 5 MG PO TABS: 5.0000 mg | ORAL_TABLET | Freq: Two times a day (BID) | ORAL | 0 refills | Status: DC

## 2021-03-08 MED ORDER — APIXABAN 5 MG PO TABS: 10.0000 mg | ORAL_TABLET | Freq: Two times a day (BID) | ORAL | 0 refills | Status: DC

## 2021-03-08 NOTE — Telephone Encounter (Signed)
Patient son Christia Reading called reporting that patient is still in hospital and has an appointment tomorrow He thought with Dr Janese Banks, but it is with Dr Theora Gianotti) he is asking if since patient cannot come to office if she can be seen in hospital instead. Please advise/ return his call ?

## 2021-03-08 NOTE — TOC Progression Note (Signed)
Transition of Care (TOC) - Progression Note  ? ? ?Patient Details  ?Name: Kathy Morton ?MRN: [id number] ?Date of Birth: 05-15-1945 ? ?Transition of Care (TOC) CM/SW Contact  ?Pete Pelt, RN ?Phone Number: ?03/08/2021, 5:22 PM ? ?Clinical Narrative:   Spoke with  son tim, and he is appealing discharge with the hospital.  Care team and MD aware. ? ? ? ?Expected Discharge Plan: Home/Self Care ?Barriers to Discharge: Continued Medical Work up ? ?Expected Discharge Plan and Services ?Expected Discharge Plan: Home/Self Care ?  ?Discharge Planning Services: CM Consult ?  ?Living arrangements for the past 2 months: [place] ?Expected Discharge Date: 03/08/21               ?  ?  ?  ?  ?  ?  ?  ?  ?  ?  ? ? ?Social Determinants of Health (SDOH) Interventions ?  ? ?Readmission Risk Interventions ?No flowsheet data found. ? ?

## 2021-03-08 NOTE — Telephone Encounter (Signed)
Received call back from son, Nicki Reaper. He is concerned that Ms. Bundrick is being discharged home and she cannot care for herself as she lives alone. Per report the physician approved short term rehab but insurance did not. She ambulated with PT. They are helping to arrange home health but they feel this is not enough. Encouraged him to again speak with her assigned case manager before she leaves. Referral entered for cancer center social worker to assist with further questions regarding placement. They are trying to to see if anyone can stay with her but this is proving difficult. They are asking for Scott to be point of contact as Ms. Aziz has difficulty in understanding and explaining things she has been told.Three sons involved in care and reports all are on medical POA (not visualized at this time). ?

## 2021-03-08 NOTE — TOC Progression Note (Addendum)
Transition of Care (TOC) - Progression Note  ? ? ?Patient Details  ?Name: Kathy Morton ?MRN: [id number] ?Date of Birth: Jul 03, 1945 ? ?Transition of Care (TOC) CM/SW Contact  ?Pete Pelt, RN ?Phone Number: ?03/08/2021, 1:25 PM ? ?Clinical Narrative:  Patient's son states that he does not believe patient can take care of herself.  He states that he will stay with her for a few days after discharge.  Patient is hesitant to stay with her sons.   ? ?Son is considering appealing discharge.  He will also speak with patient's other son to see if they can develop a plan to assist their mother.   ? ?Son will call RNCM back with information ?Toc to follow. ?Addendum:  53:  Patient leaving with Early unsure if she will go to son's house in [place] or home.  Will ntoify hh, has walker at bedside. ? ?Expected Discharge Plan: Home/Self Care ?Barriers to Discharge: Continued Medical Work up ? ?Expected Discharge Plan and Services ?Expected Discharge Plan: Home/Self Care ?  ?Discharge Planning Services: CM Consult ?  ?Living arrangements for the past 2 months: [place] ?Expected Discharge Date: 03/08/21               ?  ?  ?  ?  ?  ?  ?  ?  ?  ?  ? ? ?Social Determinants of Health (SDOH) Interventions ?  ? ?Readmission Risk Interventions ?No flowsheet data found. ? ?

## 2021-03-08 NOTE — Progress Notes (Signed)
Received Md order to discharge patient to home with home health.  Reviewed discharge instructions, home meds, prescriptions and follow up appointments  with patient and patient verbalized understanding.   Patient supplied walker at discharge.   ?

## 2021-03-08 NOTE — Telephone Encounter (Signed)
Received forwarded voicemail from son, Nicki Reaper, to return call regarding Ms. Perham. Call returned. No answer. Left voicemail with my direct number. ?

## 2021-03-08 NOTE — Progress Notes (Signed)
Physical Therapy Treatment ?Patient Details ?Name: Kathy Morton ?MRN: [id number] ?DOB: Jun 20, 1945 ?Today's Date: 03/08/2021 ? ? ?History of Present Illness Kathy Morton is a 76 y.o. female with medical history significant of DVT, PE presenting for segmental pulmonary embolism in the left lower lobe on the CT angio ordered by her physician.  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  Patient does not report any chest pain or pressure or shortness of breath. ? ?  ?PT Comments  ? ? Pt resting in bed upon PT arrival; pt agreeable to PT session but verbalizing concerns about falling at home (pt reports no recent falls at home) and energy levels managing at home alone (pt reports her son can stay with her for a couple days to assist).  During session pt modified independent with bed mobility; modified independent with transfers using RW; and CGA progressing to SBA ambulating 190 feet with RW use.  No loss of balance during sessions activities.  Pt reporting feeling tired in general during session and declined stairs trial.  Educated pt on reasoning behind HHPT recommendations although pt still requesting SNF (TOC and MD notified of pt's current mobility level, pt's request for SNF; also asked TOC regarding additional support services that may be available for pt at home).  Will continue to focus on strengthening, endurance, and progressive functional mobility during hospitalization. ?  ?Recommendations for follow up therapy are one component of a multi-disciplinary discharge planning process, led by the attending physician.  Recommendations may be updated based on patient status, additional functional criteria and insurance authorization. ? ?Follow Up Recommendations ? Home health PT ?  ?  ?Assistance Recommended at Discharge Intermittent Supervision/Assistance  ?Patient can return home with the following A little help with walking and/or transfers;A little help with  bathing/dressing/bathroom;Help with stairs or ramp for entrance;Assistance with cooking/housework;Assist for transportation ?  ?Equipment Recommendations ? Rolling walker (2 wheels)  ?  ?Recommendations for Other Services   ? ? ?  ?Precautions / Restrictions Precautions ?Precautions: Fall ?Restrictions ?Weight Bearing Restrictions: No  ?  ? ?Mobility ? Bed Mobility ?Overal bed mobility: Modified Independent ?  ?  ?  ?  ?  ?  ?General bed mobility comments: Semi-supine to/from sitting without any noted difficulties. ?  ? ?Transfers ?Overall transfer level: Modified independent ?Equipment used: Rolling walker (2 wheels) ?Transfers: Sit to/from Stand ?Sit to Stand: Modified independent (Device/Increase time) ?  ?  ?  ?  ?  ?General transfer comment: steady safe transfer with RW ?  ? ?Ambulation/Gait ?Ambulation/Gait assistance: Min guard, Supervision ?Gait Distance (Feet): 190 Feet ?Assistive device: Rolling walker (2 wheels) ?  ?Gait velocity: mildly decreased ?  ?  ?General Gait Details: mostly step through gait pattern; mild decreased stance time noted to R LE at times; steady with RW ? ? ?Stairs ?Stairs:  (pt declined stairs trial) ?  ?  ?  ?  ? ? ?Wheelchair Mobility ?  ? ?Modified Rankin (Stroke Patients Only) ?  ? ? ?  ?Balance Overall balance assessment: Needs assistance ?Sitting-balance support: No upper extremity supported, Feet supported ?Sitting balance-Leahy Scale: Normal ?Sitting balance - Comments: steady sitting reaching outside BOS ?  ?Standing balance support: Bilateral upper extremity supported, During functional activity ?Standing balance-Leahy Scale: Good ?Standing balance comment: steady ambulating with RW use ?  ?  ?  ?  ?  ?  ?  ?  ?  ?  ?  ?  ? ?  ?Cognition Arousal/Alertness: Awake/alert ?  Behavior During Therapy: Flat affect ?Overall Cognitive Status: Within Functional Limits for tasks assessed ?  ?  ?  ?  ?  ?  ?  ?  ?  ?  ?  ?  ?  ?  ?  ?  ?  ?  ?  ? ?  ?Exercises   ? ?  ?General Comments   Nursing cleared pt for participation in physical therapy.  Pt agreeable to PT session. ?  ?  ? ?Pertinent Vitals/Pain Pain Assessment ?Pain Assessment: No/denies pain ?Vitals (HR and O2 on room air) stable and WFL throughout treatment session.  ? ? ?Home Living   ?  ?  ?  ?  ?  ?  ?  ?  ?  ?   ?  ?Prior Function    ?  ?  ?   ? ?PT Goals (current goals can now be found in the care plan section) Acute Rehab PT Goals ?Patient Stated Goal: to return home ?PT Goal Formulation: With patient ?Time For Goal Achievement: 03/21/21 ?Potential to Achieve Goals: Fair ?Progress towards PT goals: Progressing toward goals ? ?  ?Frequency ? ? ? Min 2X/week ? ? ? ?  ?PT Plan Current plan remains appropriate  ? ? ?Co-evaluation   ?  ?  ?  ?  ? ?  ?AM-PAC PT "6 Clicks" Mobility   ?Outcome Measure ? Help needed turning from your back to your side while in a flat bed without using bedrails?: None ?Help needed moving from lying on your back to sitting on the side of a flat bed without using bedrails?: None ?Help needed moving to and from a bed to a chair (including a wheelchair)?: None ?  ?Help needed to walk in hospital room?: A Little ?Help needed climbing 3-5 steps with a railing? : A Little ?6 Click Score: 18 ? ?  ?End of Session Equipment Utilized During Treatment: Gait belt ?Activity Tolerance: Patient tolerated treatment well;Other (comment) (pt reporting feeling tired in general) ?Patient left: in bed;with call bell/phone within reach;with bed alarm set ?Nurse Communication: Mobility status;Precautions ?PT Visit Diagnosis: Unsteadiness on feet (R26.81);Muscle weakness (generalized) (M62.81) ?  ? ? ?Time: 1443-1540 ?PT Time Calculation (min) (ACUTE ONLY): 24 min ? ?Charges:  $Gait Training: 8-22 mins ?$Therapeutic Activity: 8-22 mins          ?          ? ?Leitha Bleak, PT ?03/08/21, 12:32 PM ? ? ?

## 2021-03-08 NOTE — Telephone Encounter (Signed)
Appointment cancelled. Will follow.

## 2021-03-08 NOTE — Telephone Encounter (Signed)
Patient's son called to report that the patient was being discharged from the hospital today. She did not qualify for short term rehab but the family does not think she is safe to be at home and are looking for placement options. The son stated that the case manager directed him to reach out to the [facility] since we were providing care. He would like our Education officer, museum to call him. ?

## 2021-03-08 NOTE — Discharge Summary (Addendum)
Physician Discharge Summary  Kathy Morton MWU:[id number] DOB: December 19, 1945 DOA: 03/05/2021  PCP: Leonides Sake, MD  Admit date: 03/05/2021 Discharge date: 03/08/2021  Discharge disposition: Home with home health   Recommendations for Outpatient Follow-Up:   Follow-up with PCP in 1 week Follow-up with Dr. Janese Banks, oncologist, as scheduled   Discharge Diagnosis:   Principal Problem:   Pulmonary embolism ([place]) Active Problems:   SOB (shortness of breath)   Hypertension   Hyponatremia   Malignant neoplasm of ovary ([place])   Depression    Discharge Condition: Stable.  Diet recommendation:  Diet Order             Diet general           Diet regular Room service appropriate? Yes; Fluid consistency: Thin  Diet effective now                     Code Status: Full Code     Hospital Course:   [name] is a 76 y.o. 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  She complained of shortness of breath.   She was found to have acute pulmonary embolism.  She was treated with IV heparin drip.  She was transitioned to treatment dose Eliquis for acute pulmonary embolism.  She has acute on chronic hyponatremia that has improved.  She complained of generalized weakness and she was evaluated by PT and OT.  Home health therapy was recommended.  Initially, patient and her sons Nicki Reaper and [name]) insisted that patient go to SNF because she was too weak to go home.  Unfortunately, patient was not eligible to go to SNF.  Medically, she is deemed stable for discharge to home today.  Discharge plan was discussed with Nicki Reaper, son, over the phone.     Discharge Exam:     Vitals:   03/08/21 1559 03/08/21 1610 03/08/21 1612 03/08/21 1615  BP: (!) 145/65   (!) 149/77  Pulse: 76 (!) 102 (!) 101 98  Resp: 17   17  Temp: 97.9 F (36.6 C)   98 F (36.7 C)  TempSrc: Oral   Oral  SpO2: 98%   95%  Weight:      Height:         GEN: NAD SKIN: Warm and dry EYES: EOMI ENT: MMM CV: RRR PULM: CTA B ABD: soft, ND, NT, +BS CNS: AAO x 3, non focal EXT: No edema or tenderness   The results of significant diagnostics from this hospitalization (including imaging, microbiology, ancillary and laboratory) are listed below for reference.     Procedures and Diagnostic Studies:   DG Chest Port 1 View  Result Date: 03/05/2021 CLINICAL DATA:  sob EXAM: PORTABLE CHEST 1 VIEW COMPARISON:  January 01, 2021 FINDINGS: The cardiomediastinal silhouette is unchanged in contour.RIGHT chest port with tip terminating over the SVC. No pleural effusion. No pneumothorax. No acute pleuroparenchymal abnormality. Visualized abdomen is unremarkable. Multilevel degenerative changes of the thoracic spine. IMPRESSION: No acute cardiopulmonary abnormality. LEFT lower lobe pulmonary embolism is not visualized radiographically. Electronically Signed   By: Valentino Saxon M.D.   On: 03/05/2021 17:29     Labs:   Basic Metabolic Panel:  Recent Labs  Lab 03/05/21 1657 03/07/21 0622 03/08/21 0814  NA 125* 128* 131*  K 3.7 3.5 3.8  CL 95* 98 98  CO2 19* 21* 23  GLUCOSE 106* 121* 114*  BUN 36* 17 17  CREATININE 0.48 0.39* 0.59  CALCIUM 9.7 8.7* 9.2   GFR Estimated Creatinine Clearance: 54 mL/min (by C-G formula based on SCr of 0.59 mg/dL). Liver Function Tests: No results for input(s): AST, ALT, ALKPHOS, BILITOT, PROT, ALBUMIN in the last 168 hours. No results for input(s): LIPASE, AMYLASE in the last 168 hours. No results for input(s): AMMONIA in the last 168 hours. Coagulation profile No results for input(s): INR, PROTIME in the last 168 hours.  CBC: Recent Labs  Lab  03/05/21 1657 03/07/21 0622  WBC 9.1 8.4  NEUTROABS 7.3 5.7  HGB 14.5 13.8  HCT 41.5 40.5  MCV 91.6 92.7  PLT 254 212   Cardiac Enzymes: No results for input(s): CKTOTAL, CKMB, CKMBINDEX, TROPONINI in the last 168 hours. BNP: Invalid input(s): POCBNP CBG: No results for input(s): GLUCAP in the last 168 hours. D-Dimer No results for input(s): DDIMER in the last 72 hours. Hgb A1c No results for input(s): HGBA1C in the last 72 hours. Lipid Profile No results for input(s): CHOL, HDL, LDLCALC, TRIG, CHOLHDL, LDLDIRECT in the last 72 hours. Thyroid function studies No results for input(s): TSH, T4TOTAL, T3FREE, THYROIDAB in the last 72 hours.  Invalid input(s): FREET3 Anemia work up No results for input(s): VITAMINB12, FOLATE, FERRITIN, TIBC, IRON, RETICCTPCT in the last 72 hours. Microbiology Recent Results (from the past 240 hour(s))  Resp Panel by RT-PCR (Flu A&B, Covid) Nasopharyngeal Swab     Status: None   Collection Time: 03/05/21  4:57 PM   Specimen: Nasopharyngeal Swab; Nasopharyngeal(NP) swabs in vial transport medium  Result Value Ref Range Status   SARS Coronavirus 2 by RT PCR NEGATIVE NEGATIVE Final    Comment: (NOTE) SARS-CoV-2 target nucleic acids are NOT DETECTED.  The SARS-CoV-2 RNA is generally detectable in upper respiratory specimens during the acute phase of infection. The lowest concentration of SARS-CoV-2 viral copies this assay can detect is 138 copies/mL. A negative result does not preclude SARS-Cov-2 infection and should not be used as the sole basis for treatment or other patient management decisions. A negative result may occur with  improper specimen collection/handling, submission of specimen other than nasopharyngeal swab, presence of viral mutation(s) within the areas targeted by this assay, and inadequate number of viral copies(<138 copies/mL). A negative result must be combined with clinical observations, patient history, and  epidemiological information. The expected result is Negative.  Fact Sheet for Patients:  EntrepreneurPulse.com.au  Fact Sheet for Healthcare Providers:  IncredibleEmployment.be  This test is no t yet approved or cleared by the [place] FDA and  has been authorized for detection and/or diagnosis of SARS-CoV-2 by FDA under an Emergency Use Authorization (EUA). This EUA will remain  in effect (meaning this test can be used) for the duration of the COVID-19 declaration under Section 564(b)(1) of the Act, 21 U.S.C.section 360bbb-3(b)(1), unless the authorization is terminated  or revoked sooner.       Influenza A by PCR NEGATIVE NEGATIVE Final   Influenza B by PCR NEGATIVE NEGATIVE Final    Comment: (NOTE) The Xpert Xpress SARS-CoV-2/FLU/RSV plus assay is intended as an aid in the diagnosis of influenza from Nasopharyngeal swab specimens and should not be used as a sole basis for treatment. Nasal washings and aspirates are unacceptable for Xpert Xpress  SARS-CoV-2/FLU/RSV testing.  Fact Sheet for Patients: EntrepreneurPulse.com.au  Fact Sheet for Healthcare Providers: IncredibleEmployment.be  This test is not yet approved or cleared by the [place] FDA and has been authorized for detection and/or diagnosis of SARS-CoV-2 by FDA under an Emergency Use Authorization (EUA). This EUA will remain in effect (meaning this test can be used) for the duration of the COVID-19 declaration under Section 564(b)(1) of the Act, 21 U.S.C. section 360bbb-3(b)(1), unless the authorization is terminated or revoked.  Performed at [facility], [address] [place], [place] [address]      Discharge Instructions:   Discharge Instructions     Diet general   Complete by: As directed    Face-to-face encounter (required for Medicare/Medicaid patients)   Complete by: As directed    I [place]  certify that this patient is under my care and that I, or a nurse practitioner or physician's assistant working with me, had a face-to-face encounter that meets the physician face-to-face encounter requirements with this patient on 03/08/2021. The encounter with the patient was in whole, or in part for the following medical condition(s) which is the primary reason for home health care (List medical condition): general weakness   The encounter with the patient was in whole, or in part, for the following medical condition, which is the primary reason for home health care: general weakness   I certify that, based on my findings, the following services are medically necessary home health services: Physical therapy   Reason for Medically Necessary Home Health Services: Therapy- Personnel officer, Public librarian   My clinical findings support the need for the above services: Unsafe ambulation due to balance issues   Further, I certify that my clinical findings support that this patient is homebound due to: Unsafe ambulation due to balance issues   Home Health   Complete by: As directed    To provide the following care/treatments:  PT OT     Increase activity slowly   Complete by: As directed       Allergies as of 03/08/2021   No Known Allergies      Medication List     STOP taking these medications    dexamethasone 2 MG tablet Commonly known as: DECADRON   dexamethasone 4 MG tablet Commonly known as: DECADRON   HYDROcodone-acetaminophen 5-325 MG tablet Commonly known as: Norco   ondansetron 8 MG tablet Commonly known as: ZOFRAN   prochlorperazine 10 MG tablet Commonly known as: COMPAZINE       TAKE these medications    acetaminophen 500 MG tablet Commonly known as: TYLENOL Take 500 mg by mouth every 6 (six) hours as needed for headache, fever or mild pain.   alendronate 70 MG tablet Commonly known as: FOSAMAX Take 70 mg by mouth once a week. Take with a  full glass of water on an empty stomach.   apixaban 5 MG Tabs tablet Commonly known as: ELIQUIS Take 2 tablets (10 mg total) by mouth 2 (two) times daily for 5 days. What changed:  medication strength how much to take   apixaban 5 MG Tabs tablet Commonly known as: ELIQUIS Take 1 tablet (5 mg total) by mouth 2 (two) times daily. Start taking on: March 14, 2021 What changed: You were already taking a medication with the same name, and this prescription was added. Make sure you understand how and when to take each.   atorvastatin 20 MG tablet Commonly known as: LIPITOR TAKE 1  TABLET BY MOUTH EVERY DAY   clonazePAM 1 MG tablet Commonly known as: KLONOPIN Take 1 mg by mouth daily as needed for anxiety.   DULoxetine 60 MG capsule Commonly known as: CYMBALTA Take 1 capsule (60 mg total) by mouth daily.   folic acid 1 MG tablet Commonly known as: FOLVITE Take 1 tablet (1 mg total) by mouth daily.   lidocaine-prilocaine cream Commonly known as: EMLA Apply to affected area once   loratadine 10 MG tablet Commonly known as: CLARITIN Take 10 mg by mouth daily.   LORazepam 0.5 MG tablet Commonly known as: ATIVAN TAKE 1 TABLET (0.5 MG TOTAL) BY MOUTH EVERY 6 (SIX) HOURS AS NEEDED (NAUSEA OR VOMITING).   QUEtiapine 100 MG tablet Commonly known as: SEROQUEL Take 1 tablet (100 mg total) by mouth at bedtime.   SUMAtriptan 100 MG tablet Commonly known as: IMITREX May repeat in 2 hours if headache persists or recurs.           If you experience worsening of your admission symptoms, develop shortness of breath, life threatening emergency, suicidal or homicidal thoughts you must seek medical attention immediately by calling 911 or calling your MD immediately  if symptoms less severe.   You must read complete instructions/literature along with all the possible adverse reactions/side effects for all the medicines you take and that have been prescribed to you. Take any new medicines  after you have completely understood and accept all the possible adverse reactions/side effects.    Please note   You were cared for by a hospitalist during your hospital stay. If you have any questions about your discharge medications or the care you received while you were in the hospital after you are discharged, you can call the unit and asked to speak with the hospitalist on call if the hospitalist that took care of you is not available. Once you are discharged, your primary care physician will handle any further medical issues. Please note that NO REFILLS for any discharge medications will be authorized once you are discharged, as it is imperative that you return to your primary care physician (or establish a relationship with a primary care physician if you do not have one) for your aftercare needs so that they can reassess your need for medications and monitor your lab values.       Time coordinating discharge: Greater than 30 minutes  Signed:  Masen Salvas  [facility] 03/08/2021, 5:11 PM   Pager on [email or web address]. If 7PM-7AM, please contact night-coverage at [email or web address]

## 2021-03-08 NOTE — Telephone Encounter (Signed)
Patient's son called to cancel GYN appointment tomorrow patient is in hospital. He states she may be discharged to short term rehab. ?

## 2021-03-08 NOTE — Telephone Encounter (Signed)
Son called and cancelled appointment per another recent message. Appointment cancelled.

## 2021-03-09 ENCOUNTER — Telehealth: Payer: Self-pay | Admitting: *Deleted

## 2021-03-09 ENCOUNTER — Other Ambulatory Visit: Payer: Self-pay

## 2021-03-09 ENCOUNTER — Encounter: Payer: Self-pay | Admitting: Licensed Clinical Social Worker

## 2021-03-09 ENCOUNTER — Inpatient Hospital Stay: Payer: Medicare PPO

## 2021-03-09 ENCOUNTER — Inpatient Hospital Stay: Payer: Medicare PPO | Attending: Hospice and Palliative Medicine | Admitting: Obstetrics and Gynecology

## 2021-03-09 VITALS — BP 156/90 | HR 109 | Temp 98.7°F | Resp 19 | Wt 145.0 lb

## 2021-03-09 DIAGNOSIS — R180 Malignant ascites: Secondary | ICD-10-CM | POA: Diagnosis not present

## 2021-03-09 DIAGNOSIS — F32A Depression, unspecified: Secondary | ICD-10-CM | POA: Diagnosis not present

## 2021-03-09 DIAGNOSIS — Z9221 Personal history of antineoplastic chemotherapy: Secondary | ICD-10-CM | POA: Diagnosis not present

## 2021-03-09 DIAGNOSIS — Z79899 Other long term (current) drug therapy: Secondary | ICD-10-CM | POA: Diagnosis not present

## 2021-03-09 DIAGNOSIS — Z8052 Family history of malignant neoplasm of bladder: Secondary | ICD-10-CM | POA: Insufficient documentation

## 2021-03-09 DIAGNOSIS — M8580 Other specified disorders of bone density and structure, unspecified site: Secondary | ICD-10-CM | POA: Insufficient documentation

## 2021-03-09 DIAGNOSIS — Z7901 Long term (current) use of anticoagulants: Secondary | ICD-10-CM | POA: Diagnosis not present

## 2021-03-09 DIAGNOSIS — Z66 Do not resuscitate: Secondary | ICD-10-CM | POA: Diagnosis not present

## 2021-03-09 DIAGNOSIS — F4310 Post-traumatic stress disorder, unspecified: Secondary | ICD-10-CM | POA: Insufficient documentation

## 2021-03-09 DIAGNOSIS — Z803 Family history of malignant neoplasm of breast: Secondary | ICD-10-CM | POA: Diagnosis not present

## 2021-03-09 DIAGNOSIS — D649 Anemia, unspecified: Secondary | ICD-10-CM | POA: Insufficient documentation

## 2021-03-09 DIAGNOSIS — E785 Hyperlipidemia, unspecified: Secondary | ICD-10-CM | POA: Insufficient documentation

## 2021-03-09 DIAGNOSIS — C579 Malignant neoplasm of female genital organ, unspecified: Secondary | ICD-10-CM | POA: Insufficient documentation

## 2021-03-09 DIAGNOSIS — C786 Secondary malignant neoplasm of retroperitoneum and peritoneum: Secondary | ICD-10-CM

## 2021-03-09 DIAGNOSIS — Z515 Encounter for palliative care: Secondary | ICD-10-CM | POA: Diagnosis not present

## 2021-03-09 DIAGNOSIS — I10 Essential (primary) hypertension: Secondary | ICD-10-CM | POA: Diagnosis not present

## 2021-03-09 DIAGNOSIS — Z86718 Personal history of other venous thrombosis and embolism: Secondary | ICD-10-CM | POA: Diagnosis not present

## 2021-03-09 DIAGNOSIS — C763 Malignant neoplasm of pelvis: Secondary | ICD-10-CM

## 2021-03-09 DIAGNOSIS — R5381 Other malaise: Secondary | ICD-10-CM | POA: Diagnosis not present

## 2021-03-09 DIAGNOSIS — I2699 Other pulmonary embolism without acute cor pulmonale: Secondary | ICD-10-CM | POA: Diagnosis not present

## 2021-03-09 DIAGNOSIS — F419 Anxiety disorder, unspecified: Secondary | ICD-10-CM | POA: Insufficient documentation

## 2021-03-09 MED ORDER — TRAZODONE HCL 50 MG PO TABS
50.0000 mg | ORAL_TABLET | Freq: Every evening | ORAL | Status: DC | PRN
  Filled 2021-03-09: qty 1

## 2021-03-09 NOTE — TOC Progression Note (Signed)
Transition of Care (TOC) - Progression Note  ? ? ?Patient Details  ?Name: Kathy Morton ?MRN: [id number] ?Date of Birth: 02/12/1945 ? ?Transition of Care (TOC) CM/SW Contact  ?Pete Pelt, RN ?Phone Number: ?03/09/2021, 11:09 AM ? ?Clinical Narrative:  Son agrees for patient to return home, he will stay with her to provide oversight.  Patient has an appointment at the cancer center, son will take her to appointment today. ? ? Patient's sons were under the impression that because she was weak she would qualify for SNF.   Physical Therapy, hospitalists explained to sons that patient does not qualify for SNF as she can ambulate. RNCM explained on several occasions that patient does not meet qualifications for SNF and resources were given for [place], Assisted Living, [facility], [facility], [place], Private Caregiving resources, and recommendation to [employer].  Scott stated to [facility] yesterday that he contacted cancer center and [facility] office multiple times and that he left resources at the hospital because he did not know what to do and was overwhelmed and did not feel he should have to make calls.  RNCM revisited resources available and the importance of obtaining as much information as possible to assist with decision-making, not just for this discharge but for future planning as well.  RNCM provided  her cell phone for contact information and did not hear back from [place] yesterday, the only response received was that he did not understand but had no questions. ? ? ?RNCM spoke with son, [name] yesterday and today.  Tim states that he is aware patient is mobile and he is planning to stay with her to provide assistance. RNCM explained to [name] that his brother Nicki Reaper called several facilities, including the cancer center to state that patient was too unsteady to return home.  Son [name] stated he explained situation to his brother and they have an understanding of her disposition at  this time.  [name] is willing to accept discharge and states patient will return home, and that sons are unable to care for patient in their home at this time.  Tim states that family will reach out to resources and discuss a future plan, but sons were under the impression that if patient needs Oncology surgery they did not need to plan discharge.  RNCM states family needs to have  a plan regardless, as patient needs assistance.  Tim agrees and states he will speak to brothers ? ?RNCM secured [place] for [facility] at [facility], [place] accepted patient as per Georgina Snell at [place], son accepts [facility] home health. ? ? ? ?Expected Discharge Plan: Home/Self Care ?Barriers to Discharge: Continued Medical Work up ? ?Expected Discharge Plan and Services ?Expected Discharge Plan: Home/Self Care ?  ?Discharge Planning Services: CM Consult ?  ?Living arrangements for the past 2 months: [place] ?Expected Discharge Date: 03/08/21               ?  ?  ?  ?  ?  ?  ?  ?  ?  ?  ? ? ?Social Determinants of Health (SDOH) Interventions ?  ? ?Readmission Risk Interventions ?No flowsheet data found. ? ?

## 2021-03-09 NOTE — Telephone Encounter (Signed)
Patient's son called to ask that his patient be put back on the schedule. He denies requesting the cancellation. ?

## 2021-03-09 NOTE — Telephone Encounter (Signed)
Patient's would also like to speak with Dr. Theora Gianotti regarding patient's current condition. ?

## 2021-03-09 NOTE — Plan of Care (Signed)
Pt ambulated around the nurse's station.  She was very steady but used her walker.  Very committed to doing for her self. MD wants the Discharge order left in for possible d/c tomorrow.  ?

## 2021-03-09 NOTE — Telephone Encounter (Signed)
Forward to triage nurse. ?

## 2021-03-09 NOTE — Progress Notes (Signed)
Gynecologic Oncology Interval Visit   Referring Provider: Dr. 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 G3P3 female who is seen in consultation from Dr. Janese Banks for malignant ascites with concern for ovarian cancer.   She received NACT starting 01/18/2021 cycle #1 carboplatin/paclitaxel with pegfilgrastim support and bevacizumab added on Day #9 cycle #1.   02/08/2021 cycle #2 carboplatin/paclitaxel/bevacizumab 03/01/2021 cycle #3 carboplatin/paclitaxel/bevacizumab  [facility] HRDGIS negative; negative for somatic BRCA1/2  IMPRESSION: 1. Study is positive for segmental sized pulmonary embolism in the left lower lobe (axial image 37 of series 2). 2. Resolution of large volume of malignant ascites noted on prior studies. There is a large amount of omental caking which persists on today's examination. 3. Resolution of previously noted pleural effusions with small amount of residual right-sided pleural thickening. 4. Cholelithiasis without evidence of acute cholecystitis at this time. 5. Aortic atherosclerosis. 6. Additional incidental findings, as above  She was started on anticoagulation for the PE.   0 Result Notes       Component Ref Range & Units 8 d ago 1 mo ago 2 mo ago  Cancer Antigen (CA) 125 0.0 - 38.1 U/mL 126.0 High   691.0 High  CM  2,336.0 High      She has been in the hospital for a prolonged period of time. She is not active. She plans to go back home.    Gynecologic Oncologic History Kathy Morton is a pleasant G3P3 female who is seen in consultation from Dr. Janese Banks for malignant ascites with concern for ovarian cancer.   She presented to the ER with shortness of breath, leg swelling, abdominal distention.  She underwent CT angio chest which was negative for PE however, low volume chest with small pleural effusions were noted.  CT abdomen pelvis with contrast showed ascites with features consistent  with underlying peritoneal carcinomatosis, asymmetric thickening and heterogeneity of the left ovary raising concern for primary ovarian cancer.  She underwent therapeutic paracentesis which removed approximately 12 L of fluid.  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  Rare cells demonstrate weak ER positivity.  CA-125- 2,336 CEA- 1.4 (normal)  At baseline patient lives alone and is independent of her ADL's. Son, Christia Reading, lives 51 min away in [place]. She has two other sons that live in [place].  She has shortness of breath.   She had a ECOG 3 and we recommended NACT.   Genetic testing: No pathogenic variants identified. 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   Hyponatremia 01/01/2021   Hypokalemia 01/01/2021   Leg swelling 01/01/2021   Goals of care, counseling/discussion    Anxiety 06/17/2019   Depression 08/19/2018   Binge eating disorder 06/01/2017   Cold sore 10/25/2016   Osteopenia of femoral neck, bilateral 04/26/2016   Benign paroxysmal positional vertigo 12/22/2015   Positive occult stool blood test    Internal hemorrhoids    Colon cancer screening 11/12/2015  Vaginal atrophy 05/22/2015   Hyperlipidemia 12/14/2014   Hypertension 12/14/2014   Chronic constipation 12/14/2014   Insomnia 12/14/2014   PTSD (post-traumatic stress disorder) 11/09/2014   Migraines 11/09/2014   History of DVT (deep vein thrombosis) 11/09/2014    Past  Medical History: Past Medical History:  Diagnosis Date   Anemia, folate and B12 deficiency 01/01/2021   Anxiety    Depression    DVT of leg (deep venous thrombosis) ([place])    Dysuria    Family history of bladder cancer    Family history of breast cancer    History of blood clots    Hypertension    Incontinence    Migraine    Recurrent UTI    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 [facility])     Past Surgical History: Past Surgical History:  Procedure Laterality Date   BREAST BIOPSY Left 01/06/13   benign finding of sclerosing adenosis   COLONOSCOPY WITH PROPOFOL N/A 11/29/2015   Procedure: COLONOSCOPY WITH PROPOFOL;  Surgeon: Jonathon Bellows, MD;  Location: [facility] ENDOSCOPY;  Service: Endoscopy;  Laterality: N/A;   IR IMAGING GUIDED PORT INSERTION  01/17/2021   IR PARACENTESIS  01/17/2021   KNEE SURGERY Left 2010   torn meniscus   TONSILECTOMY, ADENOIDECTOMY, BILATERAL MYRINGOTOMY AND TUBES      Past Gynecologic History:  Menarche: age 33 Post menopausal Denies history of STI Denies history of abnormal pap   OB History:  OB History  Gravida Para Term Preterm AB Living  '4 3     1 3  ' SAB IAB Ectopic Multiple Live Births  1            # Outcome Date GA Lbr Len/2nd Weight Sex Delivery Anes PTL Lv  4 Para           3 Para           2 Para           1 SAB             Obstetric Comments  1st Menstrual Cycle:  11  1st Pregnancy:  20    Family History: Family History  Problem Relation Age of Onset   Bladder Cancer Mother    Heart attack Father    Breast cancer Maternal Grandmother 80   Kidney disease Neg Hx     Social History: Social History   Socioeconomic History   Marital status: Divorced    Spouse name: Not on file   Number of children: Not on file   Years of education: Not on file   Highest education level: Some college, no degree  Occupational History   Occupation: retired   Tobacco Use   Smoking status: Never   Smokeless tobacco: Never  Vaping Use   Vaping  Use: Never used  Substance and Sexual Activity   Alcohol use: No   Drug use: No   Sexual activity: Not Currently    Birth control/protection: None  Other Topics Concern   Not on file  Social History Narrative   Worked at the [employer].   Social Determinants of Health   Financial Resource Strain: Not on file  Food Insecurity: Not on file  Transportation Needs: Not on file  Physical Activity: Not on file  Stress: Not on file  Social Connections: Not on file  Intimate Partner Violence: Not At Risk   Fear of Current or Ex-Partner: No   Emotionally Abused: No   Physically Abused: No   Sexually Abused:  No   Allergies: No Known Allergies  Current Medications: Current Outpatient Medications  Medication Sig Dispense Refill   acetaminophen (TYLENOL) 500 MG tablet Take 500 mg by mouth every 6 (six) hours as needed for headache, fever or mild pain.     apixaban (ELIQUIS) 5 MG TABS tablet Take 2 tablets (10 mg total) by mouth 2 (two) times daily for 5 days. 20 tablet 0   [START ON 03/14/2021] apixaban (ELIQUIS) 5 MG TABS tablet Take 1 tablet (5 mg total) by mouth 2 (two) times daily. 60 tablet 0   clonazePAM (KLONOPIN) 1 MG tablet Take 1 mg by mouth daily as needed for anxiety.     DULoxetine (CYMBALTA) 60 MG capsule Take 1 capsule (60 mg total) by mouth daily. 30 capsule 3   folic acid (FOLVITE) 1 MG tablet Take 1 tablet (1 mg total) by mouth daily. 30 tablet 2   lidocaine-prilocaine (EMLA) cream Apply to affected area once 30 g 3   loratadine (CLARITIN) 10 MG tablet Take 10 mg by mouth daily.     QUEtiapine (SEROQUEL) 100 MG tablet Take 1 tablet (100 mg total) by mouth at bedtime. 90 tablet 1   SUMAtriptan (IMITREX) 100 MG tablet May repeat in 2 hours if headache persists or recurs. 9 tablet 5   alendronate (FOSAMAX) 70 MG tablet Take 70 mg by mouth once a week. Take with a full glass of water on an empty stomach. (Patient not taking: Reported on 03/09/2021)     atorvastatin  (LIPITOR) 20 MG tablet TAKE 1 TABLET BY MOUTH EVERY DAY (Patient not taking: Reported on 03/09/2021) 90 tablet 0   LORazepam (ATIVAN) 0.5 MG tablet TAKE 1 TABLET (0.5 MG TOTAL) BY MOUTH EVERY 6 (SIX) HOURS AS NEEDED (NAUSEA OR VOMITING). (Patient not taking: Reported on 03/09/2021) 30 tablet 0   No current facility-administered medications for this visit.   Facility-Administered Medications Ordered in Other Visits  Medication Dose Route Frequency Provider Last Rate Last Admin   heparin lock flush 100 UNIT/ML injection            Review of Systems  General: fatigue/weakness/insomnia  HEENT: no complaints  Lungs: shortness of breath/cough  Cardiac: no complaints  GI: diarrhea o/w no complaints  GU: issues with bladder function no complaints  Musculoskeletal: no complaints  Extremities: no complaints  Skin: no complaints  Neuro: feeling sad o/w no complaints  Endocrine: no complaints  Psych: numbess/tingling no complaints        Objective:  Physical Examination:  BP (!) 156/90    Pulse (!) 109    Temp 98.7 F (37.1 C)    Resp 19    Wt 145 lb (65.8 kg)    SpO2 99%    BMI 25.69 kg/m     ECOG Performance Status: 3 - Symptomatic, >50% confined to bed  GENERAL: frail appearing female in no acute distress HEENT:  PERRL, neck supple  NODES:  No cervical, supraclavicular, axillary, or inguinal lymphadenopathy palpated.  LUNGS:  Clear to auscultation bilaterally.   HEART:  tachycardia rate, but regular  ABDOMEN:  Soft, nontender, nondistended. No masses EXTREMITIES:  No peripheral edema.   SKIN:  Clear with no obvious rashes or skin changes.  NEURO:  Nonfocal. Well oriented.  Appropriate affect.  Pelvic: EGBUS: no lesions Cervix: no lesions, nontender, mobile Vagina: no lesions, no discharge or bleeding Uterus: normal size, nontender, mobile Adnexa: no palpable masses, previously palpated cul-de-sac nodularity resolved.  Rectovaginal: confirmatory    Lab Review  Lab Results   Component Value Date   WBC 8.4 03/07/2021   HGB 13.8 03/07/2021   HCT 40.5 03/07/2021   MCV 92.7 03/07/2021   PLT 212 03/07/2021     Chemistry      Component Value Date/Time   NA 131 (L) 03/08/2021 0814   NA 135 03/10/2015 0923   K 3.8 03/08/2021 0814   CL 98 03/08/2021 0814   CO2 23 03/08/2021 0814   BUN 17 03/08/2021 0814   BUN 11 03/10/2015 0923   CREATININE 0.59 03/08/2021 0814   CREATININE 0.97 (H) 10/20/2016 0844      Component Value Date/Time   CALCIUM 9.2 03/08/2021 0814   ALKPHOS 69 03/01/2021 0746   AST 25 03/01/2021 0746   ALT 27 03/01/2021 0746   BILITOT 0.4 03/01/2021 0746   BILITOT <0.2 11/11/2014 1624    03/01/2021  Albumin 3.9 ( improved from 2.7)     Component Ref Range & Units 11 d ago   Cancer Antigen (CA) 125 0.0 - 38.1 U/mL 2,336.0 High     Component Ref Range & Units 11 d ago   CEA 0.0 - 4.7 ng/mL 1.4       Component Ref Range & Units 11 d ago   B Natriuretic Peptide 0.0 - 100.0 pg/mL 243.5 High     Radiologic Imaging:  CT CHEST ABDOMEN PELVIS W CONTRAST  Result Date: 03/05/2021 CLINICAL DATA:  76 year old female with history of ovarian cancer (serous carcinoma). Follow-up study. EXAM: CT CHEST, ABDOMEN, AND PELVIS WITH CONTRAST TECHNIQUE: Multidetector CT imaging of the chest, abdomen and pelvis was performed following the standard protocol during bolus administration of intravenous contrast. RADIATION DOSE REDUCTION: This exam was performed according to the departmental dose-optimization program which includes automated exposure control, adjustment of the mA and/or kV according to patient size and/or use of iterative reconstruction technique. CONTRAST:  165m OMNIPAQUE IOHEXOL 300 MG/ML  SOLN COMPARISON:  PET-CT 01/27/2021. CT of the chest, abdomen and pelvis 01/01/2021. FINDINGS: CT CHEST FINDINGS Cardiovascular: In the left lower lobe (axial image 37 of series 2) there is a filling defect in a segmental sized pulmonary artery, compatible with  pulmonary embolism. Heart size is mildly enlarged. There is no significant pericardial fluid, thickening or pericardial calcification. Aortic atherosclerosis. No definite coronary artery calcifications. Right internal jugular single-lumen porta cath with tip terminating at the superior cavoatrial junction. Mediastinum/Nodes: No pathologically enlarged mediastinal or hilar lymph nodes. Esophagus is unremarkable in appearance. No axillary lymphadenopathy. Lungs/Pleura: No suspicious appearing pulmonary nodules or masses are noted. No acute consolidative airspace disease. Previously noted pleural effusions have resolved. Trace residual right-sided pleural thickening. No left pleural effusion. Musculoskeletal: There are no aggressive appearing lytic or blastic lesions noted in the visualized portions of the skeleton. CT ABDOMEN PELVIS FINDINGS Hepatobiliary: No suspicious cystic or solid hepatic lesions. No intra or extrahepatic biliary ductal dilatation. Peripherally calcified gallstone measuring up to 2 cm in the fundus of the gallbladder. No findings to suggest an acute cholecystitis are noted at this time. Pancreas: No pancreatic mass. No pancreatic ductal dilatation. No pancreatic or peripancreatic fluid collections or inflammatory changes. Spleen: Unremarkable. Adrenals/Urinary Tract: Bilateral kidneys and bilateral adrenal glands are normal in appearance. No hydroureteronephrosis. Tiny amount of gas non dependently in the lumen of the urinary bladder. Urinary bladder is otherwise normal in appearance. Stomach/Bowel: Normal appearance of the stomach. No pathologic dilatation of small bowel or colon. Normal appendix. Vascular/Lymphatic: Aortic atherosclerosis, without evidence of aneurysm or dissection in the abdominal  or pelvic vasculature. No lymphadenopathy noted in the abdomen or pelvis. Reproductive: Uterus and ovaries are unremarkable in appearance. Other: Previously noted ascites has resolved. Extensive  omental caking is noted, best appreciated on axial image 75 of series 2. Musculoskeletal: There are no aggressive appearing lytic or blastic lesions noted in the visualized portions of the skeleton. IMPRESSION: 1. Study is positive for segmental sized pulmonary embolism in the left lower lobe (axial image 37 of series 2). 2. Resolution of large volume of malignant ascites noted on prior studies. There is a large amount of omental caking which persists on today's examination. 3. Resolution of previously noted pleural effusions with small amount of residual right-sided pleural thickening. 4. Cholelithiasis without evidence of acute cholecystitis at this time. 5. Aortic atherosclerosis. 6. Additional incidental findings, as above. These results will be called to the ordering clinician or representative by the Radiologist Assistant, and communication documented in the PACS or [employer]. Electronically Signed   By: Vinnie Langton M.D.   On: 03/05/2021 12:56   DG Chest Port 1 View  Result Date: 03/05/2021 CLINICAL DATA:  sob EXAM: PORTABLE CHEST 1 VIEW COMPARISON:  January 01, 2021 FINDINGS: The cardiomediastinal silhouette is unchanged in contour.RIGHT chest port with tip terminating over the SVC. No pleural effusion. No pneumothorax. No acute pleuroparenchymal abnormality. Visualized abdomen is unremarkable. Multilevel degenerative changes of the thoracic spine. IMPRESSION: No acute cardiopulmonary abnormality. LEFT lower lobe pulmonary embolism is not visualized radiographically. Electronically Signed   By: Valentino Saxon M.D.   On: 03/05/2021 17:29       Assessment:  Kathy Morton is a 76 y.o. female diagnosed with stage IV adenocarcinoma of Mullerian origin (pleural effusion; possible inguinal node mets) with evidence of response to NACT carbopaltin/paclitaxel/bevacizumab based on imaging, resolution of ascites/effusion, CT, and CA125.    Recent diagnosis of PE  Poor performance  status  Hypoalbuminemia, resolved.   H/o Anemia with Low folate, B12, and iron- supplementation provided   H/o proteinuria  History of incontinence  Elevated  B Natriuretic Peptide; ECHO - left ventricle has low normal function.  Medical co-morbidities complicating care: HTN; history of DVT (upon review with patient may have been superficial phlebitus). Body mass index is 25.69 kg/m.   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 At this point Ms. Toft is not interested in further therapy. She would prefer to stop treatment and focus on quality of life. I have spoken with Dr. Janese Banks who will arrange for follow up in the future. We will request an appt with Dr. Billey Chang, NP who will discuss palliative care and Hospice.   She is DNAR.   A total of 50 minutes were spent with the patient/family today; >50% was spent in education, counseling and coordination of care for stage IV adenocarcinoma of Mullerian origin.  I personally had a face to face interaction and evaluated the patient myself. I performed the physical exam and history.  Counseling was completed by me.   Jensen [place] Gaetana Michaelis, MD

## 2021-03-09 NOTE — Telephone Encounter (Signed)
I will call him today or tomorrow

## 2021-03-09 NOTE — Telephone Encounter (Signed)
Appointment can be put back on

## 2021-03-09 NOTE — Progress Notes (Signed)
[facility] Clinical Social Work  ?Initial Assessment ? ? ?Kathy Morton is a 76 y.o. year old female seen in inpatient room 101 . Clinical Social Work was referred by  [facility]  for assessment of psychosocial needs.  ? ?SDOH (Social Determinants of Health) assessments performed: Yes ?  ?Distress Screen completed: No ?ONCBCN DISTRESS SCREENING 01/12/2021  ?Screening Type Initial Screening  ?Distress experienced in past week (1-10) 0  ? ? ? ? ?Family/Social Information:  ?Housing Arrangement: patient lives alonepatient's three sons are active in assisting patient with getting to appointments. ?Family members/support persons in your life? Family ?Transportation concerns: no  ?Employment: Retired. Income source: Public affairs consultant and [employer] ?Financial concerns:  No current concerns.  Patient's son Kathy Morton expressed concerns about the patient's ability to care for herself, since she lives alone.  CSW discussed options for ALF, [place].  [name] stated she was not interested in living in a [facility] or ALF, patient expressed concern over affording a private duty caregiver. ?Type of concern: Care giving (Child care or elder care services) ?Food access concerns: no ?Religious or spiritual practice: no ?Services Currently in place:  [facility] Medicare ? ?Coping/ Adjustment to diagnosis: ?Patient understands treatment plan and what happens next? yes ?Concerns about diagnosis and/or treatment: Overwhelmed by information and How I will pay for the services I need ?Patient reported stressors: Children, Adjusting to my illness, Isolation/ feeling alone, Feeling hopeless, and concerns over long term care ?Hopes and priorities: [name] stated she would like to stay at home and is not interested in going to facility. ?Patient enjoys  N/A ?Current coping skills/ strengths: Financial means  and Supportive family/friends  ? ? ? SUMMARY: ?Current SDOH Barriers:  ?Financial constraints  related to fixed income, Limited social support, Level of care concerns, ADL IADL limitations, Social Isolation, and Limited access to caregiver ? ?Clinical Social Work Clinical Goal(s):  ?patient will work with [facility] RNCM to address needs related to home health needs.  Patient's son Kathy Morton stated he would discuss the options available to the patient for long-term care needs and make a decision to assist patient. ? ?Interventions: ?Discussed common feeling and emotions when being diagnosed with cancer, and the importance of support during treatment ?Informed patient of the support team roles and support services at [facility] ?Provided CSW contact information and encouraged patient to call with any questions or concerns ?Provided patient with information about CSW role in patient care and available [facility] resources. ? ? ?Follow Up Plan: Patient will contact CSW with any support or resource needs ?Patient verbalizes understanding of plan: Yes ? ?CSW met with patient and patient's son Kathy Morton in inpatient room 101 at [facility]  Kathy Morton was expressed concern over patient's increasing care needs and the patient's ability to care for herself.  The patient lives alone in [place] and her three sons live in [place].  Kathy Morton was not in agreement with the purposed [facility] discharge plan and wanted to know if the [facility] could do anything to assist with placement for the patient.  CSW explained the patient had been evaluated by PT and the recommendation was for home health.  Due to this recommendation the patient did not qualify for insurance to pay for SNF placement.  CSW, the patient and Kathy Morton explored the available options for discharge. Patient does not qualify for Medicaid, patient's income is well over the income guidelines for Medicaid. CSW went over the  different possible costs associated with ALF, [facility] and private caregivers.  Patient stated she was not interested in leaving her home.  CSW verbalized understanding and  stated, the two options left for the patient were private care giver and family support.  CSW also explained what home health entails and what they are able to offer for patient care. CSW clearly explained the difference between home health aides and private duty care givers.  Patient and Kathy Morton verbalized understanding.  CSW confirmed patient's 2:15PM appointment was still on the schedule and recommended the patient or Mr. Kathy Morton contact [facility] to reschedule the appointment, if the patient was not able to attend the appointment.  Mr. Kathy Morton and patient verbalized understanding. Patient and Mr. Kathy Morton has not further questions for CSW.  CSW updated TOC RNCM. ? ?Kathy Malveaux, LCSW ?

## 2021-03-09 NOTE — Telephone Encounter (Signed)
Patient's son would like to speak with Dr. Janese Banks as soon as possible. Patient is currently in hospital. ?

## 2021-03-14 ENCOUNTER — Encounter: Payer: Self-pay | Admitting: Oncology

## 2021-03-16 ENCOUNTER — Encounter: Payer: Self-pay | Admitting: Oncology

## 2021-03-16 ENCOUNTER — Other Ambulatory Visit: Payer: Self-pay

## 2021-03-16 ENCOUNTER — Inpatient Hospital Stay (HOSPITAL_BASED_OUTPATIENT_CLINIC_OR_DEPARTMENT_OTHER): Payer: Medicare PPO | Admitting: Hospice and Palliative Medicine

## 2021-03-16 DIAGNOSIS — D649 Anemia, unspecified: Secondary | ICD-10-CM | POA: Diagnosis not present

## 2021-03-16 DIAGNOSIS — C786 Secondary malignant neoplasm of retroperitoneum and peritoneum: Secondary | ICD-10-CM

## 2021-03-16 DIAGNOSIS — R180 Malignant ascites: Secondary | ICD-10-CM | POA: Diagnosis not present

## 2021-03-16 DIAGNOSIS — Z515 Encounter for palliative care: Secondary | ICD-10-CM | POA: Diagnosis not present

## 2021-03-16 DIAGNOSIS — I2699 Other pulmonary embolism without acute cor pulmonale: Secondary | ICD-10-CM | POA: Diagnosis not present

## 2021-03-16 DIAGNOSIS — C579 Malignant neoplasm of female genital organ, unspecified: Secondary | ICD-10-CM | POA: Diagnosis not present

## 2021-03-16 DIAGNOSIS — C763 Malignant neoplasm of pelvis: Secondary | ICD-10-CM

## 2021-03-16 DIAGNOSIS — I10 Essential (primary) hypertension: Secondary | ICD-10-CM | POA: Diagnosis not present

## 2021-03-16 DIAGNOSIS — Z9221 Personal history of antineoplastic chemotherapy: Secondary | ICD-10-CM | POA: Diagnosis not present

## 2021-03-16 DIAGNOSIS — Z66 Do not resuscitate: Secondary | ICD-10-CM | POA: Diagnosis not present

## 2021-03-16 MED ORDER — APIXABAN 5 MG PO TABS: 5.0000 mg | ORAL_TABLET | Freq: Two times a day (BID) | ORAL | 0 refills | Status: DC

## 2021-03-16 MED ORDER — CLONAZEPAM 1 MG PO TABS: 1.0000 mg | ORAL_TABLET | Freq: Every day | ORAL | 0 refills | Status: DC | PRN

## 2021-03-16 NOTE — Progress Notes (Signed)
? ?  ?Palliative Medicine ?[place] at [facility] ?Telephone:([phone]) [phone] Fax:(336) [phone] ? ? ?Name: Kathy Morton ?Date: 03/16/2021 ?MRN: [id number]  ?DOB: 08-08-1945 ? ?Patient Care Team: ?Hamrick, Lorin Mercy, MD as PCP - General (Family Medicine) ?Clent Jacks, RN as Oncology Nurse Navigator  ? ? ?REASON FOR CONSULTATION: ?Kathy Morton is a 76 y.o. female with multiple medical problems including  including stage IV high-grade serous carcinoma status post 3 cycles of carbo Taxol and Mvasi chemotherapy (started on 01/18/2021).  Patient was hospitalized 03/05/2021 to 03/08/2021 with PE.  She is referred to palliative care to help address goals and manage ongoing symptoms. ? ?SOCIAL HISTORY:    ? reports that she has never smoked. She has never used smokeless tobacco. She reports that she does not drink alcohol and does not use drugs. ? ?Patient is divorced.  She lives at home alone.  She has three sons who live nearby.  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 ? ?ADVANCE DIRECTIVES:  ?Living will on file ? ?CODE STATUS: DNR/DNI ? ?PAST MEDICAL HISTORY: ?Past Medical History:  ?Diagnosis Date  ? Anemia, folate and B12 deficiency 01/01/2021  ? Anxiety   ? Depression   ? DVT of leg (deep venous thrombosis) ([place])   ? Dysuria   ? Family history of bladder cancer   ? Family history of breast cancer   ? History of blood clots   ? Hypertension   ? Incontinence   ? Migraine   ? Recurrent UTI   ? 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 ? ?PAST SURGICAL HISTORY:  ?Past Surgical History:  ?Procedure Laterality Date  ? BREAST BIOPSY Left 01/06/13  ? benign finding of sclerosing adenosis  ? COLONOSCOPY WITH PROPOFOL N/A 11/29/2015  ? Procedure: COLONOSCOPY WITH PROPOFOL;  Surgeon: Jonathon Bellows, MD;  Location: [facility] ENDOSCOPY;  Service: Endoscopy;  Laterality: N/A;  ? IR IMAGING GUIDED PORT INSERTION  01/17/2021  ? IR PARACENTESIS  01/17/2021  ? KNEE  SURGERY Left 2010  ? torn meniscus  ? TONSILECTOMY, ADENOIDECTOMY, BILATERAL MYRINGOTOMY AND TUBES    ? ? ?HEMATOLOGY/ONCOLOGY HISTORY:  ?Oncology History  ?Malignant neoplasm of ovary ([place])  ?01/13/2021 Initial Diagnosis  ? Malignant neoplasm of ovary ([place]) ?  ?01/13/2021 Cancer Staging  ? Staging form: Ovary, Fallopian Tube, and Primary Peritoneal Carcinoma, AJCC 8th Edition ?- Clinical stage from 01/13/2021: Stage IVB (cTX, cNX, cM1b) - Signed by Sindy Guadeloupe, MD on 01/13/2021 ?  ?01/18/2021 -  Chemotherapy  ? Patient is on Treatment Plan : OVARIAN Carboplatin (AUC 6) / Paclitaxel (175) q21d x 6 cycles  ?   ? Genetic Testing  ? Negative genetic testing. No pathogenic variants identified on the [facility] Multi-Cancer+RNA panel (germline testing). 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 The report date is 02/21/2021. ? ?The Multi-Cancer Panel + RNA offered by [facility] includes sequencing and/or deletion duplication testing of the following 84 genes: AIP, ALK, APC, ATM, AXIN2,BAP1,  BARD1, BLM, BMPR1A, BRCA1, BRCA2, BRIP1, CASR, CDC73, CDH1, CDK4, CDKN1B, CDKN1C, CDKN2A (p14ARF), CDKN2A (p16INK4a), CEBPA, CHEK2, CTNNA1, DICER1, DIS3L2, EGFR (c.2369C>T, p.Thr790Met variant only), EPCAM (Deletion/duplication testing only), FH, FLCN, GATA2, GPC3, GREM1 (Promoter region deletion/duplication testing only), HOXB13 (c.251G>A, p.Gly84Glu), HRAS, KIT, MAX, MEN1, MET, MITF (c.952G>A, p.Glu318Lys variant only), MLH1, MSH2, MSH3, MSH6, MUTYH, NBN, NF1, NF2, NTHL1, PALB2, PDGFRA, PHOX2B, PMS2, POLD1, POLE, POT1, PRKAR1A, PTCH1, PTEN, RAD50, RAD51C, RAD51D, RB1, RECQL4, RET, RUNX1, SDHAF2,  SDHA (sequence changes only), SDHB, SDHC, SDHD, SMAD4, SMARCA4, SMARCB1, SMARCE1, STK11, SUFU, TERC, TERT, TMEM127, TP53, TSC1, TSC2, VHL, WRN and WT1. ? ?Negative HRD testing (somatic) through [facility]. The report date is 02/14/2021. ?  ? ? ?ALLERGIES:  has No Known Allergies. ? ?MEDICATIONS:  ?Current Outpatient Medications  ?Medication  Sig Dispense Refill  ? acetaminophen (TYLENOL) 500 MG tablet Take 500 mg by mouth every 6 (six) hours as needed for headache, fever or mild pain.    ? alendronate (FOSAMAX) 70 MG tablet Take 70 mg by mouth once a week. Take with a full glass of water on an empty stomach. (Patient not taking: Reported on 03/09/2021)    ? apixaban (ELIQUIS) 5 MG TABS tablet Take 2 tablets (10 mg total) by mouth 2 (two) times daily for 5 days. 20 tablet 0  ? apixaban (ELIQUIS) 5 MG TABS tablet Take 1 tablet (5 mg total) by mouth 2 (two) times daily. 60 tablet 0  ? atorvastatin (LIPITOR) 20 MG tablet TAKE 1 TABLET BY MOUTH EVERY DAY (Patient not taking: Reported on 03/09/2021) 90 tablet 0  ? clonazePAM (KLONOPIN) 1 MG tablet Take 1 mg by mouth daily as needed for anxiety.    ? DULoxetine (CYMBALTA) 60 MG capsule Take 1 capsule (60 mg total) by mouth daily. 30 capsule 3  ? folic acid (FOLVITE) 1 MG tablet Take 1 tablet (1 mg total) by mouth daily. 30 tablet 2  ? lidocaine-prilocaine (EMLA) cream Apply to affected area once 30 g 3  ? loratadine (CLARITIN) 10 MG tablet Take 10 mg by mouth daily.    ? LORazepam (ATIVAN) 0.5 MG tablet TAKE 1 TABLET (0.5 MG TOTAL) BY MOUTH EVERY 6 (SIX) HOURS AS NEEDED (NAUSEA OR VOMITING). (Patient not taking: Reported on 03/09/2021) 30 tablet 0  ? QUEtiapine (SEROQUEL) 100 MG tablet Take 1 tablet (100 mg total) by mouth at bedtime. 90 tablet 1  ? SUMAtriptan (IMITREX) 100 MG tablet May repeat in 2 hours if headache persists or recurs. 9 tablet 5  ? ?No current facility-administered medications for this visit.  ? ?Facility-Administered Medications Ordered in Other Visits  ?Medication Dose Route Frequency Provider Last Rate Last Admin  ? heparin lock flush 100 UNIT/ML injection           ? ? ?VITAL SIGNS: ?There were no vitals taken for this visit. ?There were no vitals filed for this visit.  ?Estimated body mass index is 25.69 kg/m? as calculated from the following: ?  Height as of 03/06/21: _0  (1.6 m). ?  Weight  as of 03/09/21: 145 lb (65.8 kg). ? ?LABS: ?CBC: ?   ?Component Value Date/Time  ? WBC 8.4 03/07/2021 0622  ? HGB 13.8 03/07/2021 0622  ? HCT 40.5 03/07/2021 0622  ? PLT 212 03/07/2021 0622  ? MCV 92.7 03/07/2021 0622  ? NEUTROABS 5.7 03/07/2021 0622  ? LYMPHSABS 2.3 03/07/2021 0622  ? MONOABS 0.1 03/07/2021 0622  ? EOSABS 0.2 03/07/2021 0622  ? BASOSABS 0.1 03/07/2021 0622  ? ?Comprehensive Metabolic Panel: ?   ?Component Value Date/Time  ? NA 131 (L) 03/08/2021 0814  ? NA 135 03/10/2015 0923  ? K 3.8 03/08/2021 0814  ? CL 98 03/08/2021 0814  ? CO2 23 03/08/2021 0814  ? BUN 17 03/08/2021 0814  ? BUN 11 03/10/2015 0923  ? CREATININE 0.59 03/08/2021 0814  ? CREATININE 0.97 (H) 10/20/2016 0844  ? GLUCOSE 114 (H) 03/08/2021 8295  ? CALCIUM 9.2 03/08/2021 0814  ?  AST 25 03/01/2021 0746  ? ALT 27 03/01/2021 0746  ? ALKPHOS 69 03/01/2021 0746  ? BILITOT 0.4 03/01/2021 0746  ? BILITOT <0.2 11/11/2014 1624  ? PROT 7.0 03/01/2021 0746  ? PROT 7.3 11/11/2014 1624  ? ALBUMIN 3.9 03/01/2021 0746  ? ALBUMIN 4.7 11/11/2014 1624  ? ? ?RADIOGRAPHIC STUDIES: ?CT CHEST ABDOMEN PELVIS W CONTRAST ? ?Result Date: 03/05/2021 ?CLINICAL DATA:  76 year old female with history of ovarian cancer (serous carcinoma). Follow-up study. EXAM: CT CHEST, ABDOMEN, AND PELVIS WITH CONTRAST TECHNIQUE: Multidetector CT imaging of the chest, abdomen and pelvis was performed following the standard protocol during bolus administration of intravenous contrast. RADIATION DOSE REDUCTION: This exam was performed according to the departmental dose-optimization program which includes automated exposure control, adjustment of the mA and/or kV according to patient size and/or use of iterative reconstruction technique. CONTRAST:  133m OMNIPAQUE IOHEXOL 300 MG/ML  SOLN COMPARISON:  PET-CT 01/27/2021. CT of the chest, abdomen and pelvis 01/01/2021. FINDINGS: CT CHEST FINDINGS Cardiovascular: In the left lower lobe (axial image 37 of series 2) there is a filling defect  in a segmental sized pulmonary artery, compatible with pulmonary embolism. Heart size is mildly enlarged. There is no significant pericardial fluid, thickening or pericardial calcification. Aortic atheroscl

## 2021-03-22 ENCOUNTER — Inpatient Hospital Stay: Payer: Medicare PPO | Admitting: Oncology

## 2021-03-22 ENCOUNTER — Other Ambulatory Visit: Payer: Medicare PPO

## 2021-03-22 ENCOUNTER — Ambulatory Visit: Payer: Medicare PPO

## 2021-03-23 ENCOUNTER — Other Ambulatory Visit: Payer: Self-pay | Admitting: *Deleted

## 2021-03-23 ENCOUNTER — Other Ambulatory Visit: Payer: Self-pay | Admitting: Hospice and Palliative Medicine

## 2021-03-23 ENCOUNTER — Ambulatory Visit: Payer: Medicare PPO

## 2021-03-23 MED ORDER — DULOXETINE HCL 60 MG PO CPEP
60.0000 mg | ORAL_CAPSULE | Freq: Every day | ORAL | 1 refills | Status: DC
Start: 2021-03-23 — End: 2021-09-12

## 2021-03-23 MED ORDER — HYDROCODONE-ACETAMINOPHEN 5-325 MG PO TABS: 1.0000 | ORAL_TABLET | Freq: Four times a day (QID) | ORAL | 0 refills | Status: DC | PRN

## 2021-03-23 NOTE — Progress Notes (Signed)
Hospice requested refill of Norco ?

## 2021-04-05 ENCOUNTER — Other Ambulatory Visit: Payer: Medicare PPO

## 2021-04-20 ENCOUNTER — Ambulatory Visit: Payer: Medicare PPO

## 2021-04-27 ENCOUNTER — Telehealth: Payer: Self-pay | Admitting: Hospice and Palliative Medicine

## 2021-04-27 NOTE — Telephone Encounter (Signed)
Hospice nurse, Amy, called to report urinary incontinence.  Sounds like mixed type but chronic and predating her cancer.  Discussed with GYN oncology and will trial Ditropan XR 5 mg daily ?

## 2021-05-02 ENCOUNTER — Other Ambulatory Visit: Payer: Medicare PPO

## 2021-05-02 ENCOUNTER — Other Ambulatory Visit: Payer: Self-pay | Admitting: Hospice and Palliative Medicine

## 2021-05-02 MED ORDER — CLONAZEPAM 1 MG PO TABS: 1.0000 mg | ORAL_TABLET | Freq: Every day | ORAL | 0 refills | Status: DC | PRN

## 2021-05-04 ENCOUNTER — Ambulatory Visit: Payer: Medicare PPO

## 2021-05-18 ENCOUNTER — Other Ambulatory Visit: Payer: Self-pay | Admitting: Hospice and Palliative Medicine

## 2021-05-18 MED ORDER — CLONAZEPAM 1 MG PO TABS: 1.0000 mg | ORAL_TABLET | Freq: Two times a day (BID) | ORAL | 0 refills | Status: DC | PRN

## 2021-05-18 NOTE — Progress Notes (Signed)
I received a call from hospice nurse, Amy.  Reportedly, patient has had worse anxiety recently uncontrolled with nighttime dosing of clonazepam.  We will increase clonazepam to twice daily dosing.  Refill sent to pharmacy. ?

## 2021-06-23 ENCOUNTER — Other Ambulatory Visit: Payer: Self-pay | Admitting: Oncology

## 2021-06-23 DIAGNOSIS — C569 Malignant neoplasm of unspecified ovary: Secondary | ICD-10-CM

## 2021-07-07 ENCOUNTER — Telehealth: Payer: Self-pay | Admitting: Hospice and Palliative Medicine

## 2021-07-07 ENCOUNTER — Telehealth: Payer: Self-pay

## 2021-07-07 DIAGNOSIS — C786 Secondary malignant neoplasm of retroperitoneum and peritoneum: Secondary | ICD-10-CM

## 2021-07-07 NOTE — Telephone Encounter (Signed)
1400-RN returned phone call for patient- discussed that paracentesis can be performed at 1230 pm tomorrow. Confirmed apt with patient. She thanked me for calling her

## 2021-07-07 NOTE — Telephone Encounter (Signed)
Received a call from hospice nurse, Amy. Patient reports worsening abdominal distention and weight gain over the past week or so.  No nausea or vomiting reported.  She is reportedly having bowel movements and remains quite functional.  Concern is for recurrent ascites.  Last therapeutic paracentesis was in January at which time over 9 L was removed.  We will schedule patient for repeat therapeutic paracentesis.  Of note, patient is on Eliquis.  Will defer to IR regarding decision to hold this.  Contact information for patient: [phone]

## 2021-07-07 NOTE — Telephone Encounter (Signed)
67 am Rn left a vm for patient to return my phone call at [phone]. Also attempted to reach patient via home phone, but home phone just rings.  I spoke with Marcie Bal in IR scheduling. Para can be performed either today at 2pm or tomorrow at 1230 pm.

## 2021-07-07 NOTE — Telephone Encounter (Signed)
Patient called back requesting to speak with Nira Conn RN about setting up for a procedure.  Callback # [phone]

## 2021-07-08 ENCOUNTER — Ambulatory Visit
Admission: RE | Admit: 2021-07-08 | Discharge: 2021-07-08 | Disposition: A | Discharge status: Home / Self Care | Source: Ambulatory Visit | Attending: Hospice and Palliative Medicine | Admitting: Hospice and Palliative Medicine

## 2021-07-08 ENCOUNTER — Other Ambulatory Visit: Payer: Self-pay | Admitting: Hospice and Palliative Medicine

## 2021-07-08 DIAGNOSIS — C786 Secondary malignant neoplasm of retroperitoneum and peritoneum: Secondary | ICD-10-CM

## 2021-07-08 DIAGNOSIS — R188 Other ascites: Secondary | ICD-10-CM | POA: Diagnosis not present

## 2021-08-02 ENCOUNTER — Other Ambulatory Visit: Payer: Self-pay | Admitting: Hospice and Palliative Medicine

## 2021-08-02 ENCOUNTER — Other Ambulatory Visit: Payer: Self-pay | Admitting: *Deleted

## 2021-08-02 MED ORDER — CLONAZEPAM 1 MG PO TABS: 1.0000 mg | ORAL_TABLET | Freq: Two times a day (BID) | ORAL | 1 refills | Status: DC | PRN

## 2021-08-02 MED ORDER — MORPHINE SULFATE (CONCENTRATE) 10 MG /0.5 ML PO SOLN: 5.0000 mg | ORAL | 0 refills | Status: DC | PRN

## 2021-08-02 NOTE — Progress Notes (Signed)
I spoke with hospice nurse, Amy. Patient has increased weight (~30lbs) over the past month with worsening BLE edema and now some shortness of breath. Patient has Lasix in the home but is not currently taking it. Recommended to trial Lasix '20mg'$  x 3 days and assess daily weights. Will also send morphine for comfort per hospice request.

## 2021-08-05 ENCOUNTER — Telehealth: Payer: Self-pay | Admitting: Hospice and Palliative Medicine

## 2021-08-05 NOTE — Telephone Encounter (Signed)
Received a call from hospice nurse, Amy.  Patient has dropped about 10 pounds down to a weight around 190 since restarting Lasix.  Her edema is much improved and she feels much better.  We discussed leaving Lasix as needed.  I recommended hospice check a BMP next week and consider potassium supplementation if needed.

## 2021-08-11 ENCOUNTER — Encounter: Payer: Self-pay | Admitting: Oncology

## 2021-08-12 ENCOUNTER — Telehealth: Payer: Self-pay | Admitting: Hospice and Palliative Medicine

## 2021-08-12 ENCOUNTER — Encounter: Payer: Self-pay | Admitting: Oncology

## 2021-08-12 NOTE — Telephone Encounter (Signed)
BMP reviewed.  Potassium 4.5.  Discussed with hospice nurse.  Patient has had improvement in symptoms/edema on 3 times weekly furosemide.  May continue furosemide as needed in addition to K-Dur 20 mill equivalents when she takes her Lasix.  Would monitor BMP weekly for couple weeks to ensure that electrolytes are stable.

## 2021-08-16 ENCOUNTER — Telehealth: Payer: Self-pay | Admitting: Hospice and Palliative Medicine

## 2021-08-16 NOTE — Telephone Encounter (Signed)
I spoke with hospice nurse, Amy.  Patient has had symptomatic improvement while taking furosemide with reduced edema/shortness of breath.  However, she has not tolerated intermittent dosing as symptoms recur when patient is off the furosemide.  Okay to take daily furosemide as needed.  Would also recommend daily K-Dur dosing and intermittent checking of BMP

## 2021-08-18 ENCOUNTER — Other Ambulatory Visit: Payer: Self-pay | Admitting: *Deleted

## 2021-08-18 MED ORDER — ACETAMINOPHEN 500 MG PO TABS: 1000.0000 mg | ORAL_TABLET | Freq: Three times a day (TID) | ORAL | 2 refills | Status: DC | PRN

## 2021-08-22 ENCOUNTER — Encounter: Payer: Self-pay | Admitting: Hospice and Palliative Medicine

## 2021-08-24 ENCOUNTER — Encounter: Payer: Self-pay | Admitting: Hospice and Palliative Medicine

## 2021-09-01 ENCOUNTER — Encounter: Payer: Self-pay | Admitting: Oncology

## 2021-09-02 ENCOUNTER — Encounter: Payer: Self-pay | Admitting: Oncology

## 2021-09-06 ENCOUNTER — Telehealth: Payer: Self-pay | Admitting: *Deleted

## 2021-09-06 NOTE — Telephone Encounter (Signed)
I spoke with patient's son.  He had some questions regarding repeat imaging to assist with prognostication as patient is really doing quite well.  He reiterates that patient and family goal remains comfort.  For that reason, I would encourage continued hospice care and patient would likely not benefit significantly from repeat work-up at this point.  All questions answered.  Also spoke with hospice nurse, Amy.

## 2021-09-06 NOTE — Telephone Encounter (Signed)
Son Nicki Reaper called stating hat patient has had labs drawn and wants to know why and what is being looked for. He is also concerned about the results of said labs and would like to speak with Sharion Dove, NP

## 2021-09-07 ENCOUNTER — Encounter: Payer: Self-pay | Admitting: Oncology

## 2021-09-08 ENCOUNTER — Other Ambulatory Visit: Payer: Self-pay | Admitting: *Deleted

## 2021-09-08 DIAGNOSIS — F411 Generalized anxiety disorder: Secondary | ICD-10-CM

## 2021-09-08 DIAGNOSIS — C801 Malignant (primary) neoplasm, unspecified: Secondary | ICD-10-CM

## 2021-09-12 ENCOUNTER — Encounter: Payer: Self-pay | Admitting: Oncology

## 2021-09-12 MED ORDER — DULOXETINE HCL 60 MG PO CPEP: 60.0000 mg | ORAL_CAPSULE | Freq: Every day | ORAL | 1 refills | Status: DC

## 2021-09-12 NOTE — Telephone Encounter (Signed)
Hospice pt- needs RF on cymbalta

## 2021-09-13 ENCOUNTER — Telehealth: Payer: Self-pay | Admitting: *Deleted

## 2021-09-13 DIAGNOSIS — C786 Secondary malignant neoplasm of retroperitoneum and peritoneum: Secondary | ICD-10-CM

## 2021-09-13 DIAGNOSIS — E876 Hypokalemia: Secondary | ICD-10-CM

## 2021-09-13 NOTE — Telephone Encounter (Signed)
Received incoming request for potassium refill. Please advise.

## 2021-09-14 ENCOUNTER — Encounter: Payer: Self-pay | Admitting: Oncology

## 2021-09-14 MED ORDER — POTASSIUM CHLORIDE CRYS ER 20 MEQ PO TBCR: 20.0000 meq | EXTENDED_RELEASE_TABLET | Freq: Every day | ORAL | 1 refills | Status: DC

## 2021-09-14 NOTE — Telephone Encounter (Signed)
Spoke with Amy - most likely unable to get a blood return on pt's metb w/o hemolyzing. Port is no longer able to work due to the amt of chest wall swelling. Amy called me back 15 mins later. Stated that she spoke with Merrily Pew, NP already. Lasix does not seem to be working at this time. Will d/c the lasix. No need for blood draws at this time per NP/ hospice nurse.

## 2021-09-14 NOTE — Telephone Encounter (Signed)
I spoke with Hospice triage w/ [facility]. She will leave a msg for Amy to contact me at the cancer center to discuss pt's care. Msg given to triage to order metb to recheck pt's potassium. Ok to rf if patient is still taking lasix. Per triage nurse, appears that patient is still taking lasix and potassium. Most likely, pt just needs a RF for the potassium.

## 2021-10-05 ENCOUNTER — Telehealth: Payer: Self-pay | Admitting: Hospice and Palliative Medicine

## 2021-10-05 NOTE — Telephone Encounter (Signed)
Received call from hospice nurse, Amy.  Reportedly, patient is up to 200 pounds (dry weight 160) with generalized edema and crackles in the bases of lungs.  She had self discontinued Lasix after not finding much benefit from 40 mg daily dosing.  Recommended restarting furosemide 40 mg twice daily x3 days.  Could try torsemide if ineffective.  Assess weights and vitals and recheck BMP next week.

## 2021-10-10 ENCOUNTER — Telehealth: Payer: Self-pay | Admitting: Hospice and Palliative Medicine

## 2021-10-10 ENCOUNTER — Telehealth: Payer: Self-pay | Admitting: *Deleted

## 2021-10-10 DIAGNOSIS — C786 Secondary malignant neoplasm of retroperitoneum and peritoneum: Secondary | ICD-10-CM

## 2021-10-10 NOTE — Telephone Encounter (Signed)
Msg rcvd from Merrily Pew, NP  Nira Conn - will you please help coordinate lab encounter for BMP on Wednesday. Apparently, patient's preference is 11:30 if that is possible

## 2021-10-10 NOTE — Telephone Encounter (Signed)
Attempted to reach pt/pt's family-apt provided for Wednesday 10/12/21- left vm for patient/family

## 2021-10-10 NOTE — Telephone Encounter (Signed)
Lab encounter added. Left vm for patient regarding apt time

## 2021-10-10 NOTE — Telephone Encounter (Signed)
Lab apt scheduled for 1145 am on 10/12/21

## 2021-10-10 NOTE — Telephone Encounter (Signed)
I spoke with hospice nurse, Amy.  Patient has felt significantly improved with about 7 to 8 pounds weight loss on furosemide 40 mg twice daily.  Patient has restarted oral K.  Previous attempts to check BMP at home have resulted in hemolyzed sample.  Patient would like to schedule lab encounter here for follow-up labs.  RN has requested Wednesday at 1130 if possible.

## 2021-10-12 ENCOUNTER — Inpatient Hospital Stay: Attending: Hospice and Palliative Medicine

## 2021-10-12 DIAGNOSIS — C786 Secondary malignant neoplasm of retroperitoneum and peritoneum: Secondary | ICD-10-CM

## 2021-10-12 DIAGNOSIS — C763 Malignant neoplasm of pelvis: Secondary | ICD-10-CM | POA: Diagnosis not present

## 2021-10-12 LAB — BASIC METABOLIC PANEL
Anion gap: 9 (ref 5–15)
BUN: 28 mg/dL — ABNORMAL HIGH (ref 8–23)
CO2: 27 mmol/L (ref 22–32)
Calcium: 9.5 mg/dL (ref 8.9–10.3)
Chloride: 96 mmol/L — ABNORMAL LOW (ref 98–111)
Creatinine, Ser: 0.97 mg/dL (ref 0.44–1.00)
GFR, Estimated: >60 mL/min (ref >60)
Glucose, Bld: 106 mg/dL — ABNORMAL HIGH (ref 70–99)
Potassium: 4.3 mmol/L (ref 3.5–5.1)
Sodium: 132 mmol/L — ABNORMAL LOW (ref 135–145)

## 2021-10-13 ENCOUNTER — Other Ambulatory Visit: Payer: Self-pay | Admitting: *Deleted

## 2021-10-13 DIAGNOSIS — C801 Malignant (primary) neoplasm, unspecified: Secondary | ICD-10-CM

## 2021-10-13 DIAGNOSIS — C786 Secondary malignant neoplasm of retroperitoneum and peritoneum: Secondary | ICD-10-CM

## 2021-10-13 DIAGNOSIS — E876 Hypokalemia: Secondary | ICD-10-CM

## 2021-10-13 NOTE — Telephone Encounter (Addendum)
[facility] request for klor-con and cymbalta

## 2021-10-14 ENCOUNTER — Encounter: Payer: Self-pay | Admitting: Oncology

## 2021-10-14 MED ORDER — POTASSIUM CHLORIDE CRYS ER 20 MEQ PO TBCR: 20.0000 meq | EXTENDED_RELEASE_TABLET | Freq: Every day | ORAL | 1 refills | Status: DC

## 2021-10-14 MED ORDER — DULOXETINE HCL 60 MG PO CPEP: 60.0000 mg | ORAL_CAPSULE | Freq: Every day | ORAL | 1 refills | Status: DC

## 2021-11-03 ENCOUNTER — Encounter: Payer: Self-pay | Admitting: Oncology

## 2021-11-28 ENCOUNTER — Other Ambulatory Visit: Payer: Self-pay | Admitting: *Deleted

## 2021-11-28 DIAGNOSIS — C801 Malignant (primary) neoplasm, unspecified: Secondary | ICD-10-CM

## 2021-11-28 MED ORDER — DULOXETINE HCL 60 MG PO CPEP: 60.0000 mg | ORAL_CAPSULE | Freq: Every day | ORAL | 1 refills | Status: DC

## 2021-11-28 NOTE — Telephone Encounter (Signed)
Pharmacy sent RF request for Cymbalta

## 2022-01-03 ENCOUNTER — Other Ambulatory Visit: Payer: Self-pay | Admitting: Hospice and Palliative Medicine

## 2022-01-03 MED ORDER — MORPHINE SULFATE (CONCENTRATE) 10 MG /0.5 ML PO SOLN: 5.0000 mg | ORAL | 0 refills | Status: DC | PRN

## 2022-01-03 NOTE — Progress Notes (Signed)
Hospice RN, Amy, requested refill of morphine. She feels patient is declining.

## 2022-01-12 ENCOUNTER — Encounter: Payer: Self-pay | Admitting: Oncology

## 2022-02-02 DEATH — deceased

## 2023-11-06 IMAGING — US IR PARACENTESIS
2 series · 3 of 3 positions shown · non-contrast
Comparison: none

INDICATION: malignant ascites

[Series 1: ir paracentesis · 0.07mm/px · 2 of 2 slices shown (1 of 2)]
[im 1/2]
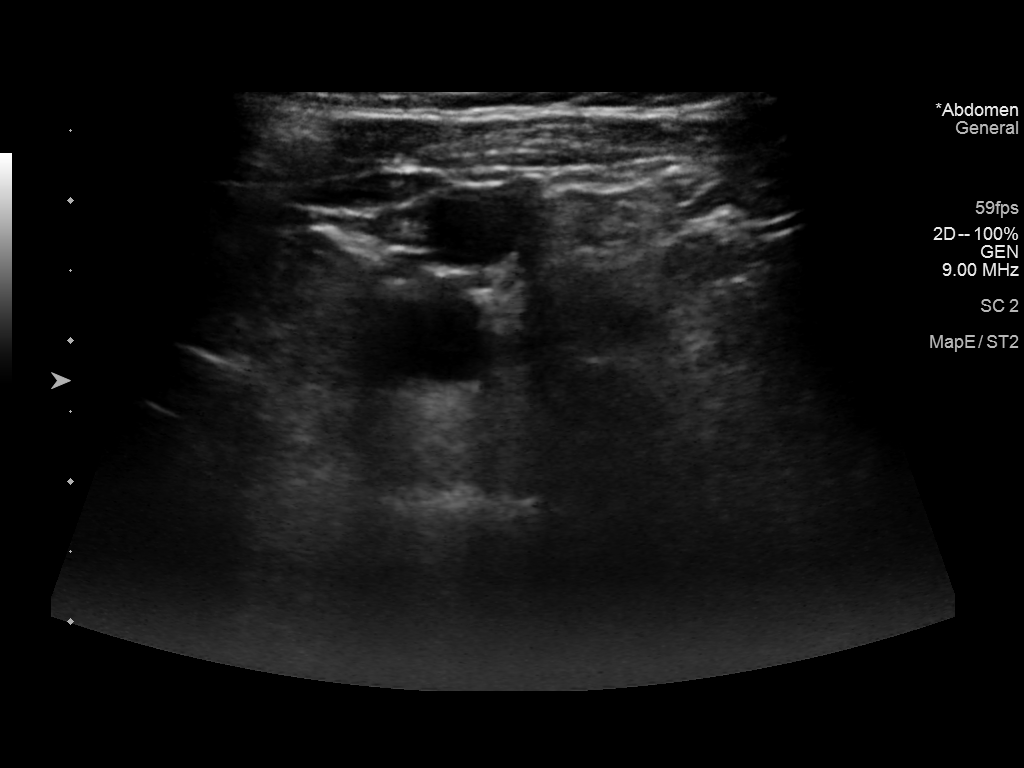
[im 2/2]
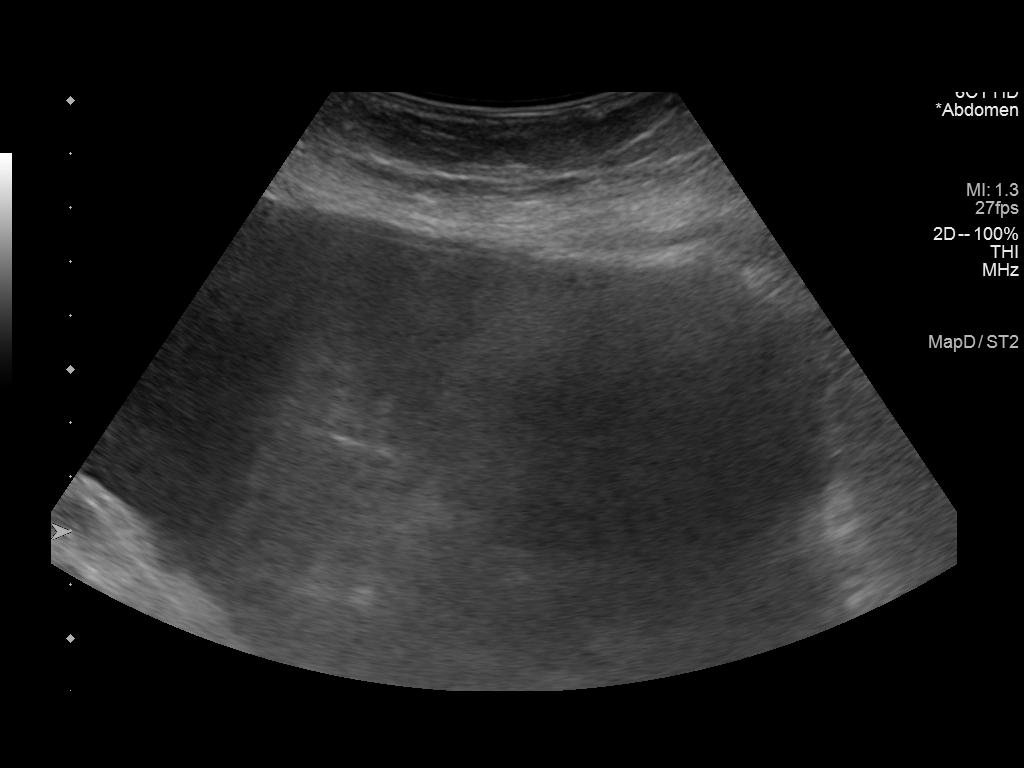

[Series 2: ir paracentesis · 0.07mm/px · 1 of 1 slices shown (2 of 2)]
[im 1/1]
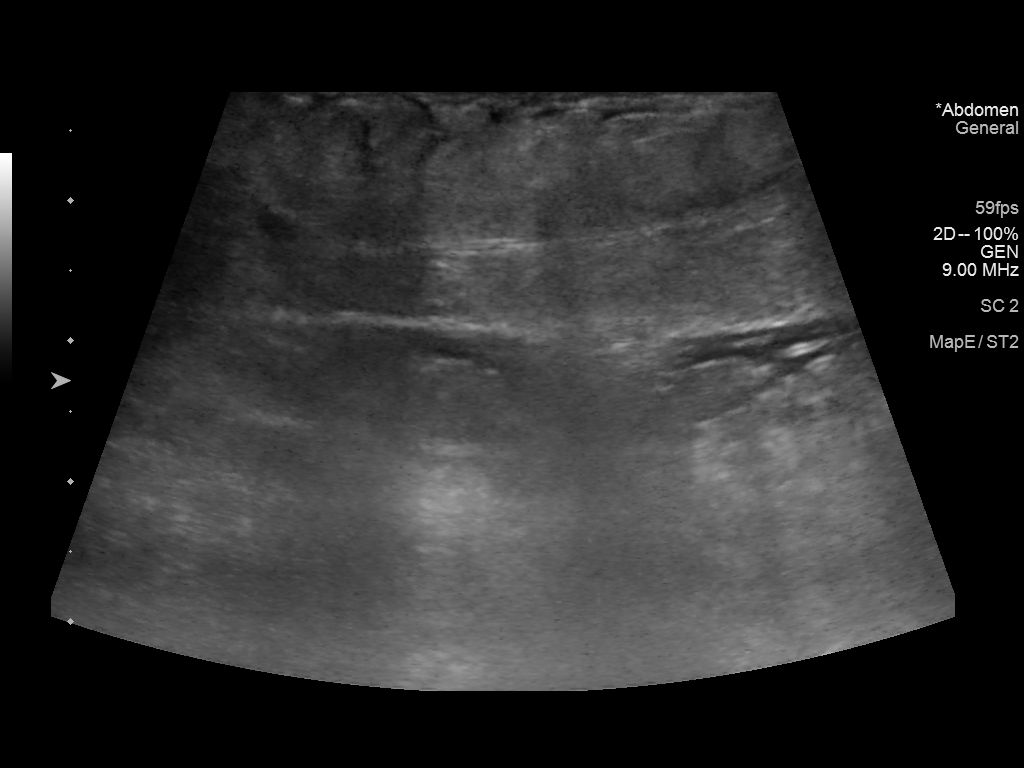

[3 of 3 positions shown; findings below may reference images not displayed]

EXAM:
ULTRASOUND GUIDED  PARACENTESIS

MEDICATIONS:
None.

COMPLICATIONS:
None immediate.

PROCEDURE:
Informed written consent was obtained from the patient after a
discussion of the risks, benefits and alternatives to treatment. A
timeout was performed prior to the initiation of the procedure.

Initial ultrasound scanning demonstrates a large amount of ascites
within the right lower abdominal quadrant. The right lower abdomen
was prepped and draped in the usual sterile fashion. 1% lidocaine
was used for local anesthesia.

Following this, a 19 gauge 10 cm Yueh catheter was introduced. An
ultrasound image was saved for documentation purposes. The
paracentesis was performed. The catheter was removed and a dressing
was applied. The patient tolerated the procedure well without
immediate post procedural complication.
FINDINGS: A total of approximately 9.8 L of clear, dark amber ascites fluid
was removed.
IMPRESSION: Successful ultrasound-guided paracentesis yielding 9.8 L of
peritoneal fluid.
